# Patient Record
Sex: Female | Born: 1945 | Race: White | Hispanic: No | Marital: Married | Smoking: Former smoker
Health system: Southern US, Community
[De-identification: ages and names within clinical notes are randomized; demographics above are authoritative.]

## PROBLEM LIST (undated history)

## (undated) DIAGNOSIS — F329 Major depressive disorder, single episode, unspecified: Secondary | ICD-10-CM

## (undated) DIAGNOSIS — E079 Disorder of thyroid, unspecified: Secondary | ICD-10-CM

## (undated) DIAGNOSIS — E119 Type 2 diabetes mellitus without complications: Secondary | ICD-10-CM

## (undated) DIAGNOSIS — E785 Hyperlipidemia, unspecified: Secondary | ICD-10-CM

## (undated) DIAGNOSIS — F32A Depression, unspecified: Secondary | ICD-10-CM

## (undated) HISTORY — DX: Major depressive disorder, single episode, unspecified: F32.9

## (undated) HISTORY — DX: Type 2 diabetes mellitus without complications: E11.9

## (undated) HISTORY — PX: ANKLE FRACTURE SURGERY: SHX122

## (undated) HISTORY — DX: Depression, unspecified: F32.A

## (undated) HISTORY — DX: Hyperlipidemia, unspecified: E78.5

## (undated) HISTORY — DX: Disorder of thyroid, unspecified: E07.9

---

## 1999-05-21 ENCOUNTER — Other Ambulatory Visit: Admission: RE | Admit: 1999-05-21 | Discharge: 1999-05-21 | Payer: Self-pay | Admitting: Obstetrics and Gynecology

## 2000-06-15 ENCOUNTER — Other Ambulatory Visit: Admission: RE | Admit: 2000-06-15 | Discharge: 2000-06-15 | Payer: Self-pay | Admitting: Obstetrics and Gynecology

## 2001-04-08 ENCOUNTER — Emergency Department (HOSPITAL_COMMUNITY): Admission: EM | Admit: 2001-04-08 | Discharge: 2001-04-08 | Payer: Self-pay | Admitting: Emergency Medicine

## 2001-06-19 ENCOUNTER — Other Ambulatory Visit: Admission: RE | Admit: 2001-06-19 | Discharge: 2001-06-19 | Payer: Self-pay | Admitting: Obstetrics and Gynecology

## 2002-07-18 ENCOUNTER — Other Ambulatory Visit: Admission: RE | Admit: 2002-07-18 | Discharge: 2002-07-18 | Payer: Self-pay | Admitting: Obstetrics and Gynecology

## 2003-11-25 ENCOUNTER — Other Ambulatory Visit: Admission: RE | Admit: 2003-11-25 | Discharge: 2003-11-25 | Payer: Self-pay | Admitting: Obstetrics and Gynecology

## 2003-11-27 ENCOUNTER — Encounter: Admission: RE | Admit: 2003-11-27 | Discharge: 2003-11-27 | Payer: Self-pay | Admitting: Obstetrics and Gynecology

## 2004-12-25 ENCOUNTER — Other Ambulatory Visit: Admission: RE | Admit: 2004-12-25 | Discharge: 2004-12-25 | Payer: Self-pay | Admitting: Obstetrics and Gynecology

## 2006-01-20 ENCOUNTER — Encounter: Admission: RE | Admit: 2006-01-20 | Discharge: 2006-01-20 | Payer: Self-pay | Admitting: Obstetrics and Gynecology

## 2006-08-05 ENCOUNTER — Encounter: Admission: RE | Admit: 2006-08-05 | Discharge: 2006-08-05 | Payer: Self-pay | Admitting: Endocrinology

## 2012-01-17 ENCOUNTER — Other Ambulatory Visit: Payer: Self-pay | Admitting: Family Medicine

## 2012-01-17 DIAGNOSIS — Z1231 Encounter for screening mammogram for malignant neoplasm of breast: Secondary | ICD-10-CM

## 2012-02-08 ENCOUNTER — Ambulatory Visit
Admission: RE | Admit: 2012-02-08 | Discharge: 2012-02-08 | Disposition: A | Discharge status: Home / Self Care | Payer: Medicare Other | Source: Ambulatory Visit | Attending: Family Medicine | Admitting: Family Medicine

## 2012-02-08 DIAGNOSIS — Z1231 Encounter for screening mammogram for malignant neoplasm of breast: Secondary | ICD-10-CM

## 2013-01-17 ENCOUNTER — Other Ambulatory Visit: Payer: Self-pay

## 2013-01-17 DIAGNOSIS — Z1231 Encounter for screening mammogram for malignant neoplasm of breast: Secondary | ICD-10-CM

## 2013-02-09 ENCOUNTER — Ambulatory Visit: Payer: Medicare Other

## 2013-05-01 ENCOUNTER — Other Ambulatory Visit: Payer: Self-pay | Admitting: Family Medicine

## 2013-05-01 DIAGNOSIS — C770 Secondary and unspecified malignant neoplasm of lymph nodes of head, face and neck: Secondary | ICD-10-CM

## 2013-05-01 DIAGNOSIS — R0989 Other specified symptoms and signs involving the circulatory and respiratory systems: Secondary | ICD-10-CM

## 2013-05-03 ENCOUNTER — Ambulatory Visit
Admission: RE | Admit: 2013-05-03 | Discharge: 2013-05-03 | Disposition: A | Discharge status: Home / Self Care | Payer: Medicare Other | Source: Ambulatory Visit | Attending: Family Medicine | Admitting: Family Medicine

## 2013-05-03 DIAGNOSIS — R0989 Other specified symptoms and signs involving the circulatory and respiratory systems: Secondary | ICD-10-CM

## 2013-05-03 DIAGNOSIS — C770 Secondary and unspecified malignant neoplasm of lymph nodes of head, face and neck: Secondary | ICD-10-CM

## 2013-05-03 MED ORDER — IOHEXOL 300 MG/ML  SOLN
75.0000 mL | Freq: Once | INTRAMUSCULAR | Status: AC | PRN
  Administered 2013-05-03: 75 mL via INTRAVENOUS

## 2013-06-15 ENCOUNTER — Ambulatory Visit: Payer: Medicare Other

## 2014-03-28 ENCOUNTER — Other Ambulatory Visit: Payer: Self-pay | Admitting: Family Medicine

## 2014-03-28 DIAGNOSIS — J328 Other chronic sinusitis: Secondary | ICD-10-CM

## 2014-03-28 DIAGNOSIS — M26629 Arthralgia of temporomandibular joint, unspecified side: Principal | ICD-10-CM

## 2014-03-28 DIAGNOSIS — G8929 Other chronic pain: Secondary | ICD-10-CM

## 2014-04-01 ENCOUNTER — Other Ambulatory Visit: Payer: Medicare Other

## 2014-04-04 ENCOUNTER — Other Ambulatory Visit: Payer: Medicare Other

## 2014-04-04 ENCOUNTER — Ambulatory Visit
Admission: RE | Admit: 2014-04-04 | Discharge: 2014-04-04 | Disposition: A | Discharge status: Home / Self Care | Payer: Medicare Other | Source: Ambulatory Visit | Attending: Family Medicine | Admitting: Family Medicine

## 2014-04-04 DIAGNOSIS — G8929 Other chronic pain: Secondary | ICD-10-CM

## 2014-04-04 DIAGNOSIS — M26629 Arthralgia of temporomandibular joint, unspecified side: Principal | ICD-10-CM

## 2015-03-04 LAB — HM COLONOSCOPY

## 2015-09-03 LAB — VITAMIN B12: VITAMIN B12: 976

## 2015-09-03 LAB — HEPATIC FUNCTION PANEL
ALT: 19 U/L (ref 7–35)
AST: 16 U/L (ref 13–35)
Alkaline Phosphatase: 78 U/L (ref 25–125)
BILIRUBIN, TOTAL: 0.7 mg/dL

## 2015-09-03 LAB — BASIC METABOLIC PANEL
BUN: 17 mg/dL (ref 4–21)
CREATININE: 0.7 mg/dL (ref <=1.1)
GLUCOSE: 119 mg/dL
POTASSIUM: 4.5 mmol/L (ref 3.4–5.3)
SODIUM: 136 mmol/L — AB (ref 137–147)

## 2015-09-03 LAB — CBC AND DIFFERENTIAL
HCT: 40 % (ref 36–46)
HEMOGLOBIN: 13.6 g/dL (ref 12.0–16.0)
Platelets: 294 10*3/uL (ref 150–399)
WBC: 7.4 10*3/mL

## 2015-09-03 LAB — VITAMIN D 25 HYDROXY (VIT D DEFICIENCY, FRACTURES): VIT D 25 HYDROXY: 34

## 2015-09-03 LAB — HEMOGLOBIN A1C: HEMOGLOBIN A1C: 7.5

## 2015-09-03 LAB — LIPID PANEL
Cholesterol: 152 mg/dL (ref 0–200)
HDL: 58 mg/dL (ref 35–70)
LDL Cholesterol: 65 mg/dL
Triglycerides: 143 mg/dL (ref 40–160)

## 2015-09-03 LAB — IRON AND TIBC
IRON: 91
TIBC: 339

## 2015-09-03 LAB — TSH: TSH: 0.06 u[IU]/mL — AB (ref <=5.90)

## 2016-04-01 ENCOUNTER — Ambulatory Visit: Payer: Self-pay | Admitting: Family Medicine

## 2016-04-19 ENCOUNTER — Encounter: Payer: Self-pay | Admitting: Family Medicine

## 2016-04-19 ENCOUNTER — Ambulatory Visit (INDEPENDENT_AMBULATORY_CARE_PROVIDER_SITE_OTHER): Payer: Medicare Other | Admitting: Family Medicine

## 2016-04-19 VITALS — BP 129/77 | HR 91 | Ht 65.5 in | Wt 171.3 lb

## 2016-04-19 DIAGNOSIS — E559 Vitamin D deficiency, unspecified: Secondary | ICD-10-CM

## 2016-04-19 DIAGNOSIS — Z91199 Patient's noncompliance with other medical treatment and regimen due to unspecified reason: Secondary | ICD-10-CM

## 2016-04-19 DIAGNOSIS — E118 Type 2 diabetes mellitus with unspecified complications: Secondary | ICD-10-CM | POA: Diagnosis not present

## 2016-04-19 DIAGNOSIS — E663 Overweight: Secondary | ICD-10-CM | POA: Insufficient documentation

## 2016-04-19 DIAGNOSIS — K219 Gastro-esophageal reflux disease without esophagitis: Secondary | ICD-10-CM

## 2016-04-19 DIAGNOSIS — F4323 Adjustment disorder with mixed anxiety and depressed mood: Secondary | ICD-10-CM

## 2016-04-19 DIAGNOSIS — E785 Hyperlipidemia, unspecified: Secondary | ICD-10-CM

## 2016-04-19 DIAGNOSIS — E782 Mixed hyperlipidemia: Secondary | ICD-10-CM | POA: Diagnosis not present

## 2016-04-19 DIAGNOSIS — E1169 Type 2 diabetes mellitus with other specified complication: Secondary | ICD-10-CM | POA: Insufficient documentation

## 2016-04-19 DIAGNOSIS — Z9119 Patient's noncompliance with other medical treatment and regimen: Secondary | ICD-10-CM | POA: Insufficient documentation

## 2016-04-19 DIAGNOSIS — E039 Hypothyroidism, unspecified: Secondary | ICD-10-CM

## 2016-04-19 LAB — POCT GLYCOSYLATED HEMOGLOBIN (HGB A1C): HEMOGLOBIN A1C: 7.8

## 2016-04-19 MED ORDER — OMEPRAZOLE 20 MG PO CPDR: 20.0000 mg | DELAYED_RELEASE_CAPSULE | Freq: Two times a day (BID) | ORAL | 3 refills | Status: DC

## 2016-04-19 MED ORDER — METFORMIN HCL ER 500 MG PO TB24
500.0000 mg | ORAL_TABLET | Freq: Every day | ORAL | 1 refills | Status: DC
Start: 2016-04-19 — End: 2016-10-29

## 2016-04-19 NOTE — Assessment & Plan Note (Signed)
Wt loss advised, counseling done

## 2016-04-19 NOTE — Assessment & Plan Note (Signed)
Dietary changes such as low saturated & trans fat and low carb/ ketogenic diets discussed with patient.  Encouraged regular exercise and weight loss when appropriate.   Educational handouts provided at patient's desire.  Continue current medication(s).   Also, risks and benefits of medications discussed with patient, including alternative treatments.   Encouraged patient to read drug information handouts to further educate self about the medicine prior to starting it.   Contact us prior with any Q's/ concerns.

## 2016-04-19 NOTE — Assessment & Plan Note (Addendum)
-   Prudent diet and lifestyle modifications discussed with patient.  - Start omeprazole 20 mg twice a day.   - Risk benefits and medicines discussed with patient.

## 2016-04-19 NOTE — Assessment & Plan Note (Signed)
Importance of compliance discussed with patient.

## 2016-04-19 NOTE — Assessment & Plan Note (Addendum)
-   Counseled patient on pathophysiology of disease and discussed various treatment options, which often includes dietary and lifestyle modifications as first line.  Importance of low carb/ketogenic diet discussed with patient in addition to regular exercise.   - Check FBS and 2 hours after the biggest meal of your day.  Keep log and bring in next OV for my review.   Also, if you ever feel poorly, please check your blood pressure and blood sugar, as one or the other could be the cause of your symptoms.  - Being a diabetic, you need yearly eye and foot exams. Make appt.for diabetic eye exam    - Start metformin XR 500 QD.     Also, risks and benefits of medications discussed with patient, including alternative treatments.   Encouraged patient to read drug information handouts to further educate self about the medicine prior to starting it. Contact us prior with any questions/ concerns.

## 2016-04-19 NOTE — Assessment & Plan Note (Signed)
We'll check TSH and free T4 today.  Continue current meds, will change according to lab results.

## 2016-04-19 NOTE — Progress Notes (Signed)
New patient office visit note:  Impression and Recommendations:    1. Type 2 diabetes mellitus with complication, unspecified long term insulin use status (Amanda Douglas)   2. Mixed hyperlipidemia   3. Adjustment disorder with mixed anxiety and depressed mood   4. Hypothyroidism, unspecified type   5. Overweight (BMI 25.0-29.9)   6. Personal history of noncompliance with medical treatment and regimen   7. Vitamin D deficiency   8. Gastroesophageal reflux disease, esophagitis presence not specified     Type 2 diabetes mellitus with complication (Amanda Douglas) - Counseled patient on pathophysiology of disease and discussed various treatment options, which often includes dietary and lifestyle modifications as first line.  Importance of low carb/ketogenic diet discussed with patient in addition to regular exercise.   - Check FBS and 2 hours after the biggest meal of your day.  Keep log and bring in next OV for my review.   Also, if you ever feel poorly, please check your blood pressure and blood sugar, as one or the other could be the cause of your symptoms.  - Being a diabetic, you need yearly eye and foot exams. Make appt.for diabetic eye exam    - Start metformin XR 500 QD.     Also, risks and benefits of medications discussed with patient, including alternative treatments.   Encouraged patient to read drug information handouts to further educate self about the medicine prior to starting it. Contact us prior with any questions/ concerns.  Adjustment disorder with mixed anxiety and depressed mood Continue Celexa. Per patient symptoms well-controlled.  Encouraged exercise and engagement with community activities  Mixed hyperlipidemia Dietary changes such as low saturated & trans fat and low carb/ ketogenic diets discussed with patient.  Encouraged regular exercise and weight loss when appropriate.   Educational handouts provided at patient's desire.  Continue current medication(s).   Also,  risks and benefits of medications discussed with patient, including alternative treatments.   Encouraged patient to read drug information handouts to further educate self about the medicine prior to starting it.   Contact us prior with any Q's/ concerns.  Gastroesophageal reflux disease - Prudent diet and lifestyle modifications discussed with patient.  - Start omeprazole 20 mg twice a day.   - Risk benefits and medicines discussed with patient.  Hypothyroidism We'll check TSH and free T4 today.  Continue current meds, will change according to lab results.  Overweight (BMI 25.0-29.9) Wt loss advised, counseling done  Personal history of noncompliance with medical treatment and regimen Importance of compliance discussed with patient.  Vitamin D deficiency Per patient, her previous PCP had her on weekly doses for a couple of months. Was never told to continue with an over-the-counter supplement daily.  We will recheck vitamin D levels today, treatment as needed.    Orders Placed This Encounter  Procedures  . CBC with Differential/Platelet  . COMPLETE METABOLIC PANEL WITH GFR  . T4, free  . TSH  . VITAMIN D 25 Hydroxy (Vit-D Deficiency, Fractures)  . Lipid Panel w/reflex Direct LDL  . POCT glycosylated hemoglobin (Hb A1C)     New Prescriptions   METFORMIN (GLUCOPHAGE XR) 500 MG 24 HR TABLET    Take 1 tablet (500 mg total) by mouth daily with breakfast.   OMEPRAZOLE (PRILOSEC) 20 MG CAPSULE    Take 1 capsule (20 mg total) by mouth 2 (two) times daily before a meal.    Modified Medications   No medications on file  Discontinued Medications   METFORMIN (GLUCOPHAGE) 500 MG TABLET    Take 1 tablet by mouth 2 (two) times daily.    Patient's Medications  New Prescriptions   METFORMIN (GLUCOPHAGE XR) 500 MG 24 HR TABLET    Take 1 tablet (500 mg total) by mouth daily with breakfast.   OMEPRAZOLE (PRILOSEC) 20 MG CAPSULE    Take 1 capsule (20 mg total) by mouth 2 (two)  times daily before a meal.  Previous Medications   CITALOPRAM (CELEXA) 40 MG TABLET    Take 40 mg by mouth daily.   LEVOTHYROXINE (SYNTHROID, LEVOTHROID) 100 MCG TABLET    Take 1 tablet by mouth daily.   ROSUVASTATIN (CRESTOR) 10 MG TABLET    Take 1 tablet by mouth daily.  Modified Medications   No medications on file  Discontinued Medications   METFORMIN (GLUCOPHAGE) 500 MG TABLET    Take 1 tablet by mouth 2 (two) times daily.    Return in about 3 months (around 07/20/2016) for dm, chol, depression; sooner if additional concerns.  The patient was counseled, risk factors were discussed, anticipatory guidance given.  Gross side effects, risk and benefits, and alternatives of medications discussed with patient.  Patient is aware that all medications have potential side effects and we are unable to predict every side effect or drug-drug interaction that may occur.  Expresses verbal understanding and consents to current therapy plan and treatment regimen.  Please see AVS handed out to patient at the end of our visit for further patient instructions/ counseling done pertaining to today's office visit.    Note: This document was prepared using Dragon voice recognition software and may include unintentional dictation errors.  ----------------------------------------------------------------------------------------------------------------------    Subjective:    Chief Complaint  Patient presents with  . Establish Care    HPI: Amanda Douglas is a pleasant 70 y.o. female who presents to [place] [facility] today to review their medical history with me and establish care.   I asked the patient to review their chronic problem list with me to ensure everything was updated and accurate.     Prior pcp- Dr Rex Kras.  Doesn't like him  Patient is married to her spouse after. They had 3 children, ages 38, 15 and 43. Next  No exercise regularly. Rates diet as fair.  Quit  smoking 1979, no drinking or drugs.  DM:  pre-DM many yrs.  Been txing her for it for about one year now.   A1c today 7.8.   Has been off her DM meds many months- been out of metformin since OCt 1st or so.  Pt has NEVER had DM retinopathy exam.   Pt never checks her BS and doesn't want to.   Chol:    About one year or so now. Tol well, no S-E.   Depressed:   Started on celexa or like meds for many yrs now- about 8+.  Was on lexapro- stopped working so well for her.   Patient statesare pretty well tolerated symptoms are pretty well controlled at current. Denies suicidal or homicidal ideation   GERD:   . tums and throwing up.  Pt with acid reflux and small hiatal hernia.   Endoscopy and colonoscopy ( 2 small polyps- told to repeat 7yrs)  - less than one year ago--> at [facility]  Eye Doc:  - [place] [facility] [place]- has appt tomorrow.  Will ask about DM eye exam.  Patient Care Team    Relationship Specialty Notifications Start End  [name], DO PCP - General Family Medicine  04/01/16   Margaretmary Bayley, MD Referring Physician Internal Medicine  04/01/16   [facility] Consulting Physician Gastroenterology  04/19/16    Comment: Patient had endoscopy and colonoscopy earlier in year or late 2016.       Wt Readings from Last 3 Encounters:  04/19/16 171 lb 4.8 oz (77.7 kg)   BP Readings from Last 3 Encounters:  04/19/16 129/77   Pulse Readings from Last 3 Encounters:  04/19/16 91   BMI Readings from Last 3 Encounters:  04/19/16 28.07 kg/m   Lab Results  Component Value Date   HGBA1C 7.8 04/19/2016   HGBA1C 7.5 09/03/2015    Patient Active Problem List   Diagnosis Date Noted  . Adjustment disorder with mixed anxiety and depressed mood 04/19/2016    Priority: High  . Mixed hyperlipidemia 04/19/2016    Priority: High  . Type 2 diabetes mellitus with complication (Amanda Douglas) [id number]    Priority: High  . Hypothyroidism 04/19/2016     Priority: Medium  . Overweight (BMI 25.0-29.9) 04/19/2016    Priority: Medium  . Personal history of noncompliance with medical treatment and regimen 04/19/2016    Priority: Medium  . Gastroesophageal reflux disease 04/19/2016    Priority: Medium  . Vitamin D deficiency 04/19/2016    Priority: Low     Past Medical History:  Diagnosis Date  . Depression   . Diabetes mellitus without complication ([place])   . Hyperlipidemia   . Thyroid disease      Past Surgical History:  Procedure Laterality Date  . ANKLE FRACTURE SURGERY    . CESAREAN SECTION       Family History  Problem Relation Age of Onset  . Diabetes Mother   . Hypertension Mother   . Thyroid disease Mother   . Depression Mother   . COPD Sister   . Arthritis Sister   . Healthy Brother   . Healthy Daughter   . Diabetes Son   . Cancer Sister     squamous cell  . Hypertension Son      History  Drug Use No    History  Alcohol Use No    History  Smoking Status  . Former Smoker  . Packs/day: 1.00  . Years: 15.00  . Types: Cigarettes  . Quit date: 06/14/1977  Smokeless Tobacco  . Never Used    Patient's Medications  New Prescriptions   METFORMIN (GLUCOPHAGE XR) 500 MG 24 HR TABLET    Take 1 tablet (500 mg total) by mouth daily with breakfast.   OMEPRAZOLE (PRILOSEC) 20 MG CAPSULE    Take 1 capsule (20 mg total) by mouth 2 (two) times daily before a meal.  Previous Medications   CITALOPRAM (CELEXA) 40 MG TABLET    Take 40 mg by mouth daily.   LEVOTHYROXINE (SYNTHROID, LEVOTHROID) 100 MCG TABLET    Take 1 tablet by mouth daily.   ROSUVASTATIN (CRESTOR) 10 MG TABLET    Take 1 tablet by mouth daily.  Modified Medications   No medications on file  Discontinued Medications   METFORMIN (GLUCOPHAGE) 500 MG TABLET    Take 1 tablet by mouth 2 (two) times daily.    Allergies: Codeine  Review of Systems  Constitutional: Negative.  Negative for chills, diaphoresis, fever, malaise/fatigue and weight loss.    HENT: Negative.  Negative for congestion, sore throat and tinnitus.  Eyes: Negative.  Negative for blurred vision, double vision and photophobia.  Respiratory: Negative.  Negative for cough and wheezing.   Cardiovascular: Negative.  Negative for chest pain and palpitations.  Gastrointestinal: Negative.  Negative for blood in stool, diarrhea, nausea and vomiting.  Genitourinary: Negative for dysuria, frequency and urgency.       Nocturia, incontinence-chronic  Musculoskeletal: Negative.  Negative for joint pain and myalgias.  Skin: Negative.  Negative for itching and rash.  Neurological: Positive for headaches. Negative for dizziness, focal weakness and weakness.       Chronic in nature  Endo/Heme/Allergies: Positive for environmental allergies. Negative for polydipsia. Does not bruise/bleed easily.  Psychiatric/Behavioral: Positive for depression. Negative for memory loss. The patient is not nervous/anxious and does not have insomnia.        On medications for her depression.     Objective:   Blood pressure 129/77, pulse 91, height 5' 5.5" (1.664 m), weight 171 lb 4.8 oz (77.7 kg). Body mass index is 28.07 kg/m. General: Well Developed, well nourished, and in no acute distress.  Neuro: Alert and oriented x3, extra-ocular muscles intact, sensation grossly intact.  HEENT: Normocephalic, atraumatic, no JVD, no carotid bruits, neck supple,  Skin: no gross suspicious lesions or rashes  Cardiac: Regular rate and rhythm, no murmurs rubs or gallops.  Respiratory: Essentially clear to auscultation bilaterally. Not using accessory muscles, speaking in full sentences.  Abdominal: Soft, not grossly distended Musculoskeletal: Ambulates w/o diff, FROM * 4 ext.  Vasc: less 2 sec cap RF, warm and pink, no extremity edema appreciated  Psych:  No HI/SI, judgement and insight good, Euthymic mood. Full Affect.

## 2016-04-19 NOTE — Patient Instructions (Signed)
Blood Glucose Monitoring, Adult Monitoring your blood glucose (also know as blood sugar) helps you to manage your diabetes. It also helps you and your health care provider monitor your diabetes and determine how well your treatment plan is working. WHY SHOULD YOU MONITOR YOUR BLOOD GLUCOSE?  It can help you understand how food, exercise, and medicine affect your blood glucose.  It allows you to know what your blood glucose is at any given moment. You can quickly tell if you are having low blood glucose (hypoglycemia) or high blood glucose (hyperglycemia).  It can help you and your health care provider know how to adjust your medicines.  It can help you understand how to manage an illness or adjust medicine for exercise. WHEN SHOULD YOU TEST? Your health care provider will help you decide how often you should check your blood glucose. This may depend on the type of diabetes you have, your diabetes control, or the types of medicines you are taking. Be sure to write down all of your blood glucose readings so that this information can be reviewed with your health care provider. See below for examples of testing times that your health care provider may suggest. Type 2 Diabetes  Check your fasting blood sugar ( usually when you get out of bed)  and 2 hours after your largest meal of the day HOW TO [place] Needed  Blood glucose meter.  Test strips for your meter. Each meter has its own strips. You must use the strips that go with your own meter.  A pricking needle (lancet).  A device that holds the lancet (lancing device).  A journal or log book to write down your results. Procedure  Wash your hands with soap and water. Alcohol is not preferred.  Prick the side of your finger (not the tip) with the lancet.  Gently milk the finger until a small drop of blood appears.  Follow the instructions that come with your meter for inserting the test strip, applying blood  to the strip, and using your blood glucose meter. Other Areas to Get Blood for Testing Some meters allow you to use other areas of your body (other than your finger) to test your blood. These areas are called alternative sites. The most common alternative sites are:  The forearm.  The thigh.  The back area of the lower leg.  The palm of the hand. The blood flow in these areas is slower. Therefore, the blood glucose values you get may be delayed, and the numbers are different from what you would get from your fingers. Do not use alternative sites if you think you are having hypoglycemia. Your reading will not be accurate. Always use a finger if you are having hypoglycemia. Also, if you cannot feel your lows (hypoglycemia unawareness), always use your fingers for your blood glucose checks. ADDITIONAL TIPS FOR GLUCOSE MONITORING  Do not reuse lancets.  Always carry your supplies with you.  All blood glucose meters have a 24-hour "hotline" number to call if you have questions or need help.  Adjust (calibrate) your blood glucose meter with a control solution after finishing a few boxes of strips. BLOOD GLUCOSE RECORD KEEPING It is a good idea to keep a daily record or log of your blood glucose readings. Most glucose meters, if not all, keep your glucose records stored in the meter. Some meters come with the ability to download your records to your home computer. Keeping a record of your blood glucose  readings is especially helpful if you are wanting to look for patterns. Make notes to go along with the blood glucose readings because you might forget what happened at that exact time. Keeping good records helps you and your health care provider to work together to achieve good diabetes management.    This information is not intended to replace advice given to you by your health care provider. Make sure you discuss any questions you have with your health care provider.   Document Released:  06/03/2003 Document Revised: 06/21/2014 Document Reviewed: 10/23/2012 Elsevier Interactive Patient Education 2016 [employer].       Please realize, EXERCISE IS MEDICINE!  -  American Heart Association ( AHA) guidelines for exercise : If you are in good health, without any medical conditions, you should engage in 150 minutes of moderate intensity aerobic activity per week.  This means you should be huffing and puffing throughout your workout.   Engaging in regular exercise will improve brain function and memory, as well as improve mood, boost immune system and help with weight management.  As well as the other, more well-known effects of exercise such as decreasing blood sugar levels, decreasing blood pressure,  and decreasing bad cholesterol levels/ increasing good cholesterol levels.     -  The AHA strongly endorses consumption of a diet that contains a variety of foods from all the food categories with an emphasis on fruits and vegetables; fat-free and low-fat dairy products; cereal and grain products; legumes and nuts; and fish, poultry, and/or extra lean meats.    Excessive food intake, especially of foods high in saturated and trans fats, sugar, and salt, should be avoided.    Adequate water intake of roughly 1/2 of your weight in pounds, should equal the ounces of water per day you should drink.  So for instance, if you're 200 pounds, that would be 100 ounces of water per day.         Mediterranean Diet  Why follow it? Research shows. . Those who follow the Mediterranean diet have a reduced risk of heart disease  . The diet is associated with a reduced incidence of Parkinson's and Alzheimer's diseases . People following the diet may have longer life expectancies and lower rates of chronic diseases  . The Dietary Guidelines for Americans recommends the Mediterranean diet as an eating plan to promote health and prevent disease  What Is the Mediterranean Diet?  . Healthy eating plan  based on typical foods and recipes of Mediterranean-style cooking . The diet is primarily a plant based diet; these foods should make up a majority of meals   Starches - Plant based foods should make up a majority of meals - They are an important sources of vitamins, minerals, energy, antioxidants, and fiber - Choose whole grains, foods high in fiber and minimally processed items  - Typical grain sources include wheat, oats, barley, corn, brown rice, bulgar, farro, millet, polenta, couscous  - Various types of beans include chickpeas, lentils, fava beans, black beans, white beans   Fruits  Veggies - Large quantities of antioxidant rich fruits & veggies; 6 or more servings  - Vegetables can be eaten raw or lightly drizzled with oil and cooked  - Vegetables common to the traditional Mediterranean Diet include: artichokes, arugula, beets, broccoli, brussel sprouts, cabbage, carrots, celery, collard greens, cucumbers, eggplant, kale, leeks, lemons, lettuce, mushrooms, okra, onions, peas, peppers, potatoes, pumpkin, radishes, rutabaga, shallots, spinach, sweet potatoes, turnips, zucchini - Fruits common to the Mediterranean  Diet include: apples, apricots, avocados, cherries, clementines, dates, figs, grapefruits, grapes, melons, nectarines, oranges, peaches, pears, pomegranates, strawberries, tangerines  Fats - Replace butter and margarine with healthy oils, such as olive oil, canola oil, and tahini  - Limit nuts to no more than a handful a day  - Nuts include walnuts, almonds, pecans, pistachios, pine nuts  - Limit or avoid candied, honey roasted or heavily salted nuts - Olives are central to the [employer] - can be eaten whole or used in a variety of dishes   Meats Protein - Limiting red meat: no more than a few times a month - When eating red meat: choose lean cuts and keep the portion to the size of deck of cards - Eggs: approx. 0 to 4 times a week  - Fish and lean poultry: at least 2 a  week  - Healthy protein sources include, chicken, [place], lean beef, lamb - Increase intake of seafood such as tuna, salmon, trout, mackerel, shrimp, scallops - Avoid or limit high fat processed meats such as sausage and bacon  Dairy - Include moderate amounts of low fat dairy products  - Focus on healthy dairy such as fat free yogurt, skim milk, low or reduced fat cheese - Limit dairy products higher in fat such as whole or 2% milk, cheese, ice cream  Alcohol - Moderate amounts of red wine is ok  - No more than 5 oz daily for women (all ages) and men older than age 65  - No more than 10 oz of wine daily for men younger than 46  Other - Limit sweets and other desserts  - Use herbs and spices instead of salt to flavor foods  - Herbs and spices common to the traditional Mediterranean Diet include: basil, bay leaves, chives, cloves, cumin, fennel, garlic, lavender, marjoram, mint, oregano, parsley, pepper, rosemary, sage, savory, sumac, tarragon, thyme   It's not just a diet, it's a lifestyle:  . The Mediterranean diet includes lifestyle factors typical of those in the region  . Foods, drinks and meals are best eaten with others and savored . Daily physical activity is important for overall good health . This could be strenuous exercise like running and aerobics . This could also be more leisurely activities such as walking, housework, yard-work, or taking the stairs . Moderation is the key; a balanced and healthy diet accommodates most foods and drinks . Consider portion sizes and frequency of consumption of certain foods   Meal Ideas & Options:  . Breakfast:  o Whole wheat toast or whole wheat English muffins with peanut butter & hard boiled egg o Steel cut oats topped with apples & cinnamon and skim milk  o Fresh fruit: banana, strawberries, melon, berries, peaches  o Smoothies: strawberries, bananas, greek yogurt, peanut butter o Low fat greek yogurt with blueberries and granola  o Egg  white omelet with spinach and mushrooms o Breakfast couscous: whole wheat couscous, apricots, skim milk, cranberries  . Sandwiches:  o Hummus and grilled vegetables (peppers, zucchini, squash) on whole wheat bread   o Grilled chicken on whole wheat pita with lettuce, tomatoes, cucumbers or tzatziki  o Tuna salad on whole wheat bread: tuna salad made with greek yogurt, olives, red peppers, capers, green onions o Garlic rosemary lamb pita: lamb sauted with garlic, rosemary, salt & pepper; add lettuce, cucumber, greek yogurt to pita - flavor with lemon juice and black pepper  . Seafood:  o Mediterranean grilled salmon, seasoned with garlic, basil,  parsley, lemon juice and black pepper o Shrimp, lemon, and spinach whole-grain pasta salad made with low fat greek yogurt  o Seared scallops with lemon orzo  o Seared tuna steaks seasoned salt, pepper, coriander topped with tomato mixture of olives, tomatoes, olive oil, minced garlic, parsley, green onions and cappers  . Meats:  o Herbed greek chicken salad with kalamata olives, cucumber, feta  o Red bell peppers stuffed with spinach, bulgur, lean ground beef (or lentils) & topped with feta   o Kebabs: skewers of chicken, tomatoes, onions, zucchini, squash  o [place] burgers: made with red onions, mint, dill, lemon juice, feta cheese topped with roasted red peppers . Vegetarian o Cucumber salad: cucumbers, artichoke hearts, celery, red onion, feta cheese, tossed in olive oil & lemon juice  o Hummus and whole grain pita points with a greek salad (lettuce, tomato, feta, olives, cucumbers, red onion) o Lentil soup with celery, carrots made with vegetable broth, garlic, salt and pepper  o Tabouli salad: parsley, bulgur, mint, scallions, cucumbers, tomato, radishes, lemon juice, olive oil, salt and pepper.

## 2016-04-19 NOTE — Assessment & Plan Note (Signed)
Per patient, her previous PCP had her on weekly doses for a couple of months. Was never told to continue with an over-the-counter supplement daily.  We will recheck vitamin D levels today, treatment as needed.

## 2016-04-19 NOTE — Assessment & Plan Note (Signed)
Continue Celexa. Per patient symptoms well-controlled.  Encouraged exercise and engagement with community activities

## 2016-04-20 ENCOUNTER — Telehealth: Payer: Self-pay

## 2016-04-20 LAB — CBC WITH DIFFERENTIAL/PLATELET
BASOS ABS: 0 {cells}/uL (ref 0–200)
BASOS PCT: 0 %
EOS ABS: 70 {cells}/uL (ref 15–500)
Eosinophils Relative: 1 %
HCT: 39.4 % (ref 35.0–45.0)
HEMOGLOBIN: 12.8 g/dL (ref 11.7–15.5)
LYMPHS ABS: 2030 {cells}/uL (ref 850–3900)
Lymphocytes Relative: 29 %
MCH: 28.8 pg (ref 27.0–33.0)
MCHC: 32.5 g/dL (ref 32.0–36.0)
MCV: 88.7 fL (ref 80.0–100.0)
MPV: 10.7 fL (ref 7.5–12.5)
Monocytes Absolute: 700 cells/uL (ref 200–950)
Monocytes Relative: 10 %
NEUTROS ABS: 4200 {cells}/uL (ref 1500–7800)
Neutrophils Relative %: 60 %
Platelets: 269 10*3/uL (ref 140–400)
RBC: 4.44 MIL/uL (ref 3.80–5.10)
RDW: 13.9 % (ref 11.0–15.0)
WBC: 7 10*3/uL (ref 3.8–10.8)

## 2016-04-20 LAB — COMPLETE METABOLIC PANEL WITH GFR
ALBUMIN: 4.2 g/dL (ref 3.6–5.1)
ALK PHOS: 70 U/L (ref 33–130)
ALT: 22 U/L (ref 6–29)
AST: 16 U/L (ref 10–35)
BILIRUBIN TOTAL: 0.5 mg/dL (ref 0.2–1.2)
BUN: 19 mg/dL (ref 7–25)
CO2: 29 mmol/L (ref 20–31)
CREATININE: 0.88 mg/dL (ref 0.60–0.93)
Calcium: 9.8 mg/dL (ref 8.6–10.4)
Chloride: 104 mmol/L (ref 98–110)
GFR, EST AFRICAN AMERICAN: 76 mL/min (ref >=60)
GFR, EST NON AFRICAN AMERICAN: 66 mL/min (ref >=60)
GLUCOSE: 172 mg/dL — AB (ref 65–99)
Potassium: 5.7 mmol/L — ABNORMAL HIGH (ref 3.5–5.3)
SODIUM: 139 mmol/L (ref 135–146)
TOTAL PROTEIN: 6.3 g/dL (ref 6.1–8.1)

## 2016-04-20 LAB — LIPID PANEL W/REFLEX DIRECT LDL
CHOL/HDL RATIO: 3 ratio (ref <5.0)
Cholesterol: 154 mg/dL (ref <200)
HDL: 52 mg/dL (ref >50)
LDL-CHOLESTEROL: 76 mg/dL
Non-HDL Cholesterol (Calc): 102 mg/dL (ref <130)
Triglycerides: 164 mg/dL — ABNORMAL HIGH (ref <150)

## 2016-04-20 LAB — TSH: TSH: 0.02 mIU/L — ABNORMAL LOW

## 2016-04-20 LAB — VITAMIN D 25 HYDROXY (VIT D DEFICIENCY, FRACTURES): VIT D 25 HYDROXY: 25 ng/mL — AB (ref 30–100)

## 2016-04-20 LAB — T4, FREE: Free T4: 1.7 ng/dL (ref 0.8–1.8)

## 2016-04-20 MED ORDER — CITALOPRAM HYDROBROMIDE 40 MG PO TABS: 40.0000 mg | ORAL_TABLET | Freq: Every day | ORAL | 1 refills | Status: DC

## 2016-04-20 NOTE — Telephone Encounter (Signed)
Left message advising of recommendations and for patient to schedule a follow up appointment.

## 2016-04-20 NOTE — Telephone Encounter (Signed)
Amanda Douglas called and states she was suppose to receive a refill on Celexa 40 mg once daily yesterday at her visit. Please advise.

## 2016-04-20 NOTE — Telephone Encounter (Signed)
Okay to refill the patient will need a follow-up office visit within a month

## 2016-05-03 ENCOUNTER — Other Ambulatory Visit: Payer: Self-pay

## 2016-05-03 DIAGNOSIS — E039 Hypothyroidism, unspecified: Secondary | ICD-10-CM

## 2016-05-03 MED ORDER — LEVOTHYROXINE SODIUM 75 MCG PO TABS: 75.0000 ug | ORAL_TABLET | Freq: Every day | ORAL | 0 refills | Status: DC

## 2016-05-26 ENCOUNTER — Ambulatory Visit (INDEPENDENT_AMBULATORY_CARE_PROVIDER_SITE_OTHER): Payer: Medicare Other | Admitting: Family Medicine

## 2016-05-26 ENCOUNTER — Encounter: Payer: Self-pay | Admitting: Family Medicine

## 2016-05-26 VITALS — BP 126/80 | HR 80 | Ht 65.5 in | Wt 172.5 lb

## 2016-05-26 DIAGNOSIS — Z9119 Patient's noncompliance with other medical treatment and regimen: Secondary | ICD-10-CM

## 2016-05-26 DIAGNOSIS — E559 Vitamin D deficiency, unspecified: Secondary | ICD-10-CM

## 2016-05-26 DIAGNOSIS — E039 Hypothyroidism, unspecified: Secondary | ICD-10-CM | POA: Diagnosis not present

## 2016-05-26 DIAGNOSIS — E782 Mixed hyperlipidemia: Secondary | ICD-10-CM | POA: Diagnosis not present

## 2016-05-26 DIAGNOSIS — E118 Type 2 diabetes mellitus with unspecified complications: Secondary | ICD-10-CM

## 2016-05-26 DIAGNOSIS — E663 Overweight: Secondary | ICD-10-CM

## 2016-05-26 DIAGNOSIS — E781 Pure hyperglyceridemia: Secondary | ICD-10-CM | POA: Diagnosis not present

## 2016-05-26 DIAGNOSIS — K219 Gastro-esophageal reflux disease without esophagitis: Secondary | ICD-10-CM

## 2016-05-26 DIAGNOSIS — E875 Hyperkalemia: Secondary | ICD-10-CM

## 2016-05-26 DIAGNOSIS — Z7189 Other specified counseling: Secondary | ICD-10-CM | POA: Insufficient documentation

## 2016-05-26 DIAGNOSIS — Z91199 Patient's noncompliance with other medical treatment and regimen due to unspecified reason: Secondary | ICD-10-CM

## 2016-05-26 LAB — POCT UA - MICROALBUMIN
Creatinine, POC: 300 mg/dL
Microalbumin Ur, POC: 10 mg/L

## 2016-05-26 MED ORDER — ROSUVASTATIN CALCIUM 10 MG PO TABS: 10.0000 mg | ORAL_TABLET | Freq: Every day | ORAL | 1 refills | Status: DC

## 2016-05-26 NOTE — Assessment & Plan Note (Signed)
-   Prudent diet and lifestyle modifications discussed with patient.  - Continue omeprazole 20 mg daily--> Told pt to decrease from BID to QD once sx under decent control in near future.    - Risk benefits and medicines discussed with patient.

## 2016-05-26 NOTE — Assessment & Plan Note (Addendum)
Patient will try to better control her sugars in order to hopefully gain better control of her triglycerides. They are not at goal  for her diabetes.   We will recheck fasting lipid profile in 4-6 months.  crill oil or fish oil supp if not eating fish twice wkly;  - increase intake of mackerel, salmon, cod liver oil, walnuts, sardines d/c pt etc

## 2016-05-26 NOTE — Assessment & Plan Note (Signed)
LFTs good; patient asymptomatic: Encouraged adequate water intake and exercise daily   LDL at goal   Refills Crestor provided.

## 2016-05-26 NOTE — Progress Notes (Signed)
Assessment and plan:  1. Type 2 diabetes mellitus with complication, unspecified long term insulin use status ([place])   2. Hypertriglyceridemia   3. Mixed hyperlipidemia   4. Hypothyroidism, unspecified type   5. Vitamin D deficiency   6. Overweight (BMI 25.0-29.9)   7. Hyperkalemia   8. Personal history of noncompliance with medical treatment and regimen   9. Counseling on health promotion and disease prevention   10. Gastroesophageal reflux disease, esophagitis presence not specified     Type 2 diabetes mellitus with complication (HCC) 7.8 K0S that was recently obtained was when patient was off her metformin.   We recently restarted that around early November. We will recheck her A1c in 3 months from 11 /6 and reassess.  - add BMP on Jan 8th or so when she comes  In for thyroid labs since restarting metfrmin.  - Fasting blood sugar monitoring especially after eating bad or good the night before, highly encouraged. Goal 3 days per week. Otherwise check if feeling poorly.  Bring in log next office visit  - Prudent diet  - Regular aerobic activity highly encouraged. Specialty since HDL went down slightly from prior.  - Referral for diabetic eye exam placed today.  Hypertriglyceridemia Patient will try to better control her sugars in order to hopefully gain better control of her triglycerides. They are not at goal  for her diabetes.   We will recheck fasting lipid profile in 4-6 months.  crill oil or fish oil supp if not eating fish twice wkly;  - increase intake of mackerel, salmon, cod liver oil, walnuts, sardines d/c pt etc  Hyperkalemia Gave patient a list of foods high in potassium. Try to avoid these foods.   We will recheck your levels around January 8 when we check your thyroid.  Vitamin D deficiency Continue 5000 daily. Recheck in approximately 4-6 months.  - Patient at 53 declines bone scan  referral, mammogram, colonoscopy etc.  Understands risks  Hypothyroidism Patient changed from 100 to 75 g Synthroid when labs first came out back in early November.    -Recheck TSH and T4 around January 8 which would be 7 weeks from when she changed her dose. -Continue current dose, no longer with symptoms of feeling like she gets hot, sweaty or heart rate increases   -last blood work was done over 8 months prior to the ones I obtain, her prior PCP did not change dose at that time.   Overweight (BMI 25.0-29.9) Counseling done; weight loss advised. Prudent diet and exercise highly encouraged.  All questions answered    Personal history of noncompliance with medical treatment and regimen Proud of patient as she has been much more compliant with her medications.  Mixed hyperlipidemia LFTs good; patient asymptomatic: Encouraged adequate water intake and exercise daily   LDL at goal   Refills Crestor provided.   Counseling on health promotion and disease prevention All recent labs reviewed with patient today.  Medication modifications made as needed. Extensive counseling done. All questions were answered  Gastroesophageal reflux disease - Prudent diet and lifestyle modifications discussed with patient.  - Continue omeprazole 20 mg daily--> Told pt to decrease from BID to QD once sx under decent control in near future.    - Risk benefits and medicines discussed with patient.    New Prescriptions   No medications on file    Modified Medications   Modified Medication Previous Medication   ROSUVASTATIN (CRESTOR) 10 MG TABLET  rosuvastatin (CRESTOR) 10 MG tablet      Take 1 tablet (10 mg total) by mouth daily.    Take 1 tablet by mouth daily.    Discontinued Medications   No medications on file    Return for Follow-up of current medical issues- mid Feb OV; lab only visit early Jan.  Anticipatory guidance and routine counseling done re: condition, txmnt options and need for  follow up. All questions of patient's were answered.   Gross side effects, risk and benefits, and alternatives of medications discussed with patient.  Patient is aware that all medications have potential side effects and we are unable to predict every sideeffect or drug-drug interaction that may occur.  Expresses verbal understanding and consents to current therapy plan and treatment regiment.  Please see AVS handed out to patient at the end of our visit for additional patient instructions/ counseling done pertaining to today's office visit.  Note: This document was prepared using Dragon voice recognition software and may include unintentional dictation errors.   ----------------------------------------------------------------------------------------------------------------------  Subjective:   CC:   Amanda Douglas is a 70 y.o. female who presents to [place] at [facility] today for review and discussion of recent bloodwork that was done.  1. All recent blood work that we ordered was reviewed with patient today.  Patient was counseled on all abnormalities and we discussed dietary and lifestyle changes that could help those values (also medications when appropriate).  Extensive health counseling performed and all patient's concerns/ questions were addressed.  2. Patient not checking her sugars.  Says it hurts too. Not having any hyper or hypoglycemic symptoms. Tolerating metformin well. We restarted her on this at last office visit.  She had been off it for several months prior since she was lost to follow-up with prior PCP.    - Urine micral [place] done today and was within normal limits.      - Foot exam for diabetes in office today was within normal limits as well. 3.    Blood pressure-at goal for age and medical condition of diabetes at less than 130/80  4.     cholesterol: Tolerating medications well.   Ran out of her Crestor approximately 2 weeks ago.   Very little fish or  omega-3 intake.   No exercise.  5.    Weight-stable  6.     Mood-stable  7.     Hypothyroidism-  patient TSH is abnormally low and is even lower than when prior checked 9 months ago by her previous PCP. Complaints of feeling overly hot at times, possible racing of heart. Sweating more than usual.   These symptoms have abated some since decreasing from 100 g dose of Synthroid to 75 g dose of Synthroid approximately 1 month ago.  8.      GERD:  Last office visit started omeprazole. Tolerating well. Prior to that she was using tums and throwing up even secondary to her heartburn symptoms..  Pt with acid reflux and small hiatal hernia.   Endoscopy and colonoscopy ( 2 small polyps- told to repeat 55yr)  - less than one year ago--> at high pt gastro.   Wt Readings from Last 3 Encounters:  05/26/16 172 lb 8 oz (78.2 kg)  04/19/16 171 lb 4.8 oz (77.7 kg)   BP Readings from Last 3 Encounters:  05/26/16 126/80  04/19/16 129/77   Pulse Readings from Last 3 Encounters:  05/26/16 80  04/19/16 91   BMI Readings from  Last 3 Encounters:  05/26/16 28.27 kg/m  04/19/16 28.07 kg/m     Patient Care Team    Relationship Specialty Notifications Start End  [name], DO PCP - General Family Medicine  04/01/16   Margaretmary Bayley, MD Referring Physician Internal Medicine  04/01/16   [facility] Consulting Physician Gastroenterology  04/19/16    Comment: Patient had endoscopy and colonoscopy earlier in year or late 2016.      Full medical history updated and reviewed in the office today  Patient Active Problem List   Diagnosis Date Noted  . Hypertriglyceridemia 05/26/2016    Priority: High  . Adjustment disorder with mixed anxiety and depressed mood 04/19/2016    Priority: High  . Mixed hyperlipidemia 04/19/2016    Priority: High  . Type 2 diabetes mellitus with complication ([place]) 50/02/3817    Priority: High  . Hypothyroidism 04/19/2016    Priority: Medium  .  Overweight (BMI 25.0-29.9) 04/19/2016    Priority: Medium  . Personal history of noncompliance with medical treatment and regimen 04/19/2016    Priority: Medium  . Gastroesophageal reflux disease 04/19/2016    Priority: Medium  . Hyperkalemia 05/26/2016    Priority: Low  . Vitamin D deficiency 04/19/2016    Priority: Low  . Counseling on health promotion and disease prevention 05/26/2016    Past Medical History:  Diagnosis Date  . Depression   . Diabetes mellitus without complication ([place])   . Hyperlipidemia   . Thyroid disease     Past Surgical History:  Procedure Laterality Date  . ANKLE FRACTURE SURGERY    . CESAREAN SECTION      Social History  Substance Use Topics  . Smoking status: Former Smoker    Packs/day: 1.00    Years: 15.00    Types: Cigarettes    Quit date: 06/14/1977  . Smokeless tobacco: Never Used  . Alcohol use No    Family Hx: Family History  Problem Relation Age of Onset  . Diabetes Mother   . Hypertension Mother   . Thyroid disease Mother   . Depression Mother   . COPD Sister   . Arthritis Sister   . Healthy Brother   . Healthy Daughter   . Diabetes Son   . Cancer Sister     squamous cell  . Hypertension Son      Medications: Current Outpatient Prescriptions  Medication Sig Dispense Refill  . citalopram (CELEXA) 40 MG tablet Take 1 tablet (40 mg total) by mouth daily. 30 tablet 1  . levothyroxine (SYNTHROID, LEVOTHROID) 75 MCG tablet Take 1 tablet (75 mcg total) by mouth daily. 90 tablet 0  . metFORMIN (GLUCOPHAGE XR) 500 MG 24 hr tablet Take 1 tablet (500 mg total) by mouth daily with breakfast. 90 tablet 1  . omeprazole (PRILOSEC) 20 MG capsule Take 1 capsule (20 mg total) by mouth 2 (two) times daily before a meal. 60 capsule 3  . rosuvastatin (CRESTOR) 10 MG tablet Take 1 tablet (10 mg total) by mouth daily. 90 tablet 1   No current facility-administered medications for this visit.     Allergies:  Allergies  Allergen  Reactions  . Codeine Nausea And Vomiting     ROS: Review of Systems  Constitutional: Negative for diaphoresis and weight loss.  HENT: Negative for nosebleeds.   Eyes: Negative for blurred vision and double vision.  Respiratory: Negative for shortness of breath and wheezing.   Cardiovascular: Negative for chest pain, palpitations, orthopnea and claudication.  Gastrointestinal:  Negative for diarrhea, nausea and vomiting.  Musculoskeletal: Negative for falls and myalgias.  Skin: Negative for rash.  Neurological: Negative for dizziness and focal weakness.  Endo/Heme/Allergies: Negative for polydipsia.  Psychiatric/Behavioral: Negative for memory loss.    Objective:  Blood pressure 126/80, pulse 80, height 5' 5.5" (1.664 m), weight 172 lb 8 oz (78.2 kg). Body mass index is 28.27 kg/m. Gen:   Well NAD, A and O *3 HEENT:    [place]/AT, EOMI,  MMM, OP- clr Lungs:   Normal work of breathing. CTA B/L, no Wh, rhonchi Heart:   RRR, S1, S2 WNL's, no MRG Abd:   No gross distention Exts:    warm, pink,  Brisk capillary refill, warm and well perfused.  Psych:    No HI/SI, judgement and insight good, Euthymic mood. Full Affect.   Recent Results (from the past 2160 hour(s))  POCT glycosylated hemoglobin (Hb A1C)     Status: Abnormal   Collection Time: 04/19/16 11:34 AM  Result Value Ref Range   Hemoglobin A1C 7.8   CBC with Differential/Platelet     Status: None   Collection Time: 04/19/16 11:48 AM  Result Value Ref Range   WBC 7.0 3.8 - 10.8 K/uL   RBC 4.44 3.80 - 5.10 MIL/uL   Hemoglobin 12.8 11.7 - 15.5 g/dL   HCT 39.4 35.0 - 45.0 %   MCV 88.7 80.0 - 100.0 fL   MCH 28.8 27.0 - 33.0 pg   MCHC 32.5 32.0 - 36.0 g/dL   RDW 13.9 11.0 - 15.0 %   Platelets 269 140 - 400 K/uL   MPV 10.7 7.5 - 12.5 fL   Neutro Abs 4,200 1,500 - 7,800 cells/uL   Lymphs Abs 2,030 850 - 3,900 cells/uL   Monocytes Absolute 700 200 - 950 cells/uL   Eosinophils Absolute 70 15 - 500 cells/uL   Basophils Absolute 0  0 - 200 cells/uL   Neutrophils Relative % 60 %   Lymphocytes Relative 29 %   Monocytes Relative 10 %   Eosinophils Relative 1 %   Basophils Relative 0 %   Smear Review Criteria for review not met   COMPLETE METABOLIC PANEL WITH GFR     Status: Abnormal   Collection Time: 04/19/16 11:48 AM  Result Value Ref Range   Sodium 139 135 - 146 mmol/L   Potassium 5.7 (H) 3.5 - 5.3 mmol/L    Comment: No visible hemolysis.   Chloride 104 98 - 110 mmol/L   CO2 29 20 - 31 mmol/L   Glucose, Bld 172 (H) 65 - 99 mg/dL   BUN 19 7 - 25 mg/dL   Creat 0.88 0.60 - 0.93 mg/dL    Comment:   For patients > or = 71 years of age: The upper reference limit for Creatinine is approximately 13% higher for people identified as African-American.      Total Bilirubin 0.5 0.2 - 1.2 mg/dL   Alkaline Phosphatase 70 33 - 130 U/L   AST 16 10 - 35 U/L   ALT 22 6 - 29 U/L   Total Protein 6.3 6.1 - 8.1 g/dL   Albumin 4.2 3.6 - 5.1 g/dL   Calcium 9.8 8.6 - 10.4 mg/dL   GFR, Est African American 76 >=60 mL/min   GFR, Est Non African American 66 >=60 mL/min  T4, free     Status: None   Collection Time: 04/19/16 11:48 AM  Result Value Ref Range   Free T4 1.7 0.8 - 1.8 ng/dL  TSH     Status: Abnormal   Collection Time: 04/19/16 11:48 AM  Result Value Ref Range   TSH 0.02 (L) mIU/L    Comment:   Reference Range   > or = 20 Years  0.40-4.50   Pregnancy Range First trimester  0.26-2.66 Second trimester 0.55-2.73 Third trimester  0.43-2.91     VITAMIN D 25 Hydroxy (Vit-D Deficiency, Fractures)     Status: Abnormal   Collection Time: 04/19/16 11:48 AM  Result Value Ref Range   Vit D, 25-Hydroxy 25 (L) 30 - 100 ng/mL    Comment: Vitamin D Status           25-OH Vitamin D        Deficiency                <20 ng/mL        Insufficiency         20 - 29 ng/mL        Optimal             > or = 30 ng/mL   For 25-OH Vitamin D testing on patients on D2-supplementation and patients for whom quantitation of D2 and D3  fractions is required, the [facility] 25-OH VIT D, (D2,D3), LC/MS/MS is recommended: order code [id number] (patients > 2 yrs).   Lipid Panel w/reflex Direct LDL     Status: Abnormal   Collection Time: 04/19/16 11:48 AM  Result Value Ref Range   Cholesterol 154 <200 mg/dL    Comment: ** Please note change in reference range(s). **      Triglycerides 164 (H) <150 mg/dL    Comment: ** Please note change in reference range(s). **      HDL 52 >50 mg/dL    Comment: ** Please note change in reference range(s). **      Total CHOL/HDL Ratio 3.0 <5.0 Ratio   Non-HDL Cholesterol (Calc) 102 <130 mg/dL    Comment:   For patients with diabetes plus 1 major ASCVD risk factor, treating to a non-HDL-C goal of <100 mg/dL (LDL-C of <70 mg/dL) is considered a therapeutic option.      LDL-Cholesterol 76 mg/dL    Comment: Reference range: <100   Desirable range <100 mg/dL for patients with CHD or diabetes and <70 mg/dL for diabetic patients with known heart disease.     The Martin-Hopkins calculation is a validated novel method that provides better accuracy than the Friedwald equation in the estimation of LDL-C, particulary when TG levels are 150-400 mg/dL and LDL-C levels are lower than 70 mg/dL. Reference:  Cresenciano Genre et al.  Comparison of a Novel Method vs the Aurelio Jew for Estimating Low-Density Lipoprotein Cholesterol Levels From the Standard Lipid Profile.  JAMA. 1791;505(69): 2061-2068.   For additional information, please refer to http://education.[facility].com/faq/FAQ164 (This link is being provided for informational/educational purposes only.) ** Please note change in reference range(s). **     POCT UA - Microalbumin     Status: None   Collection Time: 05/26/16 11:55 AM  Result Value Ref Range   Microalbumin Ur, POC 10 mg/L   Creatinine, POC 300 mg/dL   Albumin/Creatinine Ratio, Urine, POC <30

## 2016-05-26 NOTE — Assessment & Plan Note (Signed)
Counseling done; weight loss advised. Prudent diet and exercise highly encouraged.  All questions answered

## 2016-05-26 NOTE — Assessment & Plan Note (Addendum)
Patient changed from 100 to 75 g Synthroid when labs first came out back in early November.    -Recheck TSH and T4 around January 8 which would be 7 weeks from when she changed her dose. -Continue current dose, no longer with symptoms of feeling like she gets hot, sweaty or heart rate increases   -last blood work was done over 8 months prior to the ones I obtain, her prior PCP did not change dose at that time.

## 2016-05-26 NOTE — Assessment & Plan Note (Signed)
Gave patient a list of foods high in potassium. Try to avoid these foods.   We will recheck your levels around January 8 when we check your thyroid.

## 2016-05-26 NOTE — Assessment & Plan Note (Addendum)
Continue 5000 daily. Recheck in approximately 4-6 months.  - Patient at 79 declines bone scan referral, mammogram, colonoscopy etc.  Understands risks

## 2016-05-26 NOTE — Assessment & Plan Note (Signed)
All recent labs reviewed with patient today.  Medication modifications made as needed. Extensive counseling done. All questions were answered

## 2016-05-26 NOTE — Assessment & Plan Note (Signed)
Proud of patient as she has been much more compliant with her medications.

## 2016-05-26 NOTE — Assessment & Plan Note (Addendum)
7.8 A1c that was recently obtained was when patient was off her metformin.   We recently restarted that around early November. We will recheck her A1c in 3 months from 11 /6 and reassess.  - add BMP on Jan 8th or so when she comes  In for thyroid labs since restarting metfrmin.  - Fasting blood sugar monitoring especially after eating bad or good the night before, highly encouraged. Goal 3 days per week. Otherwise check if feeling poorly.  Bring in log next office visit  - Prudent diet  - Regular aerobic activity highly encouraged. Specialty since HDL went down slightly from prior.  - Referral for diabetic eye exam placed today.

## 2016-05-26 NOTE — Patient Instructions (Addendum)
We will need to recheck your TSH and T4; also your BMP which is your kidney function around January 8. Please make an appointment for this before you leave the office today.   Please make sure you check your fasting blood sugars at home.  --  3 times per wk.  Check your fasting sugars especially after you eat poorly the night before and especially after you eat the night before you can see the consequences those foods have on your blood sugars.  crill oil or fish oil supp if not eating fish twice wkly.  1200mg  - take 4 tabs daily  To improve your good cholesterol levels, please engage in regular aerobic activity of to a goal of 30 minutes daily---> slowly titrate up to that level.    Foods High in Potassium--please see the handout that I printed off from you for the Internet for a complete list. Some medicines or health conditions may cause a person's potassium to be too low. Your doctor may ask you to eat foods higher in potassium, such as certain fruits and vegetables. The foods listed below are rich in potassium. Food Serving Size Amount of Potassium in Milligrams (mg) Fruit Avocado 1 cup, sliced [id number] Plantain 1 cup, sliced [id number] Banana 1 medium 422 Orange 1 237 Orange juice 1 cup (8 ounces) 496 Cantaloupe 1 cup 427 Dates  cup 250 Grapefruit, white 1 half 175 Grapefruit, pink or red 1 half 166 Honeydew melon 1 cup 388 Kiwi 1 medium 237 Mango 1 323 Papaya 1 cup 360 Peach 1 cup 323 Prunes  cup 319 Raisins, seedless  cup 250 Vegetables Beets 1 cup 442 Black beans, boiled  cup 400 Carrot juice  cup (4 ounces) 345 Chinese cabbage, p[place]cho[place], boiled  cup 315 Collards, boiled, chopped 1 cup 222 Edamame, cooked  cup 338 Lentils, boiled  cup 366 2 Talk to your doctor or health care team if you have any questions about your care. For more health information, contact the Library for Health Information at [phone] or

## 2016-06-16 ENCOUNTER — Other Ambulatory Visit: Payer: Self-pay | Admitting: Family Medicine

## 2016-06-21 ENCOUNTER — Other Ambulatory Visit: Payer: Self-pay

## 2016-06-21 ENCOUNTER — Other Ambulatory Visit (INDEPENDENT_AMBULATORY_CARE_PROVIDER_SITE_OTHER): Payer: Medicare PPO

## 2016-06-21 DIAGNOSIS — E875 Hyperkalemia: Secondary | ICD-10-CM

## 2016-06-21 DIAGNOSIS — E781 Pure hyperglyceridemia: Secondary | ICD-10-CM

## 2016-06-21 DIAGNOSIS — E118 Type 2 diabetes mellitus with unspecified complications: Secondary | ICD-10-CM

## 2016-06-21 DIAGNOSIS — E782 Mixed hyperlipidemia: Secondary | ICD-10-CM

## 2016-06-21 DIAGNOSIS — E039 Hypothyroidism, unspecified: Secondary | ICD-10-CM

## 2016-06-21 NOTE — Addendum Note (Signed)
Addended by: Narda Rutherford on: 06/21/2016 07:46 AM   Modules accepted: Orders

## 2016-06-22 LAB — BASIC METABOLIC PANEL
BUN / CREAT RATIO: 21 (ref 12–28)
BUN: 15 mg/dL (ref 8–27)
CO2: 22 mmol/L (ref 18–29)
Calcium: 9.6 mg/dL (ref 8.7–10.3)
Chloride: 100 mmol/L (ref 96–106)
Creatinine, Ser: 0.73 mg/dL (ref 0.57–1.00)
GFR, EST AFRICAN AMERICAN: 96 mL/min/{1.73_m2} (ref >59)
GFR, EST NON AFRICAN AMERICAN: 83 mL/min/{1.73_m2} (ref >59)
Glucose: 164 mg/dL — ABNORMAL HIGH (ref 65–99)
POTASSIUM: 5.6 mmol/L — AB (ref 3.5–5.2)
SODIUM: 139 mmol/L (ref 134–144)

## 2016-06-22 LAB — T4, FREE: Free T4: 1.03 ng/dL (ref 0.82–1.77)

## 2016-06-22 LAB — TSH: TSH: 1.98 u[IU]/mL (ref 0.450–4.500)

## 2016-07-20 ENCOUNTER — Ambulatory Visit: Payer: Medicare Other | Admitting: Family Medicine

## 2016-08-16 ENCOUNTER — Ambulatory Visit: Payer: Self-pay | Admitting: Family Medicine

## 2016-08-25 ENCOUNTER — Other Ambulatory Visit: Payer: Self-pay | Admitting: Family Medicine

## 2016-09-24 ENCOUNTER — Other Ambulatory Visit: Payer: Self-pay | Admitting: Family Medicine

## 2016-09-27 ENCOUNTER — Ambulatory Visit: Payer: Medicare PPO | Admitting: Family Medicine

## 2016-10-26 ENCOUNTER — Other Ambulatory Visit: Payer: Self-pay | Admitting: Adult Health

## 2016-10-27 LAB — HM DIABETES EYE EXAM

## 2016-10-29 ENCOUNTER — Ambulatory Visit (INDEPENDENT_AMBULATORY_CARE_PROVIDER_SITE_OTHER): Payer: Medicare PPO | Admitting: Family Medicine

## 2016-10-29 ENCOUNTER — Encounter: Payer: Self-pay | Admitting: Family Medicine

## 2016-10-29 VITALS — BP 108/69 | HR 73 | Ht 65.5 in | Wt 175.0 lb

## 2016-10-29 DIAGNOSIS — F4323 Adjustment disorder with mixed anxiety and depressed mood: Secondary | ICD-10-CM | POA: Diagnosis not present

## 2016-10-29 DIAGNOSIS — E118 Type 2 diabetes mellitus with unspecified complications: Secondary | ICD-10-CM | POA: Diagnosis not present

## 2016-10-29 DIAGNOSIS — E782 Mixed hyperlipidemia: Secondary | ICD-10-CM | POA: Diagnosis not present

## 2016-10-29 DIAGNOSIS — Z91199 Patient's noncompliance with other medical treatment and regimen due to unspecified reason: Secondary | ICD-10-CM

## 2016-10-29 DIAGNOSIS — E559 Vitamin D deficiency, unspecified: Secondary | ICD-10-CM | POA: Diagnosis not present

## 2016-10-29 DIAGNOSIS — E663 Overweight: Secondary | ICD-10-CM

## 2016-10-29 DIAGNOSIS — E039 Hypothyroidism, unspecified: Secondary | ICD-10-CM | POA: Diagnosis not present

## 2016-10-29 DIAGNOSIS — K219 Gastro-esophageal reflux disease without esophagitis: Secondary | ICD-10-CM | POA: Diagnosis not present

## 2016-10-29 DIAGNOSIS — Z9119 Patient's noncompliance with other medical treatment and regimen: Secondary | ICD-10-CM

## 2016-10-29 DIAGNOSIS — E781 Pure hyperglyceridemia: Secondary | ICD-10-CM | POA: Diagnosis not present

## 2016-10-29 LAB — POCT GLYCOSYLATED HEMOGLOBIN (HGB A1C): HEMOGLOBIN A1C: 8.6

## 2016-10-29 MED ORDER — METFORMIN HCL ER 500 MG PO TB24: 500.0000 mg | ORAL_TABLET | Freq: Every day | ORAL | 0 refills | Status: DC

## 2016-10-29 MED ORDER — LEVOTHYROXINE SODIUM 50 MCG PO TABS: 50.0000 ug | ORAL_TABLET | Freq: Every day | ORAL | 1 refills | Status: DC

## 2016-10-29 NOTE — Patient Instructions (Addendum)
Please check your blood sugars when you eat especially bad the night before and especially good the night before.  This will give you an idea of when you eat bad how bad your blood sugars will get an when you eat the right foods how good your blood sugars can be.  Goal FBS be--->  120-150.   Also check it whenever you feel badly.  Please consider weight watchers  For wt lose as due to your s-e of even thyroid meds, you are not a candidate for phenteramine.    Diabetes Mellitus and Exercise Exercising regularly is important for your overall health, especially when you have diabetes (diabetes mellitus). Exercising is not only about losing weight. It has many health benefits, such as increasing muscle strength and bone density and reducing body fat and stress. This leads to improved fitness, flexibility, and endurance, all of which result in better overall health. Exercise has additional benefits for people with diabetes, including:  Reducing appetite.  Helping to lower and control blood glucose.  Lowering blood pressure.  Helping to control amounts of fatty substances (lipids) in the blood, such as cholesterol and triglycerides.  Helping the body to respond better to insulin (improving insulin sensitivity).  Reducing how much insulin the body needs.  Decreasing the risk for heart disease by:  Lowering cholesterol and triglyceride levels.  Increasing the levels of good cholesterol.  Lowering blood glucose levels. What is my activity plan? Your health care provider or certified diabetes educator can help you make a plan for the type and frequency of exercise (activity plan) that works for you. Make sure that you:  Do at least 150 minutes of moderate-intensity or vigorous-intensity exercise each week. This could be brisk walking, biking, or water aerobics.  Do stretching and strength exercises, such as yoga or weightlifting, at least 2 times a week.  Spread out your activity over at  least 3 days of the week.  Get some form of physical activity every day.  Do not go more than 2 days in a row without some kind of physical activity.  Avoid being inactive for more than 90 minutes at a time. Take frequent breaks to walk or stretch.  Choose a type of exercise or activity that you enjoy, and set realistic goals.  Start slowly, and gradually increase the intensity of your exercise over time. What do I need to know about managing my diabetes?  Check your blood glucose before and after exercising.  If your blood glucose is higher than 240 mg/dL (13.3 mmol/L) before you exercise, check your urine for ketones. If you have ketones in your urine, do not exercise until your blood glucose returns to normal.  Know the symptoms of low blood glucose (hypoglycemia) and how to treat it. Your risk for hypoglycemia increases during and after exercise. Common symptoms of hypoglycemia can include:  Hunger.  Anxiety.  Sweating and feeling clammy.  Confusion.  Dizziness or feeling light-headed.  Increased heart rate or palpitations.  Blurry vision.  Tingling or numbness around the mouth, lips, or tongue.  Tremors or shakes.  Irritability.  Keep a rapid-acting carbohydrate snack available before, during, and after exercise to help prevent or treat hypoglycemia.  Avoid injecting insulin into areas of the body that are going to be exercised. For example, avoid injecting insulin into:  The arms, when playing tennis.  The legs, when jogging.  Keep records of your exercise habits. Doing this can help you and your health care provider adjust your  diabetes management plan as needed. Write down:  Food that you eat before and after you exercise.  Blood glucose levels before and after you exercise.  The type and amount of exercise you have done.  When your insulin is expected to peak, if you use insulin. Avoid exercising at times when your insulin is peaking.  When you start  a new exercise or activity, work with your health care provider to make sure the activity is safe for you, and to adjust your insulin, medicines, or food intake as needed.  Drink plenty of water while you exercise to prevent dehydration or heat stroke. Drink enough fluid to keep your urine clear or pale yellow. This information is not intended to replace advice given to you by your health care provider. Make sure you discuss any questions you have with your health care provider. Document Released: 08/21/2003 Document Revised: 12/19/2015 Document Reviewed: 11/10/2015 Elsevier Interactive Patient Education  2017 [employer] [place].    Blood Glucose Monitoring, Adult Monitoring your blood sugar (glucose) helps you manage your diabetes. It also helps you and your health care provider determine how well your diabetes management plan is working. Blood glucose monitoring involves checking your blood glucose as often as directed, and keeping a record (log) of your results over time. Why should I monitor my blood glucose? Checking your blood glucose regularly can:  Help you understand how food, exercise, illnesses, and medicines affect your blood glucose.  Let you know what your blood glucose is at any time. You can quickly tell if you are having low blood glucose (hypoglycemia) or high blood glucose (hyperglycemia).  Help you and your health care provider adjust your medicines as needed. When should I check my blood glucose? Follow instructions from your health care provider about how often to check your blood glucose. This may depend on:  The type of diabetes you have.  How well-controlled your diabetes is.  Medicines you are taking. If you have type 1 diabetes:   Check your blood glucose at least 2 times a day.  Also check your blood glucose:  Before every insulin injection.  Before and after exercise.  Between meals.  2 hours after a meal.  Occasionally between 2:00 a.m. and 3:00 a.m.,  as directed.  Before potentially dangerous tasks, like driving or using heavy machinery.  At bedtime.  You may need to check your blood glucose more often, up to 6-10 times a day:  If you use an insulin pump.  If you need multiple daily injections (MDI).  If your diabetes is not well-controlled.  If you are ill.  If you have a history of severe hypoglycemia.  If you have a history of not knowing when your blood glucose is getting low (hypoglycemia unawareness). If you have type 2 diabetes:   If you take insulin or other diabetes medicines, check your blood glucose at least 2 times a day.  If you are on intensive insulin therapy, check your blood glucose at least 4 times a day. Occasionally, you may also need to check between 2:00 a.m. and 3:00 a.m., as directed.  Also check your blood glucose:  Before and after exercise.  Before potentially dangerous tasks, like driving or using heavy machinery.  You may need to check your blood glucose more often if:  Your medicine is being adjusted.  Your diabetes is not well-controlled.  You are ill. What is a blood glucose log?  A blood glucose log is a record of your blood glucose  readings. It helps you and your health care provider:  Look for patterns in your blood glucose over time.  Adjust your diabetes management plan as needed.  Every time you check your blood glucose, write down your result and notes about things that may be affecting your blood glucose, such as your diet and exercise for the day.  Most glucose meters store a record of glucose readings in the meter. Some meters allow you to download your records to a computer. How do I check my blood glucose? Follow these steps to get accurate readings of your blood glucose: Supplies needed    Blood glucose meter.  Test strips for your meter. Each meter has its own strips. You must use the strips that come with your meter.  A needle to prick your finger (lancet).  Do not use lancets more than once.  A device that holds the lancet (lancing device).  A journal or log book to write down your results. Procedure   Wash your hands with soap and water.  Prick the side of your finger (not the tip) with the lancet. Use a different finger each time.  Gently rub the finger until a small drop of blood appears.  Follow instructions that come with your meter for inserting the test strip, applying blood to the strip, and using your blood glucose meter.  Write down your result and any notes. Alternative testing sites   Some meters allow you to use areas of your body other than your finger (alternative sites) to test your blood.  If you think you may have hypoglycemia, or if you have hypoglycemia unawareness, do not use alternative sites. Use your finger instead.  Alternative sites may not be as accurate as the fingers, because blood flow is slower in these areas. This means that the result you get may be delayed, and it may be different from the result that you would get from your finger.  The most common alternative sites are:  Forearm.  Thigh.  Palm of the hand. Additional tips   Always keep your supplies with you.  If you have questions or need help, all blood glucose meters have a 24-hour "hotline" number that you can call. You may also contact your health care provider.  After you use a few boxes of test strips, adjust (calibrate) your blood glucose meter by following instructions that came with your meter. This information is not intended to replace advice given to you by your health care provider. Make sure you discuss any questions you have with your health care provider. Document Released: 06/03/2003 Document Revised: 12/19/2015 Document Reviewed: 11/10/2015 Elsevier Interactive Patient Education  2017 [employer].

## 2016-10-29 NOTE — Progress Notes (Signed)
Assessment and plan:  1. Type 2 diabetes mellitus with complication, unspecified whether long term insulin use ([place])   2. Mixed hyperlipidemia   3. Hypertriglyceridemia   4. Overweight (BMI 25.0-29.9)   5. Hypothyroidism, unspecified type   6. Vitamin D deficiency   7. Personal history of noncompliance with medical treatment and regimen   8. Adjustment disorder with mixed anxiety and depressed mood   9. Gastroesophageal reflux disease, esophagitis presence not specified      Inc metformin from 500XR daily to 1500mg  daily--  please check BS's and keep log.   Cont the 26mcg dose of synthroid--- we will not check levels b/c even if labs indicate a higher dose, pt would not inc it due to s-e.  Thus labs would not change our mgt at this tims  Cont all other meds as written.    Pt was in the office today for 40+ minutes, with over 50% time spent in face to face counseling of patients various medical conditions, treatment plans of those medical conditions including medicine management and lifestyle modification, strategies to improve health and well being; and in coordination of care.    New Prescriptions   No medications on file    Modified Medications   Modified Medication Previous Medication   METFORMIN (GLUCOPHAGE XR) 500 MG 24 HR TABLET metFORMIN (GLUCOPHAGE XR) 500 MG 24 hr tablet      Take 1 tablet (500 mg total) by mouth daily with breakfast. 1000mg  q am and 500mg  q hs    Take 1 tablet (500 mg total) by mouth daily with breakfast.    Discontinued Medications   LEVOTHYROXINE (SYNTHROID, LEVOTHROID) 75 MCG TABLET    Take 1 tablet (75 mcg total) by mouth daily.    Return in about 3 months (around 01/29/2017) for diabetes follow up every 3 mo.  Anticipatory guidance and routine counseling done re: condition, txmnt options and need for follow up. All questions of patient's were answered.   Gross side effects,  risk and benefits, and alternatives of medications discussed with patient.  Patient is aware that all medications have potential side effects and we are unable to predict every sideeffect or drug-drug interaction that may occur.  Expresses verbal understanding and consents to current therapy plan and treatment regiment.  Please see AVS handed out to patient at the end of our visit for additional patient instructions/ counseling done pertaining to today's office visit.  Note: This document was prepared using Dragon voice recognition software and may include unintentional dictation errors.   ----------------------------------------------------------------------------------------------------------------------  Subjective:   CC:   Amanda Douglas is a 71 y.o. female who presents to [facility] today for review and discussion of recent bloodwork that was done.  1.  2. DM:    Patient checking her sugars- ONCE MONTHLY- fbs ran 150 last checked- doesn't have log.   Not having any hyper or hypoglycemic symptoms. Tolerating metformin well.   Taking med daily with breakfst.   Eye exam-- every Sept.   No abn    3.    Blood pressure-     at goal for age and medical condition of diabetes at less than 130/80. NO SX at all today  4.     cholesterol:  Tolerating medications well.   Taking crestor 10mg  nitely.     Very little fish or omega-3 intake b/c makes her feel sick on stomach/ fishy after taste.   No exercise.  5.    Weight-    stable  6.     Mood-    Stable- feeling good.  7.     Hypothyroidism-        Last time thhyroid meds were cut from 176mcg to 75 mcg due to a tsh 0.02.   Repeat labs showed  1.98 but pt still had warm, heart racing, nauseated.    Pt went down on dose herself from 75 to 59mcg- been on that regularly for atleast 2 mo.   [name] sx are better, no Nausea with exercise, no heart racing, no aces sweating.     ( patient TSH is abnormally low and is even  lower than when prior checked 9 months ago by her previous PCP. Complaints of feeling overly hot at times, possible racing of heart. Sweating more than usual.   These symptoms have abated some since decreasing from 100 g dose of Synthroid to 75 g dose of Synthroid approximately 1 month ago. )   8.      GERD:    Taking [employer] brand of nexium.-- works better than the generic ones.  ( Last office visit started omeprazole. Tolerating well. Prior to that she was using tums and throwing up even secondary to her heartburn symptoms..  Pt with acid reflux and small hiatal hernia.   Endoscopy and colonoscopy ( 2 small polyps- told to repeat 68yrs)  - less than one year ago--> at high pt gastro.)   Wt Readings from Last 3 Encounters:  10/29/16 175 lb (79.4 kg)  05/26/16 172 lb 8 oz (78.2 kg)  04/19/16 171 lb 4.8 oz (77.7 kg)   BP Readings from Last 3 Encounters:  10/29/16 108/69  05/26/16 126/80  04/19/16 129/77   Pulse Readings from Last 3 Encounters:  10/29/16 73  05/26/16 80  04/19/16 91   BMI Readings from Last 3 Encounters:  10/29/16 28.68 kg/m  05/26/16 28.27 kg/m  04/19/16 28.07 kg/m     Patient Care Team    Relationship Specialty Notifications Start End  [name], DO PCP - General Family Medicine  04/01/16   Margaretmary Bayley, MD Referring Physician Internal Medicine  04/01/16   Gastroenterology, [facility] Physician Gastroenterology  04/19/16    Comment: Patient had endoscopy and colonoscopy earlier in year or late 2016.      Full medical history updated and reviewed in the office today  Patient Active Problem List   Diagnosis Date Noted  . Hypertriglyceridemia 05/26/2016    Priority: High  . Adjustment disorder with mixed anxiety and depressed mood 04/19/2016    Priority: High  . Mixed hyperlipidemia 04/19/2016    Priority: High  . Type 2 diabetes mellitus with complication ([place]) 08/65/7846    Priority: High  . Hypothyroidism 04/19/2016     Priority: Medium  . Overweight (BMI 25.0-29.9) 04/19/2016    Priority: Medium  . Personal history of noncompliance with medical treatment and regimen 04/19/2016    Priority: Medium  . Gastroesophageal reflux disease 04/19/2016    Priority: Medium  . Hyperkalemia 05/26/2016    Priority: Low  . Vitamin D deficiency 04/19/2016    Priority: Low  . Counseling on health promotion and disease prevention 05/26/2016    Past Medical History:  Diagnosis Date  . Depression   . Diabetes mellitus without complication ([place])   . Hyperlipidemia   . Thyroid disease     Past Surgical History:  Procedure Laterality Date  . ANKLE FRACTURE SURGERY    .  CESAREAN SECTION      Social History  Substance Use Topics  . Smoking status: Former Smoker    Packs/day: 1.00    Years: 15.00    Types: Cigarettes    Quit date: 06/14/1977  . Smokeless tobacco: Never Used  . Alcohol use No    Family Hx: Family History  Problem Relation Age of Onset  . Diabetes Mother   . Hypertension Mother   . Thyroid disease Mother   . Depression Mother   . COPD Sister   . Arthritis Sister   . Healthy Brother   . Healthy Daughter   . Diabetes Son   . Cancer Sister        squamous cell  . Hypertension Son      Medications: Current Outpatient Prescriptions  Medication Sig Dispense Refill  . Cholecalciferol (VITAMIN D3) 5000 units CAPS Take 1 capsule by mouth daily.    . citalopram (CELEXA) 40 MG tablet TAKE 1 TABLET BY MOUTH DAILY. 30 tablet 0  . levothyroxine (SYNTHROID, LEVOTHROID) 100 MCG tablet Take 50 mcg by mouth daily before breakfast.    . Lysine 1000 MG TABS Take 1 tablet by mouth daily.    . metFORMIN (GLUCOPHAGE XR) 500 MG 24 hr tablet Take 1 tablet (500 mg total) by mouth daily with breakfast. 1000mg  q am and 500mg  q hs 270 tablet 0  . omeprazole (PRILOSEC) 20 MG capsule TAKE 1 CAPSULE BY MOUTH 2 TIMES A DAY BEFORE A MEAL 60 capsule 0  . rosuvastatin (CRESTOR) 10 MG tablet Take 1 tablet (10 mg  total) by mouth daily. 90 tablet 1   No current facility-administered medications for this visit.     Allergies:  Allergies  Allergen Reactions  . Codeine Nausea And Vomiting     ROS: Review of Systems  Constitutional: Negative for diaphoresis and weight loss.  HENT: Negative for nosebleeds.   Eyes: Negative for blurred vision and double vision.  Respiratory: Negative for shortness of breath and wheezing.   Cardiovascular: Negative for chest pain, palpitations, orthopnea and claudication.  Gastrointestinal: Negative for diarrhea, nausea and vomiting.  Musculoskeletal: Negative for falls and myalgias.  Skin: Negative for rash.  Neurological: Negative for dizziness and focal weakness.  Endo/Heme/Allergies: Negative for polydipsia.  Psychiatric/Behavioral: Negative for memory loss.    Objective:  Blood pressure 108/69, pulse 73, height 5' 5.5" (1.664 m), weight 175 lb (79.4 kg). Body mass index is 28.68 kg/m. Gen:   Well NAD, A and O *3 HEENT:    [place]/AT, EOMI,  MMM, OP- clr Lungs:   Normal work of breathing. CTA B/L, no Wh, rhonchi Heart:   RRR, S1, S2 WNL's, no MRG Abd:   No gross distention Exts:    warm, pink,  Brisk capillary refill, warm and well perfused.  Psych:    No HI/SI, judgement and insight good, Euthymic mood. Full Affect.   Recent Results (from the past 2160 hour(s))  POCT glycosylated hemoglobin (Hb A1C)     Status: Abnormal   Collection Time: 10/29/16  9:37 AM  Result Value Ref Range   Hemoglobin A1C 8.6

## 2016-11-17 ENCOUNTER — Other Ambulatory Visit: Payer: Self-pay | Admitting: Family Medicine

## 2016-11-24 ENCOUNTER — Other Ambulatory Visit: Payer: Self-pay | Admitting: Family Medicine

## 2017-01-18 ENCOUNTER — Other Ambulatory Visit: Payer: Self-pay

## 2017-01-24 ENCOUNTER — Other Ambulatory Visit: Payer: Self-pay

## 2017-01-24 DIAGNOSIS — E039 Hypothyroidism, unspecified: Secondary | ICD-10-CM

## 2017-01-24 MED ORDER — LEVOTHYROXINE SODIUM 50 MCG PO TABS: 50.0000 ug | ORAL_TABLET | Freq: Every day | ORAL | 0 refills | Status: DC

## 2017-01-24 NOTE — Telephone Encounter (Signed)
Refill request for levothyroxine. MPulliam, CMA/RT(R)

## 2017-01-25 ENCOUNTER — Other Ambulatory Visit: Payer: Self-pay

## 2017-01-25 DIAGNOSIS — E039 Hypothyroidism, unspecified: Secondary | ICD-10-CM

## 2017-01-25 MED ORDER — LEVOTHYROXINE SODIUM 50 MCG PO TABS: 50.0000 ug | ORAL_TABLET | Freq: Every day | ORAL | 0 refills | Status: DC

## 2017-01-25 NOTE — Telephone Encounter (Signed)
RX for levothyroxine dated 01/24/17 was marked "no print" therefore pharmacy did not receive the RX.  Resent levothyroxine RX to [facility].  Charyl Bigger, CMA

## 2017-02-04 ENCOUNTER — Ambulatory Visit: Payer: Medicare PPO | Admitting: Family Medicine

## 2017-02-21 ENCOUNTER — Other Ambulatory Visit: Payer: Self-pay | Admitting: Family Medicine

## 2017-02-21 DIAGNOSIS — E118 Type 2 diabetes mellitus with unspecified complications: Secondary | ICD-10-CM

## 2017-02-21 NOTE — Telephone Encounter (Signed)
Pt has appt 03/03/17.  No further refills until pt is seen for f/u.  Charyl Bigger, CMA

## 2017-03-03 ENCOUNTER — Ambulatory Visit (INDEPENDENT_AMBULATORY_CARE_PROVIDER_SITE_OTHER): Payer: Medicare PPO | Admitting: Family Medicine

## 2017-03-03 ENCOUNTER — Encounter: Payer: Self-pay | Admitting: Family Medicine

## 2017-03-03 VITALS — BP 140/84 | HR 96 | Ht 65.5 in | Wt 174.2 lb

## 2017-03-03 DIAGNOSIS — E1169 Type 2 diabetes mellitus with other specified complication: Secondary | ICD-10-CM

## 2017-03-03 DIAGNOSIS — F4323 Adjustment disorder with mixed anxiety and depressed mood: Secondary | ICD-10-CM | POA: Diagnosis not present

## 2017-03-03 DIAGNOSIS — E1159 Type 2 diabetes mellitus with other circulatory complications: Secondary | ICD-10-CM | POA: Diagnosis not present

## 2017-03-03 DIAGNOSIS — E559 Vitamin D deficiency, unspecified: Secondary | ICD-10-CM

## 2017-03-03 DIAGNOSIS — E118 Type 2 diabetes mellitus with unspecified complications: Secondary | ICD-10-CM | POA: Diagnosis not present

## 2017-03-03 DIAGNOSIS — Z9119 Patient's noncompliance with other medical treatment and regimen: Secondary | ICD-10-CM

## 2017-03-03 DIAGNOSIS — E039 Hypothyroidism, unspecified: Secondary | ICD-10-CM | POA: Diagnosis not present

## 2017-03-03 DIAGNOSIS — E785 Hyperlipidemia, unspecified: Secondary | ICD-10-CM

## 2017-03-03 DIAGNOSIS — L988 Other specified disorders of the skin and subcutaneous tissue: Secondary | ICD-10-CM | POA: Insufficient documentation

## 2017-03-03 DIAGNOSIS — C44319 Basal cell carcinoma of skin of other parts of face: Secondary | ICD-10-CM | POA: Diagnosis not present

## 2017-03-03 DIAGNOSIS — I10 Essential (primary) hypertension: Secondary | ICD-10-CM

## 2017-03-03 DIAGNOSIS — E663 Overweight: Secondary | ICD-10-CM

## 2017-03-03 DIAGNOSIS — C4491 Basal cell carcinoma of skin, unspecified: Secondary | ICD-10-CM

## 2017-03-03 DIAGNOSIS — J3089 Other allergic rhinitis: Secondary | ICD-10-CM

## 2017-03-03 DIAGNOSIS — E875 Hyperkalemia: Secondary | ICD-10-CM | POA: Diagnosis not present

## 2017-03-03 DIAGNOSIS — Z91199 Patient's noncompliance with other medical treatment and regimen due to unspecified reason: Secondary | ICD-10-CM

## 2017-03-03 DIAGNOSIS — Z7189 Other specified counseling: Secondary | ICD-10-CM

## 2017-03-03 DIAGNOSIS — Z5320 Procedure and treatment not carried out because of patient's decision for unspecified reasons: Secondary | ICD-10-CM | POA: Insufficient documentation

## 2017-03-03 HISTORY — DX: Basal cell carcinoma of skin, unspecified: C44.91

## 2017-03-03 MED ORDER — LOSARTAN POTASSIUM 25 MG PO TABS: 25.0000 mg | ORAL_TABLET | Freq: Every day | ORAL | 4 refills | Status: DC

## 2017-03-03 MED ORDER — FEXOFENADINE HCL 180 MG PO TABS: 180.0000 mg | ORAL_TABLET | Freq: Every day | ORAL | 3 refills | Status: AC

## 2017-03-03 MED ORDER — FLUTICASONE PROPIONATE 50 MCG/ACT NA SUSP: NASAL | 2 refills | Status: DC

## 2017-03-03 MED ORDER — ROSUVASTATIN CALCIUM 10 MG PO TABS: 10.0000 mg | ORAL_TABLET | Freq: Every day | ORAL | 1 refills | Status: DC

## 2017-03-03 NOTE — Progress Notes (Signed)
Impression and Recommendations:    1. Type 2 diabetes mellitus with complication, unspecified whether long term insulin use ([place])   2. Hyperlipidemia associated with type 2 diabetes mellitus ([place])   3. Hypertension associated with diabetes ([place])   4. Basal cell carcinoma (BCC) of skin of other part of face   5. Genetic Acquired porokeratosis   6. Environmental and seasonal allergies   7. Adjustment disorder with mixed anxiety and depressed mood   8. Vitamin D deficiency   9. Hyperkalemia   10. Personal history of noncompliance with medical treatment and regimen   11. Hypothyroidism, unspecified type   12. Overweight (BMI 25.0-29.9)   13. Counseling on health promotion and disease prevention   14. Colonoscopy refused     Type 2 diabetes mellitus with complication, unspecified whether long term insulin use ([place]) - Plan: CBC with Differential/Platelet, Comprehensive metabolic panel, Hemoglobin A1c, losartan (COZAAR) 25 MG tablet  Hyperlipidemia associated with type 2 diabetes mellitus ([place]) - Plan: CBC with Differential/Platelet, Comprehensive metabolic panel, Lipid panel, DISCONTINUED: rosuvastatin (CRESTOR) 10 MG tablet  Hypertension associated with diabetes ([place]) - Plan: CBC with Differential/Platelet, Comprehensive metabolic panel, losartan (COZAAR) 25 MG tablet  Basal cell carcinoma (BCC) of skin of other part of face - Plan: Ambulatory referral to Dermatology  Genetic Acquired porokeratosis - Plan: Ambulatory referral to Dermatology  Environmental and seasonal allergies - Plan: fluticasone (FLONASE) 50 MCG/ACT nasal spray, fexofenadine (ALLEGRA) 180 MG tablet  Adjustment disorder with mixed anxiety and depressed mood - Plan: CBC with Differential/Platelet, Comprehensive metabolic panel  Vitamin D deficiency - Plan: VITAMIN D 25 Hydroxy (Vit-D Deficiency, Fractures)  Hyperkalemia - Plan: Comprehensive metabolic panel  Personal history of noncompliance with medical  treatment and regimen - Plan: CBC with Differential/Platelet  Hypothyroidism, unspecified type - Plan: CBC with Differential/Platelet, TSH, T4, free  Overweight (BMI 25.0-29.9) - Plan: CBC with Differential/Platelet  Counseling on health promotion and disease prevention  Colonoscopy refused  Basal cell carcinoma (BCC)- chin in 2012 Importance of yearly skin screenings discussed with patient.  - Referral to dermatologist sent today.  Patient prefers [facility]  Hyperkalemia We'll check CMP today  Vitamin D deficiency Continue current daily supplementation  Check vitamin D today  Counseling on health promotion and disease prevention Advised patient to come in for yearly health maintenance exam\Medicare wellness.  Explained to her this is when we make sure she is up-to-date on immunizations, screening tests etc.  Patient refuses repeat colonoscopy   The patient was counseled, risk factors were discussed, anticipatory guidance given.   New Prescriptions   FEXOFENADINE (ALLEGRA) 180 MG TABLET    Take 1 tablet (180 mg total) by mouth daily.   FLUTICASONE (FLONASE) 50 MCG/ACT NASAL SPRAY    Do 1 spray each nostril twice daily after your sinus rinses   LOSARTAN (COZAAR) 25 MG TABLET    Take 1 tablet (25 mg total) by mouth daily.    Discontinued Medications   CITALOPRAM (CELEXA) 40 MG TABLET    TAKE 1 TABLET BY MOUTH DAILY.   LEVOTHYROXINE (SYNTHROID, LEVOTHROID) 50 MCG TABLET    Take 1 tablet (50 mcg total) by mouth daily before breakfast.   METFORMIN (GLUCOPHAGE-XR) 500 MG 24 HR TABLET    TAKE 2 TABLETS BY MOUTH DAILY WITH BREAKFAST AND 1 TABLET AT BEDTIME.   ROSUVASTATIN (CRESTOR) 10 MG TABLET    Take 1 tablet (10 mg total) by mouth daily. Take 1 tablet nightly before bedtime    Modified  Medications   Modified Medication Previous Medication   CITALOPRAM (CELEXA) 40 MG TABLET citalopram (CELEXA) 40 MG tablet      TAKE 1 TABLET BY MOUTH DAILY. NEEDS OFFICE VISIT FOR FURTHER  REFILLS.    Take 1 tablet (40 mg total) by mouth daily. Needs office visit for further refills   GLIPIZIDE (GLUCOTROL) 10 MG TABLET glipiZIDE (GLUCOTROL) 10 MG tablet      Take 1 tablet (10 mg total) by mouth 2 (two) times daily before a meal. ABSOLUTELY NO FURTHER REFILLS UNTIL PATIENT IS SEEN    Take 1 tablet (10 mg total) by mouth 2 (two) times daily before a meal.   LEVOTHYROXINE (SYNTHROID, LEVOTHROID) 50 MCG TABLET levothyroxine (SYNTHROID, LEVOTHROID) 50 MCG tablet      TAKE 1 TABLET BY MOUTH DAILY BEFORE BREAKFAST.    TAKE 1 TABLET (50 MCG TOTAL) BY MOUTH DAILY BEFORE BREAKFAST.   ROSUVASTATIN (CRESTOR) 10 MG TABLET rosuvastatin (CRESTOR) 10 MG tablet      TAKE 1 TABLET BY MOUTH DAILY.    TAKE 1 TABLET BY MOUTH DAILY.     Meds ordered this encounter  Medications  . DISCONTD: rosuvastatin (CRESTOR) 10 MG tablet    Sig: Take 1 tablet (10 mg total) by mouth daily. Take 1 tablet nightly before bedtime    Dispense:  90 tablet    Refill:  1  . fluticasone (FLONASE) 50 MCG/ACT nasal spray    Sig: Do 1 spray each nostril twice daily after your sinus rinses    Dispense:  16 g    Refill:  2  . fexofenadine (ALLEGRA) 180 MG tablet    Sig: Take 1 tablet (180 mg total) by mouth daily.    Dispense:  90 tablet    Refill:  3  . losartan (COZAAR) 25 MG tablet    Sig: Take 1 tablet (25 mg total) by mouth daily.    Dispense:  90 tablet    Refill:  4     Orders Placed This Encounter  Procedures  . CBC with Differential/Platelet  . Comprehensive metabolic panel  . Hemoglobin A1c  . Lipid panel  . TSH  . T4, free  . VITAMIN D 25 Hydroxy (Vit-D Deficiency, Fractures)  . Ambulatory referral to Dermatology     Gross side effects, risk and benefits, and alternatives of medications and treatment plan in general discussed with patient.  Patient is aware that all medications have potential side effects and we are unable to predict every side effect or drug-drug interaction that may occur.    Patient will call with any questions prior to using medication if they have concerns.  Expresses verbal understanding and consents to current therapy and treatment regimen.  No barriers to understanding were identified.  Red flag symptoms and signs discussed in detail.  Patient expressed understanding regarding what to do in case of emergency\urgent symptoms  Please see AVS handed out to patient at the end of our visit for further patient instructions/ counseling done pertaining to today's office visit.   Return in about 3 months (around 06/02/2017) for diabetes follow up every 9mo.     Note: This document was prepared using Dragon voice recognition software and may include unintentional dictation errors.  [name] 8:56 AM --------------------------------------------------------------------------------------------------------------------------------------------------------------------------------------------------------------------------------------------    Subjective:    CC:  Chief Complaint  Patient presents with  . Follow-up    HPI: Amanda Douglas is a 71 y.o. female who presents to [facility] [facility] today  for issues as discussed below.  --> Patient is fasting today and will get labs  1. DM:    2. --> Last office visit we increased her metformin from 500 XR daily to 1500 mg XR daily because her A1c went up to 8.6.  She was told to check her blood sugars and bring a log today.  She is taking 1 tabs after breakfast and 1 after dinner.  FBS 140's- 152   Patient checking her sugars- ONCE MONTHLY- fbs ran 150 last checked- doesn't have log.   Not having any hyper or hypoglycemic symptoms. Tolerating metformin well.   Taking med daily with breakfst.     No abn                3.    Blood pressure-     at goal for age and medical condition of diabetes at less than 130/80. NO SX at all today   4.     cholesterol:  Tolerating medications well.    Taking crestor 10mg  nitely.     Very little fish or omega-3 intake b/c makes her feel sick on stomach/ fishy after taste.   No exercise.   5.    Weight-    stable   6.     Mood-    Stable- feeling good.  Been off of them for about 1 week or so now.  She's had no increase in her anxious feelings or depression or sadness etc.  She weened herself off the celexa slowly over weeks and weeks time.  Felt she didn't need it anymore and doesn't like to be dependent on any medicines.   7.     Hypothyroidism-    ---> Last office visit we decided to continue her 50 g dose of Synthroid.  Patient remains asymptomatic.  She says her energy levels are good   Last time thhyroid meds were cut from 122mcg to 75 mcg due to a tsh 0.02.   Repeat labs showed  1.98 but pt still had warm, heart racing, nauseated.    Pt went down on dose herself from 75 to 17mcg- been on that regularly for atleast 2 mo.   [name] sx are better, no Nausea with exercise, no heart racing, no aces sweating.     ( patient TSH is abnormally low and is even lower than when prior checked 9 months ago by her previous PCP. Complaints of feeling overly hot at times, possible racing of heart. Sweating more than usual.   These symptoms have abated some since decreasing from 100 g dose of Synthroid to 75 g dose of Synthroid approximately 1 month ago. )   8.      GERD:    Taking [employer] brand of nexium.-- works better than the generic ones. Sx well controlled  ( Last office visit started omeprazole. Tolerating well. Prior to that she was using tums and throwing up even secondary to her heartburn symptoms.. Pt with acid reflux and small hiatal hernia. Endoscopy and colonoscopy ( 2 small polyps- told to repeat 45yrs) - less than one year ago-->at [facility].)  Problem  Basal cell carcinoma (BCC)- chin in 2012   - Patient notified me of her history of basal cell carcinoma today for the first time.  - Used to see Celesta Aver, MD-  dermatologist in [facility] but she is no longer practicing.        Hyperkalemia  Vitamin D Deficiency  Counseling On Health Promotion  and Disease Prevention     Wt Readings from Last 3 Encounters:  03/15/17 174 lb 12.8 oz (79.3 kg)  03/03/17 174 lb 3.2 oz (79 kg)  10/29/16 175 lb (79.4 kg)   BP Readings from Last 3 Encounters:  03/15/17 140/84  03/03/17 140/84  10/29/16 108/69   Pulse Readings from Last 3 Encounters:  03/15/17 76  03/03/17 96  10/29/16 73   BMI Readings from Last 3 Encounters:  03/15/17 28.65 kg/m  03/03/17 28.55 kg/m  10/29/16 28.68 kg/m     Patient Care Team    Relationship Specialty Notifications Start End  [name], DO PCP - General Family Medicine  04/01/16   Margaretmary Bayley, MD Referring Physician Internal Medicine  04/01/16   Gastroenterology, [facility] Physician Gastroenterology  04/19/16    Comment: Patient had endoscopy and colonoscopy earlier in year or late 2016.       Patient Active Problem List   Diagnosis Date Noted  . Hypertension associated with diabetes ([place]) 03/03/2017    Priority: High  . Hypertriglyceridemia 05/26/2016    Priority: High  . Adjustment disorder with mixed anxiety and depressed mood 04/19/2016    Priority: High  . Hyperlipidemia associated with type 2 diabetes mellitus ([place]) 04/19/2016    Priority: High  . Type 2 diabetes mellitus with complication ([place]) 95/28/4132    Priority: High  . Hypothyroidism 04/19/2016    Priority: Medium  . Overweight (BMI 25.0-29.9) 04/19/2016    Priority: Medium  . Personal history of noncompliance with medical treatment and regimen 04/19/2016    Priority: Medium  . Gastroesophageal reflux disease 04/19/2016    Priority: Medium  . Environmental and seasonal allergies 03/03/2017    Priority: Low  . 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 Basal cell carcinoma (BCC)- chin in 2012 03/03/2017    Priority: Low  . Colonoscopy  refused 03/03/2017    Priority: Low  . Hyperkalemia 05/26/2016    Priority: Low  . Vitamin D deficiency 04/19/2016    Priority: Low  . Exertional chest pain 03/15/2017  . Nausea w exertion- relieved with rest 03/15/2017  . Counseling on health promotion and disease prevention 05/26/2016    Past Medical history, Surgical history, Family history, Social history, Allergies and Medications have been entered into the medical record, reviewed and changed as needed.    Current Meds  Medication Sig  . Cholecalciferol (VITAMIN D3) 5000 units CAPS Take 1 capsule by mouth daily.  Marland Kitchen Lysine 1000 MG TABS Take 1 tablet by mouth daily.  Marland Kitchen omeprazole (PRILOSEC) 20 MG capsule TAKE 1 CAPSULE BY MOUTH 2 TIMES A DAY BEFORE A MEAL  . [DISCONTINUED] levothyroxine (SYNTHROID, LEVOTHROID) 50 MCG tablet Take 1 tablet (50 mcg total) by mouth daily before breakfast.  . [DISCONTINUED] metFORMIN (GLUCOPHAGE-XR) 500 MG 24 hr tablet TAKE 2 TABLETS BY MOUTH DAILY WITH BREAKFAST AND 1 TABLET AT BEDTIME.  . [DISCONTINUED] rosuvastatin (CRESTOR) 10 MG tablet TAKE 1 TABLET BY MOUTH DAILY.  . [DISCONTINUED] rosuvastatin (CRESTOR) 10 MG tablet Take 1 tablet (10 mg total) by mouth daily. Take 1 tablet nightly before bedtime    Allergies:  Allergies  Allergen Reactions  . Codeine Nausea And Vomiting     Review of Systems: General:   Denies fever, chills, unexplained weight loss.  Optho/Auditory:   Denies visual changes, blurred vision/LOV Respiratory:   Denies wheeze, DOE more than baseline levels.  Cardiovascular:   Denies chest pain, palpitations, new onset peripheral edema  Gastrointestinal:   Denies nausea, vomiting, diarrhea, abd pain.  Genitourinary: Denies dysuria, freq/ urgency, flank pain or discharge from genitals.  Endocrine:     Denies hot or cold intolerance, polyuria, polydipsia. Musculoskeletal:   Denies unexplained myalgias, joint swelling, unexplained arthralgias, gait problems.  Skin:  Denies new  onset rash, suspicious lesions Neurological:     Denies dizziness, unexplained weakness, numbness  Psychiatric/Behavioral:   Denies mood changes, suicidal or homicidal ideations, hallucinations    Objective:   Blood pressure 140/84, pulse 96, height 5' 5.5" (1.664 m), weight 174 lb 3.2 oz (79 kg). Body mass index is 28.55 kg/m. General:  Well Developed, well nourished, appropriate for stated age.  Neuro:  Alert and oriented,  extra-ocular muscles intact  HEENT:  Normocephalic, atraumatic, neck supple, no carotid bruits appreciated  Skin:  no gross rash, warm, pink. Cardiac:  RRR, S1 S2 Respiratory:  ECTA B/L and A/P, Not using accessory muscles, speaking in full sentences- unlabored. Vascular:  Ext warm, no cyanosis apprec.; cap RF less 2 sec. Psych:  No HI/SI, judgement and insight good, Euthymic mood. Full Affect.

## 2017-03-03 NOTE — Assessment & Plan Note (Signed)
Importance of yearly skin screenings discussed with patient.  - Referral to dermatologist sent today.  Patient prefers [facility]

## 2017-03-03 NOTE — Assessment & Plan Note (Signed)
Continue current daily supplementation  Check vitamin D today

## 2017-03-03 NOTE — Assessment & Plan Note (Signed)
We'll check CMP today

## 2017-03-03 NOTE — Patient Instructions (Addendum)
Eye exam-- every Sept. please call for an appointment and make sure you have them send the results to Dr. Hershal Coria office  Please make sure you take take your Crestor or cholesterol medicine at night before bed.  We started you on losartan which is very low dose to help protect your kidneys since you have the diabetes.  This is in the class of blood pressure medicines so if you feel dizzy or lightheaded or anything like that just please check your blood pressure to make sure is not running too low.  I started you on a very low dose sliding do not think it's going to be a problem but just so you know.   --> Next time you're in the office in 3 months we can check your microalbumin or your protein in the urine.    Hopefully starting the losartan this will keep the protein out of your urine and protect your kidneys against the diabetes   Drink 1/2 weight in ounces of water per day.   87 oz water per day!!!!        Heat Exhaustion Information WHAT IS HEAT EXHAUSTION? Heat exhaustion happens when your body gets overheated from hot weather or from exercise. Heat exhaustion can lead to heat stroke, a life-threatening condition that requires emergency care. Heat exhaustion is more likely to develop when:  You are exercising or being active.  You are in hot or humid weather.  You are in bright sunshine.  You are not drinking enough water.  WHO IS AT RISK FOR THIS CONDITION? This condition is more likely to develop in:  People who exercise in hot or humid weather.  People who exercise beyond their fitness level.  People who wear clothing that does not allow sweat to evaporate.  People who are dehydrated.  People who drink a lot of alcoholic beverages or beverages that have caffeine. This can lead to dehydration.  People who are age 66 or older.  Children.  People who have a medical condition such as heart disease, poor circulation, sickle cell disease, or high blood  pressure.  People who have a fever.  People who are very overweight (obese).  WHAT ARE THE SYMPTOMS OF THIS CONDITION? Symptoms of heat exhaustion include:  Heavy sweating along with feeling weak, dizzy, light-headed, and nauseous.  Rapid heartbeat.  Headache.  Urine that is darker than normal.  Muscle cramps, such as in the leg or side (flank).  Moist, cool, and clammy skin.  Fatigue.  Thirst.  Confusion.  Fainting.  WHAT SHOULD I DO IF I THINK I HAVE THIS CONDITION? If you think that you have heat exhaustion, call your health care provider. Follow his or her instructions. You should also:  Call a friend or a family member and ask him or her to stay with you.  Move to a cooler location, such as: ? Into the shade. ? In front of a fan. ? An air-conditioned space.  Lie down and rest.  Slowly drink nonalcoholic, caffeine-free fluids.  Take off tight clothing or extra clothing.  Take a cool bath or shower, if possible. If you do not have access to a bath or shower, dab or mist cool water on your skin.  WHY IS IT IMPORTANT TO TREAT THIS CONDITION? It is important to take care of yourself and treat heat exhaustion as soon as possible. Untreated heat exhaustion can turn into heat stroke, which is a life-threatening condition that requires urgent medical treatment. HOW CAN I PREVENT  THIS CONDITION? To prevent this condition:  Drink enough fluid to keep your urine clear or pale yellow. This helps your body to sweat properly.  Avoid outdoor activities on very hot or humid days.  Do not exercise or do other physical activity when you are not feeling well.  Take breaks often during physical activity.  Wear light-colored, loose-fitting, and lightweight clothing when it is hot outside.  Wear a hat and use sunscreen when exercising outdoors.  Avoid being outside during the hottest times of the day.  Check with your health care provider before you start any new  activity, especially if you take medicine or have a medical condition.  Start any new activity slowly and work up to your fitness level.  HOW CAN I HELP TO PROTECT ELDERLY RELATIVES AND NEIGHBORS FROM THIS CONDITION? People who are age 72 or older are at greater risk for heat exhaustion. Their bodies have a harder time adjusting to heat. They are also more likely to have a medical condition or be on medicines that increase their risk for heat exhaustion. They may get heat exhaustion indoors if the heat is high for several days. You can help to protect them during hot weather by:  Checking on them two or more times each day.  Making sure that they are drinking plenty of cool, nonalcoholic, and caffeine-free fluids.  Making sure that they use their air conditioner.  Taking them to a location where air conditioning is available.  Talking with their health care provider about their medical needs, medicines, and fluid requirements.  SEEK MEDICAL CARE IF:  Your symptoms last longer than 30 minutes.  SEEK IMMEDIATE MEDICAL CARE IF:  You have any symptoms of heat stroke. These include: ? Fever. ? Vomiting. ? Red skin. ? Inability to sweat, resulting in hot, dry skin. ? Excessive thirst. ? Rapid breathing. ? Headache. ? Confusion or disorientation. ? Fainting. ? Seizures. These symptoms may represent a serious problem that is an emergency. Do not wait to see if the symptoms will go away. Get medical help right away. Call your local emergency services (911 in the [place] Do not drive yourself to the hospital. This information is not intended to replace advice given to you by your health care provider. Make sure you discuss any questions you have with your health care provider. Document Released: 03/09/2008 Document Revised: 12/19/2015 Document Reviewed: 09/21/2015 Elsevier Interactive Patient Education  2018 [employer].   How to Increase Your Level of Physical Activity  Getting  regular physical activity is important for your overall health and well-being. Most people do not get enough exercise. There are easy ways to increase your level of physical activity, even if you have not been very active in the past or you are just starting out. Why is physical activity important? Physical activity has many short-term and long-term health benefits. Regular exercise can:  Help you lose weight or maintain a healthy weight.  Strengthen your muscles and bones.  Boost your mood and improve self-esteem.  Reduce your risk of certain long-term (chronic) diseases, like heart disease, cancer, and diabetes.  Help you stay capable of walking and moving around (mobile) as you age.  Prevent accidents, such as falls, as you age.  Increase life expectancy.  What are the benefits of being physically active on a regular basis? In addition to improving your physical health, being physically active on most days of the week can help you in ways that you may not expect. Benefits of  regular physical activity may include:  Feeling good about your body.  Being able to move around more easily and for longer periods of time without getting tired (increased stamina).  Finding new sources of fun and enjoyment.  Meeting new people who share a common interest.  Being able to fight off illness better (enhanced immunity).  Being able to sleep better.  What can happen if I am not physically active on a regular basis? Not getting enough physical activity can lead to an unhealthy lifestyle and future health problems. This can increase your chances of:  Becoming overweight or obese.  Becoming sick.  Developing chronic illnesses, like heart disease or diabetes.  Having mental health problems, like depression or anxiety.  Having sleep problems.  Having trouble walking or getting yourself around (reduced mobility).  Injuring yourself in a fall as you get older.  What steps can I take to  be more physically active?  Check with your health care provider about how to get started. Ask your health care provider what activities are safe for you.  Start out slowly. Walking or doing some simple chair exercises is a good place to start, especially if you have not been active before or for a long time.  Try to find activities that you enjoy. You are more likely to commit to an exercise routine if it does not feel like a chore.  If you have bone or joint problems, choose low-impact exercises, like walking or swimming.  Include physical activity in your everyday routine.  Invite friends or family members to exercise with you. This also will help you commit to your workout plan.  Set goals that you can work toward.  Aim for at least 150 minutes of moderate-intensity exercise each week. Examples of moderate-intensity exercise include walking or riding a bike. Where to find more information:  Centers for Disease Control and Prevention: [employer]is  [employer]'s [employer] on [employer], Sports & Nutrition www.http://villegas.org/  ChooseMyPlate: [employer] Contact a health care provider if:  You have headaches, muscle aches, or joint pain.  You feel dizzy or light-headed while exercising.  You faint.  You have chest pain while exercising. Summary  Exercise benefits your mind and body at any age, even if you are just starting out.  If you have a chronic illness or have not been active for a while, check with your health care provider before increasing your physical activity.  Choose activities that are safe and enjoyable for you.Ask your health care provider what activities are safe for you.  Start slowly. Tell your health care provider if you have problems as you start to increase your activity level. This information is not intended to replace advice given to you by your health care provider. Make sure you discuss  any questions you have with your health care provider. Document Released: 05/20/2016 Document Revised: 05/20/2016 Document Reviewed: 05/20/2016 Elsevier Interactive Patient Education  [employer].

## 2017-03-03 NOTE — Assessment & Plan Note (Signed)
Advised patient to come in for yearly health maintenance exam\Medicare wellness.  Explained to her this is when we make sure she is up-to-date on immunizations, screening tests etc.  Patient refuses repeat colonoscopy

## 2017-03-04 LAB — CBC WITH DIFFERENTIAL/PLATELET
BASOS: 1 %
Basophils Absolute: 0 10*3/uL (ref 0.0–0.2)
EOS (ABSOLUTE): 0 10*3/uL (ref 0.0–0.4)
Eos: 1 %
Hematocrit: 41.6 % (ref 34.0–46.6)
Hemoglobin: 13.6 g/dL (ref 11.1–15.9)
Immature Grans (Abs): 0 10*3/uL (ref 0.0–0.1)
Immature Granulocytes: 0 %
LYMPHS ABS: 2.4 10*3/uL (ref 0.7–3.1)
Lymphs: 29 %
MCH: 29.4 pg (ref 26.6–33.0)
MCHC: 32.7 g/dL (ref 31.5–35.7)
MCV: 90 fL (ref 79–97)
MONOS ABS: 0.5 10*3/uL (ref 0.1–0.9)
Monocytes: 7 %
NEUTROS ABS: 5.3 10*3/uL (ref 1.4–7.0)
Neutrophils: 62 %
PLATELETS: 317 10*3/uL (ref 150–379)
RBC: 4.62 x10E6/uL (ref 3.77–5.28)
RDW: 13.8 % (ref 12.3–15.4)
WBC: 8.3 10*3/uL (ref 3.4–10.8)

## 2017-03-04 LAB — COMPREHENSIVE METABOLIC PANEL
A/G RATIO: 2.5 — AB (ref 1.2–2.2)
ALK PHOS: 91 IU/L (ref 39–117)
ALT: 15 IU/L (ref 0–32)
AST: 17 IU/L (ref 0–40)
Albumin: 4.7 g/dL (ref 3.5–4.8)
BILIRUBIN TOTAL: 0.6 mg/dL (ref 0.0–1.2)
BUN / CREAT RATIO: 26 (ref 12–28)
BUN: 20 mg/dL (ref 8–27)
CHLORIDE: 103 mmol/L (ref 96–106)
CO2: 20 mmol/L (ref 20–29)
Calcium: 10 mg/dL (ref 8.7–10.3)
Creatinine, Ser: 0.78 mg/dL (ref 0.57–1.00)
GFR calc non Af Amer: 76 mL/min/{1.73_m2} (ref >59)
GFR, EST AFRICAN AMERICAN: 88 mL/min/{1.73_m2} (ref >59)
Globulin, Total: 1.9 g/dL (ref 1.5–4.5)
Glucose: 144 mg/dL — ABNORMAL HIGH (ref 65–99)
POTASSIUM: 5.3 mmol/L — AB (ref 3.5–5.2)
Sodium: 140 mmol/L (ref 134–144)
TOTAL PROTEIN: 6.6 g/dL (ref 6.0–8.5)

## 2017-03-04 LAB — VITAMIN D 25 HYDROXY (VIT D DEFICIENCY, FRACTURES): VIT D 25 HYDROXY: 49.4 ng/mL (ref 30.0–100.0)

## 2017-03-04 LAB — TSH: TSH: 10.68 u[IU]/mL — ABNORMAL HIGH (ref 0.450–4.500)

## 2017-03-04 LAB — HEMOGLOBIN A1C
Est. average glucose Bld gHb Est-mCnc: 186 mg/dL
HEMOGLOBIN A1C: 8.1 % — AB (ref 4.8–5.6)

## 2017-03-04 LAB — LIPID PANEL
CHOLESTEROL TOTAL: 144 mg/dL (ref 100–199)
Chol/HDL Ratio: 2.5 ratio (ref 0.0–4.4)
HDL: 58 mg/dL (ref >39)
LDL Calculated: 57 mg/dL (ref 0–99)
Triglycerides: 144 mg/dL (ref 0–149)
VLDL Cholesterol Cal: 29 mg/dL (ref 5–40)

## 2017-03-04 LAB — T4, FREE: FREE T4: 1.26 ng/dL (ref 0.82–1.77)

## 2017-03-11 ENCOUNTER — Other Ambulatory Visit: Payer: Self-pay | Admitting: Family Medicine

## 2017-03-11 DIAGNOSIS — E118 Type 2 diabetes mellitus with unspecified complications: Secondary | ICD-10-CM

## 2017-03-11 MED ORDER — METFORMIN HCL 1000 MG PO TABS: 1000.0000 mg | ORAL_TABLET | Freq: Two times a day (BID) | ORAL | 0 refills | Status: DC

## 2017-03-11 MED ORDER — GLIPIZIDE 10 MG PO TABS: 10.0000 mg | ORAL_TABLET | Freq: Two times a day (BID) | ORAL | 2 refills | Status: DC

## 2017-03-11 NOTE — Progress Notes (Unsigned)
Recent Results (from the past 2160 hour(s))  CBC with Differential/Platelet     Status: None   Collection Time: 03/03/17 11:22 AM  Result Value Ref Range   WBC 8.3 3.4 - 10.8 x10E3/uL   RBC 4.62 3.77 - 5.28 x10E6/uL   Hemoglobin 13.6 11.1 - 15.9 g/dL   Hematocrit 41.6 34.0 - 46.6 %   MCV 90 79 - 97 fL   MCH 29.4 26.6 - 33.0 pg   MCHC 32.7 31.5 - 35.7 g/dL   RDW 13.8 12.3 - 15.4 %   Platelets 317 150 - 379 x10E3/uL   Neutrophils 62 Not Estab. %   Lymphs 29 Not Estab. %   Monocytes 7 Not Estab. %   Eos 1 Not Estab. %   Basos 1 Not Estab. %   Neutrophils Absolute 5.3 1.4 - 7.0 x10E3/uL   Lymphocytes Absolute 2.4 0.7 - 3.1 x10E3/uL   Monocytes Absolute 0.5 0.1 - 0.9 x10E3/uL   EOS (ABSOLUTE) 0.0 0.0 - 0.4 x10E3/uL   Basophils Absolute 0.0 0.0 - 0.2 x10E3/uL   Immature Granulocytes 0 Not Estab. %   Immature Grans (Abs) 0.0 0.0 - 0.1 x10E3/uL  Comprehensive metabolic panel     Status: Abnormal   Collection Time: 03/03/17 11:22 AM  Result Value Ref Range   Glucose 144 (H) 65 - 99 mg/dL   BUN 20 8 - 27 mg/dL   Creatinine, Ser 0.78 0.57 - 1.00 mg/dL   GFR calc non Af Amer 76 >59 mL/min/1.73   GFR calc Af Amer 88 >59 mL/min/1.73   BUN/Creatinine Ratio 26 12 - 28   Sodium 140 134 - 144 mmol/L   Potassium 5.3 (H) 3.5 - 5.2 mmol/L   Chloride 103 96 - 106 mmol/L   CO2 20 20 - 29 mmol/L   Calcium 10.0 8.7 - 10.3 mg/dL   Total Protein 6.6 6.0 - 8.5 g/dL   Albumin 4.7 3.5 - 4.8 g/dL   Globulin, Total 1.9 1.5 - 4.5 g/dL   Albumin/Globulin Ratio 2.5 (H) 1.2 - 2.2   Bilirubin Total 0.6 0.0 - 1.2 mg/dL   Alkaline Phosphatase 91 39 - 117 IU/L   AST 17 0 - 40 IU/L   ALT 15 0 - 32 IU/L  Hemoglobin A1c     Status: Abnormal   Collection Time: 03/03/17 11:22 AM  Result Value Ref Range   Hgb A1c MFr Bld 8.1 (H) 4.8 - 5.6 %    Comment:          Prediabetes: 5.7 - 6.4          Diabetes: >6.4          Glycemic control for adults with diabetes: <7.0    Est. average glucose Bld gHb Est-mCnc 186  mg/dL  Lipid panel     Status: None   Collection Time: 03/03/17 11:22 AM  Result Value Ref Range   Cholesterol, Total 144 100 - 199 mg/dL   Triglycerides 144 0 - 149 mg/dL   HDL 58 >39 mg/dL   VLDL Cholesterol Cal 29 5 - 40 mg/dL   LDL Calculated 57 0 - 99 mg/dL   Chol/HDL Ratio 2.5 0.0 - 4.4 ratio    Comment:                                   T. Chol/HDL Ratio  Men  Women                               1/2 Avg.Risk  3.4    3.3                                   Avg.Risk  5.0    4.4                                2X Avg.Risk  9.6    7.1                                3X Avg.Risk 23.4   11.0   TSH     Status: Abnormal   Collection Time: 03/03/17 11:22 AM  Result Value Ref Range   TSH 10.680 (H) 0.450 - 4.500 uIU/mL  T4, free     Status: None   Collection Time: 03/03/17 11:22 AM  Result Value Ref Range   Free T4 1.26 0.82 - 1.77 ng/dL  VITAMIN D 25 Hydroxy (Vit-D Deficiency, Fractures)     Status: None   Collection Time: 03/03/17 11:22 AM  Result Value Ref Range   Vit D, 25-Hydroxy 49.4 30.0 - 100.0 ng/mL    Comment: Vitamin D deficiency has been defined by the [place] and an Endocrine Society practice guideline as a level of serum 25-OH vitamin D less than 20 ng/mL (1,2). The Endocrine Society went on to further define vitamin D insufficiency as a level between 21 and 29 ng/mL (2). 1. IOM (Institute of Medicine). 2010. Dietary reference    intakes for calcium and D. [place]: The    [employer]. 2. Holick MF, Binkley [place], Bischoff-Ferrari HA, et al.    Evaluation, treatment, and prevention of vitamin D    deficiency: an Endocrine Society clinical practice    guideline. JCEM. 2011 Jul; 96(7):1911-30.      Outpatient Encounter Prescriptions as of 03/11/2017  Medication Sig Note  . Cholecalciferol (VITAMIN D3) 5000 units CAPS Take 1 capsule by mouth daily.   . citalopram (CELEXA) 40 MG tablet TAKE 1  TABLET BY MOUTH DAILY. (Patient not taking: Reported on 03/03/2017) 03/03/2017: Patient weaned herself off slowly and has not taken any in abt 1 week.   . fexofenadine (ALLEGRA) 180 MG tablet Take 1 tablet (180 mg total) by mouth daily.   . fluticasone (FLONASE) 50 MCG/ACT nasal spray Do 1 spray each nostril twice daily after your sinus rinses   . glipiZIDE (GLUCOTROL) 10 MG tablet Take 1 tablet (10 mg total) by mouth 2 (two) times daily before a meal.   . levothyroxine (SYNTHROID, LEVOTHROID) 50 MCG tablet Take 1 tablet (50 mcg total) by mouth daily before breakfast.   . losartan (COZAAR) 25 MG tablet Take 1 tablet (25 mg total) by mouth daily.   Marland Kitchen Lysine 1000 MG TABS Take 1 tablet by mouth daily.   . metFORMIN (GLUCOPHAGE) 1000 MG tablet Take 1 tablet (1,000 mg total) by mouth 2 (two) times daily with a meal.   . omeprazole (PRILOSEC) 20 MG capsule TAKE 1 CAPSULE BY MOUTH 2 TIMES A DAY BEFORE A MEAL   . rosuvastatin (CRESTOR) 10 MG tablet Take 1 tablet (10 mg  total) by mouth daily. Take 1 tablet nightly before bedtime   . [DISCONTINUED] metFORMIN (GLUCOPHAGE-XR) 500 MG 24 hr tablet TAKE 2 TABLETS BY MOUTH DAILY WITH BREAKFAST AND 1 TABLET AT BEDTIME.    No facility-administered encounter medications on file as of 03/11/2017.

## 2017-03-14 DIAGNOSIS — L570 Actinic keratosis: Secondary | ICD-10-CM | POA: Diagnosis not present

## 2017-03-15 ENCOUNTER — Telehealth: Payer: Self-pay | Admitting: Family Medicine

## 2017-03-15 ENCOUNTER — Encounter: Payer: Self-pay | Admitting: Family Medicine

## 2017-03-15 ENCOUNTER — Ambulatory Visit (INDEPENDENT_AMBULATORY_CARE_PROVIDER_SITE_OTHER): Payer: Medicare PPO | Admitting: Family Medicine

## 2017-03-15 VITALS — BP 140/84 | HR 76 | Ht 65.5 in | Wt 174.8 lb

## 2017-03-15 DIAGNOSIS — R110 Nausea: Secondary | ICD-10-CM | POA: Insufficient documentation

## 2017-03-15 DIAGNOSIS — E663 Overweight: Secondary | ICD-10-CM

## 2017-03-15 DIAGNOSIS — E118 Type 2 diabetes mellitus with unspecified complications: Secondary | ICD-10-CM

## 2017-03-15 DIAGNOSIS — R0789 Other chest pain: Secondary | ICD-10-CM | POA: Diagnosis not present

## 2017-03-15 DIAGNOSIS — I10 Essential (primary) hypertension: Secondary | ICD-10-CM

## 2017-03-15 DIAGNOSIS — E039 Hypothyroidism, unspecified: Secondary | ICD-10-CM

## 2017-03-15 DIAGNOSIS — E781 Pure hyperglyceridemia: Secondary | ICD-10-CM | POA: Diagnosis not present

## 2017-03-15 DIAGNOSIS — E785 Hyperlipidemia, unspecified: Secondary | ICD-10-CM | POA: Diagnosis not present

## 2017-03-15 DIAGNOSIS — R079 Chest pain, unspecified: Secondary | ICD-10-CM | POA: Insufficient documentation

## 2017-03-15 DIAGNOSIS — E1159 Type 2 diabetes mellitus with other circulatory complications: Secondary | ICD-10-CM | POA: Diagnosis not present

## 2017-03-15 DIAGNOSIS — E1169 Type 2 diabetes mellitus with other specified complication: Secondary | ICD-10-CM

## 2017-03-15 NOTE — Progress Notes (Signed)
Impression and Recommendations:    1. Exertional chest pain   2. Nausea w exertion- relieved with rest   3. Chest pain, unspecified type   4. Type 2 diabetes mellitus with complication, unspecified whether long term insulin use ([place])   5. Hypertension associated with diabetes ([place])   6. Hyperlipidemia associated with type 2 diabetes mellitus ([place])   7. Hypertriglyceridemia   8. Hypothyroidism, unspecified type   9. Overweight (BMI 25.0-29.9)   10. Other chest pain    --After discussion of patient's labs today she agrees to change her metformin from [facility] formulation /24 hour to BID.   We will have her take 1000 mg twice daily.  We will add Glucotrol\glipizide to her regimen as well.   She will take one half tablet twice daily for 1-2 weeks and increase to dose written on bottle as needed to reach goal blood sugars under 1:30 regularly for fasting  - Patient tells me today about some exertional chest pain she's had on and off for over 2 years now.  No current symptoms.  Has associated exertional nausea with it.  The symptoms come on with exertion relieved with rest.  She's never had a stress test or been evaluated for this, as she is a relatively new patient to me.  Referral placed for gated SPECT test today.  - Follow-up in about 3-4 weeks to see how she's doing with the new medicine regimen for her diabetes and to go over her blood sugar levels.  -  We also will see about the results of her cardiac stress test   Education and routine counseling performed. Handouts provided. Pt was in the office today for 40+ minutes, with over 50% time spent in face to face counseling of patients various medical conditions, treatment plans of those medical conditions including medicine management and lifestyle modification, strategies to improve health and well being; and in coordination of care. SEE ABOVE FOR DETAILS   Meds ordered this encounter  Medications  . DISCONTD: metFORMIN (GLUCOPHAGE-XR)  500 MG 24 hr tablet    Sig: Take 500 mg by mouth daily with breakfast. Patient takes 2 tablet in am and 1 at bedtime  . imiquimod (ALDARA) 5 % cream    Sig: Apply 1 application topically 3 (three) times a week.  -->  I did not order the Aldara, this was written for historical purposes only.  This comes from patient's dermatologist.   Modified Medications   No medications on file   Discontinued Medications   METFORMIN (GLUCOPHAGE-XR) 500 MG 24 HR TABLET    Take 500 mg by mouth daily with breakfast. Patient takes 2 tablet in am and 1 at bedtime    Orders Placed This Encounter  Procedures  . MYOCARDIAL PERFUSION IMAGING    Return for 3-4 wks, started new glipizide and change 12hr mtformin, f/up Cardiac Stress.  The patient was counseled, risk factors were discussed, anticipatory guidance given.  Gross side effects, risk and benefits, and alternatives of medications discussed with patient.  Patient is aware that all medications have potential side effects and we are unable to predict every side effect or drug-drug interaction that may occur.  Expresses verbal understanding and consents to current therapy plan and treatment regimen.  Please see AVS handed out to patient at the end of our visit for further patient instructions/ counseling done pertaining to today's office visit.    Note: This document was prepared using Systems analyst and may include  unintentional dictation errors.     Subjective:    Chief Complaint  Patient presents with  . Follow-up     Amanda Douglas is a 71 y.o. female who presents to [facility] today for Diabetes Management.    - Patient is here to review all her recent lab work with me.  She has not started the medication regimen for her diabetes that was told her to start due to last lab results.  Patient wanted talk to me about all the changes and discuss lab work with me prior in person before making any  medication changes  Diabetes: Patient has been taking her metformin XR.  A1c is over 8 the last 6 months.  Hyperlipidemia: LDL at goal.  Hypothyroidism:  TSH elevated but T4 is completely normal.  Patient is asymptomatic.  She takes her medicine along with other medicines during the day which can be having impact on how well the medicines uptake into her system thus resulting in TSH being all over the place.  Hypertension:  Tolerating medicines well taking as prescribed.  Patient has been trying to exercise more as we discussed prior.  She has been trying to go for walks but when she walks 10 minutes she starts to feel "a tightness in her chest "  and then it makes her feel nauseous, and at times she seemed even vomited.  Once she stops the exertion, the symptoms go away.  Today patient tells me this exertional chest pain along with nausea has been going on for about 2 years.  First noticed it when she was traveling in [place] 2 years ago.  It always goes away when she stops exerting herself.  Patient thinks it may be getting worse over the years.   Problem  Exertional Chest Pain  Nausea w exertion- relieved with rest     DM HPI: -  She has been working on diet and exercise for diabetes  Pt is currently maintained on the following medications for diabetes:   see med list today Medication compliance - good  Home glucose readings range  - FBS 149, 140's-150's   Denies polyuria/polydipsia. Denies hypo/ hyperglycemia symptoms - She denies new onset of: chest pain, exercise intolerance, shortness of breath, dizziness, visual changes, headache, lower extremity swelling or claudication.   Last diabetic eye exam was  Lab Results  Component Value Date   HMDIABEYEEXA No Retinopathy 10/27/2016    Foot exam- UTD  Last A1C in the office was:  Lab Results  Component Value Date   HGBA1C 8.1 (H) 03/03/2017   HGBA1C 8.6 10/29/2016   HGBA1C 7.8 04/19/2016    Lab Results  Component Value  Date   MICROALBUR 10 05/26/2016   LDLCALC 57 03/03/2017   CREATININE 0.78 03/03/2017    Last 3 blood pressure readings in our office are as follows: BP Readings from Last 3 Encounters:  03/15/17 140/84  03/03/17 140/84  10/29/16 108/69      Patient Care Team    Relationship Specialty Notifications Start End  [name], DO PCP - General Family Medicine  04/01/16   Margaretmary Bayley, MD Referring Physician Internal Medicine  04/01/16   Gastroenterology, [facility] Physician Gastroenterology  04/19/16    Comment: Patient had endoscopy and colonoscopy earlier in year or late 2016.       Patient Active Problem List   Diagnosis Date Noted  . Hypertension associated with diabetes ([place]) 03/03/2017    Priority: High  .  Hypertriglyceridemia 05/26/2016    Priority: High  . Adjustment disorder with mixed anxiety and depressed mood 04/19/2016    Priority: High  . Hyperlipidemia associated with type 2 diabetes mellitus ([place]) 04/19/2016    Priority: High  . Type 2 diabetes mellitus with complication ([place]) 40/97/3532    Priority: High  . Hypothyroidism 04/19/2016    Priority: Medium  . Overweight (BMI 25.0-29.9) 04/19/2016    Priority: Medium  . Personal history of noncompliance with medical treatment and regimen 04/19/2016    Priority: Medium  . Gastroesophageal reflux disease 04/19/2016    Priority: Medium  . Environmental and seasonal allergies 03/03/2017    Priority: Low  . 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 Basal cell carcinoma (BCC)- chin in 2012 03/03/2017    Priority: Low  . Colonoscopy refused 03/03/2017    Priority: Low  . Hyperkalemia 05/26/2016    Priority: Low  . Vitamin D deficiency 04/19/2016    Priority: Low  . Exertional chest pain 03/15/2017  . Nausea w exertion- relieved with rest 03/15/2017  . Counseling on health promotion and disease prevention 05/26/2016     Past Medical History:  Diagnosis Date  .  Depression   . Diabetes mellitus without complication ([place])   . Hyperlipidemia   . Thyroid disease      Past Surgical History:  Procedure Laterality Date  . ANKLE FRACTURE SURGERY    . CESAREAN SECTION       Family History  Problem Relation Age of Onset  . Diabetes Mother   . Hypertension Mother   . Thyroid disease Mother   . Depression Mother   . COPD Sister   . Arthritis Sister   . Healthy Brother   . Healthy Daughter   . Diabetes Son   . Cancer Sister        squamous cell  . Hypertension Son      History  Drug Use No  ,  History  Alcohol Use No  ,  History  Smoking Status  . Former Smoker  . Packs/day: 1.00  . Years: 15.00  . Types: Cigarettes  . Quit date: 06/14/1977  Smokeless Tobacco  . Never Used  ,    Current Outpatient Prescriptions on File Prior to Visit  Medication Sig Dispense Refill  . Cholecalciferol (VITAMIN D3) 5000 units CAPS Take 1 capsule by mouth daily.    . citalopram (CELEXA) 40 MG tablet TAKE 1 TABLET BY MOUTH DAILY. 90 tablet 0  . fexofenadine (ALLEGRA) 180 MG tablet Take 1 tablet (180 mg total) by mouth daily. 90 tablet 3  . fluticasone (FLONASE) 50 MCG/ACT nasal spray Do 1 spray each nostril twice daily after your sinus rinses 16 g 2  . levothyroxine (SYNTHROID, LEVOTHROID) 50 MCG tablet Take 1 tablet (50 mcg total) by mouth daily before breakfast. 90 tablet 0  . losartan (COZAAR) 25 MG tablet Take 1 tablet (25 mg total) by mouth daily. 90 tablet 4  . Lysine 1000 MG TABS Take 1 tablet by mouth daily.    Marland Kitchen omeprazole (PRILOSEC) 20 MG capsule TAKE 1 CAPSULE BY MOUTH 2 TIMES A DAY BEFORE A MEAL 60 capsule 0  . rosuvastatin (CRESTOR) 10 MG tablet Take 1 tablet (10 mg total) by mouth daily. Take 1 tablet nightly before bedtime 90 tablet 1  . glipiZIDE (GLUCOTROL) 10 MG tablet Take 1 tablet (10 mg total) by mouth 2 (two) times daily before a meal. (Patient not taking: Reported on 03/15/2017)  60 tablet 2  . metFORMIN (GLUCOPHAGE) 1000 MG  tablet Take 1 tablet (1,000 mg total) by mouth 2 (two) times daily with a meal. (Patient not taking: Reported on 03/15/2017) 180 tablet 0   No current facility-administered medications on file prior to visit.      Allergies  Allergen Reactions  . Codeine Nausea And Vomiting     Review of Systems:   General:  Denies fever, chills Optho/Auditory:   Denies visual changes, blurred vision Respiratory:   Denies SOB, cough, wheeze, DIB  Cardiovascular:   Denies chest pain, palpitations, painful respirations Gastrointestinal:   Denies nausea, vomiting, diarrhea.  Endocrine:     Denies new hot or cold intolerance Musculoskeletal:  Denies joint swelling, gait issues, or new unexplained myalgias/ arthralgias Skin:  Denies rash, suspicious lesions  Neurological:    Denies dizziness, unexplained weakness, numbness  Psychiatric/Behavioral:   Denies mood changes    Objective:     Blood pressure 140/84, pulse 76, height 5' 5.5" (1.664 m), weight 174 lb 12.8 oz (79.3 kg).  Body mass index is 28.65 kg/m.  General: Well Developed, well nourished, and in no acute distress.  HEENT: Normocephalic, atraumatic, pupils equal round reactive to light, neck supple, No carotid bruits, no JVD Skin: Warm and dry, cap RF less 2 sec Cardiac: Regular rate and rhythm, S1, S2 WNL's, no murmurs rubs or gallops Respiratory: ECTA B/L, Not using accessory muscles, speaking in full sentences. NeuroM-Sk: Ambulates w/o assistance, moves ext * 4 w/o difficulty, sensation grossly intact.  Ext: scant edema b/l lower ext Psych: No HI/SI, judgement and insight good, Euthymic mood. Full Affect.

## 2017-03-15 NOTE — Telephone Encounter (Signed)
Pt cld to says since she did not pick up the (2) Rx called to [place] on 9/28 they put them back (pt really told pharmacist she was not going to take the Rx till she spk w/provider) -[name] appt w/ Dr. Jenetta Downer this morning 03/15/17 confirmed she was to take both Amanda Douglas now says we will have to renew Rx for  Glipizide 10 MG tablets & Metformin 1000 MG tablets. --glh

## 2017-03-15 NOTE — Patient Instructions (Addendum)
For the glipizide try one half a tablet in the morning and a half a tablet in the evening not the full tab to see what the blood sugars do.  After a week or 2 if tolerated well and blood sugars are not consistently less than 130 fasting, then take the full tablet as written on your bottle.  Also with your TSH being elevated but a normal T4, it is important that you take your thyroid medicine always from all other medications.  Moving, if you take your medicines twice daily, then take your thyroid medicine alone every lunchtime or so.  Do not take your thyroid medicine along with any other medicine esp multivitamins, vitamin D etc.

## 2017-03-16 ENCOUNTER — Telehealth (HOSPITAL_COMMUNITY): Payer: Self-pay | Admitting: *Deleted

## 2017-03-16 NOTE — Telephone Encounter (Signed)
Patient given detailed instructions per Myocardial Perfusion Study Information Sheet for the test on 03/21/17. Patient notified to arrive 15 minutes early and that it is imperative to arrive on time for appointment to keep from having the test rescheduled.  If you need to cancel or reschedule your appointment, please call the office within 24 hours of your appointment. . Patient verbalized understanding. Kirstie Peri

## 2017-03-16 NOTE — Telephone Encounter (Signed)
Spoke with Colletta [place] at [facility] who states that they have both of these medications on hold on her profile and do not need new RXs sent in.  Charyl Bigger, CMA

## 2017-03-17 ENCOUNTER — Telehealth: Payer: Self-pay | Admitting: Family Medicine

## 2017-03-17 NOTE — Telephone Encounter (Signed)
Charmaine from [facility] called 03/17/17 states they need the authorization no# for Amanda Douglas's Nuclear Stress Test scheduled for Monday 03/21/17-- if one has not been obtain before DOS it will need to be rescheduled for a different date & time. --Please call Charmaine at [phone] if any questions. --glh

## 2017-03-18 NOTE — Telephone Encounter (Signed)
I called [employer] and was informed that CPT code [phone] does not need prior Auth reference number is [id number].  And CPT code [phone] they wanted additional clinical information that I had to fax over.  If there is anything else that I need to do please let me know.  Thank you. MPulliam, CMA/RT(R)

## 2017-03-21 ENCOUNTER — Ambulatory Visit (HOSPITAL_COMMUNITY): Payer: Medicare PPO | Attending: Internal Medicine

## 2017-03-21 DIAGNOSIS — R9439 Abnormal result of other cardiovascular function study: Secondary | ICD-10-CM | POA: Diagnosis not present

## 2017-03-21 DIAGNOSIS — R079 Chest pain, unspecified: Secondary | ICD-10-CM

## 2017-03-21 DIAGNOSIS — E119 Type 2 diabetes mellitus without complications: Secondary | ICD-10-CM | POA: Diagnosis not present

## 2017-03-21 DIAGNOSIS — R0609 Other forms of dyspnea: Secondary | ICD-10-CM | POA: Insufficient documentation

## 2017-03-21 DIAGNOSIS — R0789 Other chest pain: Secondary | ICD-10-CM | POA: Diagnosis not present

## 2017-03-21 DIAGNOSIS — I10 Essential (primary) hypertension: Secondary | ICD-10-CM | POA: Insufficient documentation

## 2017-03-21 DIAGNOSIS — R110 Nausea: Secondary | ICD-10-CM

## 2017-03-21 MED ORDER — TECHNETIUM TC 99M TETROFOSMIN IV KIT
11.0000 | PACK | Freq: Once | INTRAVENOUS | Status: AC | PRN
  Administered 2017-03-21: 11 via INTRAVENOUS
  Filled 2017-03-21: qty 11

## 2017-03-23 ENCOUNTER — Ambulatory Visit (HOSPITAL_COMMUNITY): Payer: Medicare PPO | Attending: Cardiology

## 2017-03-23 DIAGNOSIS — R110 Nausea: Secondary | ICD-10-CM | POA: Diagnosis not present

## 2017-03-23 DIAGNOSIS — R9439 Abnormal result of other cardiovascular function study: Secondary | ICD-10-CM | POA: Diagnosis not present

## 2017-03-23 DIAGNOSIS — R0609 Other forms of dyspnea: Secondary | ICD-10-CM | POA: Diagnosis not present

## 2017-03-23 DIAGNOSIS — E119 Type 2 diabetes mellitus without complications: Secondary | ICD-10-CM | POA: Diagnosis not present

## 2017-03-23 DIAGNOSIS — R0789 Other chest pain: Secondary | ICD-10-CM | POA: Diagnosis not present

## 2017-03-23 DIAGNOSIS — I10 Essential (primary) hypertension: Secondary | ICD-10-CM | POA: Diagnosis not present

## 2017-03-23 LAB — MYOCARDIAL PERFUSION IMAGING
CHL CUP NUCLEAR SDS: 6
CHL CUP NUCLEAR SRS: 6
CHL CUP NUCLEAR SSS: 12
CSEPPHR: 100 {beats}/min
LV dias vol: 61 mL (ref 46–106)
LV sys vol: 21 mL
RATE: 0.27
Rest HR: 74 {beats}/min
TID: 0.9

## 2017-03-23 MED ORDER — TECHNETIUM TC 99M TETROFOSMIN IV KIT
32.8000 | PACK | Freq: Once | INTRAVENOUS | Status: AC | PRN
  Administered 2017-03-23: 32.8 via INTRAVENOUS
  Filled 2017-03-23: qty 33

## 2017-03-23 MED ORDER — REGADENOSON 0.4 MG/5ML IV SOLN
0.4000 mg | Freq: Once | INTRAVENOUS | Status: AC
  Administered 2017-03-23: 0.4 mg via INTRAVENOUS

## 2017-04-07 ENCOUNTER — Other Ambulatory Visit: Payer: Self-pay

## 2017-04-07 NOTE — Telephone Encounter (Signed)
Patient states that she had stopped taking the Celexa per discussion with Dr Raliegh Scarlet, but patient states that she had to start taking it again for same symptoms.  Patient is requesting a refill, sent to Dr Raliegh Scarlet for review. MPulliam, CMA/RT(R)

## 2017-04-10 MED ORDER — CITALOPRAM HYDROBROMIDE 40 MG PO TABS: 40.0000 mg | ORAL_TABLET | Freq: Every day | ORAL | 0 refills | Status: DC

## 2017-04-10 NOTE — Telephone Encounter (Signed)
PT NEEDS OV FROM WHEN LAST SEEN in early Oct AS WE STARTED NEW MEDS.     NEEDS TO BRING IN BS LOG AND REVIEW NEW MEDS AS WELL AS DISCUSS MOOD IN ADDITION TO HER DM.   - only 30d supply meds ok- will need OV for future

## 2017-04-13 ENCOUNTER — Telehealth: Payer: Self-pay | Admitting: Family Medicine

## 2017-04-13 NOTE — Telephone Encounter (Signed)
Called listed cell# left message for pt to contact office to set up appt in 30dys--Pt cancelled 11/2 previously appt---  provider required pt to be called.  --glh

## 2017-04-15 ENCOUNTER — Ambulatory Visit: Payer: Medicare PPO | Admitting: Family Medicine

## 2017-04-25 ENCOUNTER — Telehealth: Payer: Self-pay | Admitting: Family Medicine

## 2017-04-25 NOTE — Telephone Encounter (Signed)
--  Office visit note from 10\2\18:    "After discussion of patient's labs today she agrees to change her metformin from the XR formulation /24 hour to the 12 hour.   We will have her take 1000 mg twice daily.  We will add Glucotrol\glipizide to her regimen as well.   She will take one half tablet twice daily for 1-2 weeks and increase to dose written on bottle as needed to reach goal blood sugars under 130 regularly for fasting.  "  -So, did she take 1/2 tablet of the glipizide twice daily and if she tolerated it well, she was told not to increase the dose if blood sugars were well controlled under 130.  So, not sure where she is at now- because she should be at 5 mg twice daily or one half tab of the glipizide if her blood sugars were under 130.  Also in the past she was up to 1500 of the XR metformin and had tolerated it fine but we felt that the XR formula was not as effective as the 12 hours so we went back to the 12-hour.  Please have her try to take the thousand twice daily but if she cannot tolerate 8 it we can decrease the dose-tell her to take half a tablet twice daily.  Remind her she must eat before she takes these medicines and never taken on an empty stomach.   At this time, I think it may be best the patient come in for office visit so we can discuss the medicines, and make sure on the same page moving forward with a new game plan

## 2017-04-25 NOTE — Telephone Encounter (Signed)
Patient called states is experiencing side effect with these (2) medicines:  Metformin 1000 MG tablets - (takes 2xs daily) causing severe diarrhea.  Glipizide 10 MG tablets - (takes 2xs daily) causing weakness & dizziness.  Patient request PCP or medical assistant call her at [phone] with advise on what to do.  --glh

## 2017-04-25 NOTE — Telephone Encounter (Signed)
Spoke to patient she states that the increase in Metformin has caused her diarrhea to worsen.  And Glipizide is causing her sugars to drop and for her to be extremely cold, weak and dizzy.  Please advise. MPulliam, CMA/RT(R)

## 2017-04-25 NOTE — Telephone Encounter (Signed)
Patient called states these (2) medicines causing side effects;  Pt states Metformin/ Glucophage  1000 MG tablets (takes 2xs daily) causing severe diarrhea &   Glipizide/ Glucotrol 10 MG tablets (takes 2xs daily) causing her to be dizzy & weak.  --Pt request provider/ medical assistant call her back w/ advise.  --glh

## 2017-04-25 NOTE — Telephone Encounter (Signed)
Spoke to patient, she states that she was taking 1/2 tab of the Glipizide bid, but the bottle said 1 tab bis.  I explained to the patient that she was suppose to take 1/2 tab bid and increase to 1 tab in sugars over 130.  Patient states that she has not been checking her sugar.  Patient states that she has not taken either medication in 1-2 weeks.  Informed the patient to take metformin 1/2 tab. Bid and she if she can tolerate that dosage. MPulliam, CMA/RT(R)

## 2017-04-27 NOTE — Telephone Encounter (Signed)
Called patient and spoke to her in regards to medications.  She states that she is taking 1/2 tab of the Glipizide bid, started back yesterday and did not have the any of the previous symptoms, she will discontinue again and call the office if symptoms return.  Patient states that she started the Metformin 1/2 tablet bid yesterday and diarrhea is not as bad, she has had one occasion of bad diarrhea.  Patient will call the office to notify of any change.  Patient advised to monitor and log her sugar levels. MPulliam, CMA/RT(R)

## 2017-04-28 ENCOUNTER — Other Ambulatory Visit: Payer: Self-pay | Admitting: Family Medicine

## 2017-04-28 DIAGNOSIS — E039 Hypothyroidism, unspecified: Secondary | ICD-10-CM

## 2017-05-10 ENCOUNTER — Other Ambulatory Visit: Payer: Self-pay | Admitting: Family Medicine

## 2017-06-02 ENCOUNTER — Ambulatory Visit: Payer: Medicare PPO | Admitting: Family Medicine

## 2017-06-13 ENCOUNTER — Other Ambulatory Visit: Payer: Self-pay | Admitting: Family Medicine

## 2017-07-18 ENCOUNTER — Other Ambulatory Visit: Payer: Self-pay | Admitting: Family Medicine

## 2017-07-19 ENCOUNTER — Telehealth: Payer: Self-pay | Admitting: Family Medicine

## 2017-07-19 NOTE — Telephone Encounter (Signed)
Provider required OV before any Rx refills --15dys out.  --glh

## 2017-07-28 ENCOUNTER — Other Ambulatory Visit: Payer: Self-pay | Admitting: Family Medicine

## 2017-07-28 DIAGNOSIS — E039 Hypothyroidism, unspecified: Secondary | ICD-10-CM

## 2017-08-04 ENCOUNTER — Other Ambulatory Visit: Payer: Self-pay | Admitting: Family Medicine

## 2017-08-04 DIAGNOSIS — E118 Type 2 diabetes mellitus with unspecified complications: Secondary | ICD-10-CM

## 2017-08-11 ENCOUNTER — Encounter: Payer: Self-pay | Admitting: Family Medicine

## 2017-08-11 ENCOUNTER — Ambulatory Visit (INDEPENDENT_AMBULATORY_CARE_PROVIDER_SITE_OTHER): Payer: Medicare PPO | Admitting: Family Medicine

## 2017-08-11 VITALS — BP 138/76 | HR 71 | Ht 65.5 in | Wt 174.6 lb

## 2017-08-11 DIAGNOSIS — E1169 Type 2 diabetes mellitus with other specified complication: Secondary | ICD-10-CM

## 2017-08-11 DIAGNOSIS — F39 Unspecified mood [affective] disorder: Secondary | ICD-10-CM | POA: Diagnosis not present

## 2017-08-11 DIAGNOSIS — E039 Hypothyroidism, unspecified: Secondary | ICD-10-CM

## 2017-08-11 DIAGNOSIS — I152 Hypertension secondary to endocrine disorders: Secondary | ICD-10-CM

## 2017-08-11 DIAGNOSIS — E559 Vitamin D deficiency, unspecified: Secondary | ICD-10-CM

## 2017-08-11 DIAGNOSIS — Z9119 Patient's noncompliance with other medical treatment and regimen: Secondary | ICD-10-CM

## 2017-08-11 DIAGNOSIS — I10 Essential (primary) hypertension: Secondary | ICD-10-CM | POA: Diagnosis not present

## 2017-08-11 DIAGNOSIS — F4323 Adjustment disorder with mixed anxiety and depressed mood: Secondary | ICD-10-CM | POA: Diagnosis not present

## 2017-08-11 DIAGNOSIS — E118 Type 2 diabetes mellitus with unspecified complications: Secondary | ICD-10-CM

## 2017-08-11 DIAGNOSIS — E875 Hyperkalemia: Secondary | ICD-10-CM

## 2017-08-11 DIAGNOSIS — E663 Overweight: Secondary | ICD-10-CM

## 2017-08-11 DIAGNOSIS — Z91199 Patient's noncompliance with other medical treatment and regimen due to unspecified reason: Secondary | ICD-10-CM

## 2017-08-11 DIAGNOSIS — E785 Hyperlipidemia, unspecified: Secondary | ICD-10-CM

## 2017-08-11 DIAGNOSIS — E1159 Type 2 diabetes mellitus with other circulatory complications: Secondary | ICD-10-CM

## 2017-08-11 LAB — POCT GLYCOSYLATED HEMOGLOBIN (HGB A1C): Hemoglobin A1C: 7.3

## 2017-08-11 LAB — POCT UA - MICROALBUMIN
Creatinine, POC: 200 mg/dL
Microalbumin Ur, POC: 10 mg/L

## 2017-08-11 NOTE — Progress Notes (Signed)
Impression and Recommendations:    1. Type 2 diabetes mellitus with complication, unspecified whether long term insulin use ([place])   2. Hypertension associated with diabetes ([place])   3. Hyperlipidemia associated with type 2 diabetes mellitus ([place])   4. Mood disorder ([place])   5. Overweight (BMI 25.0-29.9)   6. Hypothyroidism, unspecified type   7. Hyperkalemia   8. Personal history of noncompliance with medical treatment and regimen   9. Vitamin D deficiency    1. DM2: A1c 7.3 today. This is down from 03-03-17 where A1c was 8.1.  -Pt has been intolerant to her metformin XR BID dose due to side effects of diarrhea. She stopped taking this 2-3 months prior.  -Pt is otherwise tolerating glipizide well without complication and is asymptomatic and stable at this time.  -As her A1c is well improved from last OV and she is tolerating her glipizide well, we will not make any further changes to her medication at this time.  -Dietary and exercise guidelines discussed. Recommended daily exercise and healthy diet.  2. HTN- BP well controlled at home at 130/70s. Pt asymptomatic and stable at this time. Continue meds as listed below.   3. HLD- Pt is tolerating her meds well as listed below. Pt asymptomatic and stable at this time. Continue meds as listed below  -Drink adequate amounts of water, equal to half of your body weight in oz of water per day.  4. H/o noncompliance- Not currently an issue. This has improved.   5. Adjustment disorder- Pt is stable at this time and tolerating her meds well. Continue meds as listed below  6. Overweight- discussed daily exercise and recommended pt to lose weight.  7. Hypothyroidism- TSH from 03-03-17 was 10.680. Recheck today. Otherwise sx stable at this time. Pt is compliant with her medications and tolerating this well. Continue meds as listed below.   8. Hyperkalemia- order labs.  9. Vitamin D deficiency- Pt is tolerating her supplements well and sx  stable at this time. Continue supplements as listed below.  -Order blood work: BMP, TSH, T4.    Education and routine counseling performed. Handouts provided.  Orders Placed This Encounter  Procedures  . TSH  . Basic Metabolic Panel (BMET)  . T4, free  . POCT glycosylated hemoglobin (Hb A1C)  . POCT UA - Microalbumin  . HM Diabetes Foot Exam   - Pt was in the office today for 35 + minutes, with over 50% time spent in face to face counseling of patients various medical conditions, treatment plans of those medical conditions including medicine management and lifestyle modification, strategies to improve health and well being; and in coordination of care. SEE ABOVE FOR DETAILS  Return in about 3 months (around 11/08/2017) for diabetes and blood pressure follow up every 32mo  The patient was counseled, risk factors were discussed, anticipatory guidance given.  Gross side effects, risk and benefits, and alternatives of medications discussed with patient.  Patient is aware that all medications have potential side effects and we are unable to predict every side effect or drug-drug interaction that may occur.  Expresses verbal understanding and consents to current therapy plan and treatment regimen.  Please see AVS handed out to patient at the end of our visit for further patient instructions/ counseling done pertaining to today's office visit.    Note: This document was prepared using Dragon voice recognition software and may include unintentional dictation errors.  This document serves as a record of services personally  performed by [name], DO. It was created on her behalf by Mayer Masker, a trained medical scribe. The creation of this record is based on the scribe's personal observations and the provider's statements to them.   I have reviewed the above medical documentation for accuracy and completeness and I concur.  [name] 08/11/17 11:49 AM   Subjective:    Chief  Complaint  Patient presents with  . Follow-up   Amanda Douglas is a 72 y.o. female who presents to [place] at [facility] today for Diabetes Management, cholesterol management, HTN management, and mood.     DM HPI: A1c today 7.3.  -  She has not been working on diet and exercise for diabetes.  Pt is currently maintained on the following medications for diabetes:   see med list today  Medication compliance - NO- she has not been taking metformin XR for 2-3 months (from once a day to twice a day) because she has extreme diarrhea as a side effect. She is still taking glipizide.   Home glucose readings range: it varies: 130, 140, 150s. Fasting blood sugar range: Lowest: 121. Highest 150.   Denies polyuria/polydipsia. Denies hypo/ hyperglycemia symptoms - She denies new onset of: chest pain, exercise intolerance, shortness of breath, dizziness, visual changes, headache, lower extremity swelling or claudication.   CHOL HPI:  -  She  is currently managed with:  See med list from today  Txmnt compliance- yes- she takes her meds daily.   Patient reports very little compliance with low chol/ saturated and trans fat diet.  No exercise  RUQ pain- none.    Muscle aches- none.  No other s-e.  She states she only drinks water when she feels like it.   Last lipid panel as follows:  Lab Results  Component Value Date   CHOL 144 03/03/2017   HDL 58 03/03/2017   LDLCALC 57 03/03/2017   TRIG 144 03/03/2017   CHOLHDL 2.5 03/03/2017    Hepatic Function Latest Ref Rng & Units 03/03/2017 04/19/2016 09/03/2015  Total Protein 6.0 - 8.5 g/dL 6.6 6.3 -  Albumin 3.5 - 4.8 g/dL 4.7 4.2 -  AST 0 - 40 IU/L _0 ALT 0 - 32 IU/L _1 Alk Phosphatase 39 - 117 IU/L 91 70 78  Total Bilirubin 0.0 - 1.2 mg/dL 0.6 0.5 -   HTN HPI:  -  Her blood pressure has been controlled at home.  Pt is checking it at home. BP: 130/70s at home when she checks it  - Patient reports good  compliance with blood pressure medications  - Denies medication S-E   - Smoking Status noted   - She denies new onset of: chest pain, exercise intolerance, shortness of breath, dizziness, visual changes, headache, lower extremity swelling or claudication.   She also had a recent stress test with results WNL.  Last 3 blood pressure readings in our office are as follows: BP Readings from Last 3 Encounters:  08/11/17 138/76  03/15/17 140/84  03/03/17 140/84    Filed Weights   08/11/17 0856  Weight: 174 lb 9.6 oz (79.2 kg)   Mood: She is doing well and denies any stresses out of the ordinary. She has been taking her meds daily.   Depression screen [facility] 2/9 08/11/2017 03/15/2017 03/03/2017 10/29/2016 04/19/2016  Decreased Interest 0 0 0 0 0  Down, Depressed, Hopeless 0 0 0 0 0  PHQ - 2 Score 0 0 0  0 0  Altered sleeping 0 0 - 0 -  Tired, decreased energy 0 0 - 0 -  Change in appetite 0 0 - 0 -  Feeling bad or failure about yourself  0 0 - 0 -  Trouble concentrating 0 0 - 0 -  Moving slowly or fidgety/restless 0 0 - 0 -  Suicidal thoughts 0 0 - 0 -  PHQ-9 Score 0 0 - 0 -  Difficult doing work/chores Not difficult at all Not difficult at all - - -    Last diabetic eye exam was  Lab Results  Component Value Date   HMDIABEYEEXA No Retinopathy 10/27/2016    Foot exam- UTD  Last A1C in the office was:  Lab Results  Component Value Date   HGBA1C 7.3 08/11/2017   HGBA1C 8.1 (H) 03/03/2017   HGBA1C 8.6 10/29/2016    Lab Results  Component Value Date   MICROALBUR 10 08/11/2017   LDLCALC 57 03/03/2017   CREATININE 0.78 03/03/2017   BMI Readings from Last 3 Encounters:  08/11/17 28.61 kg/m  03/15/17 28.65 kg/m  03/03/17 28.55 kg/m   Thyroid: She does not feel she is tired more than normal. She is taking her medicines every morning and does not take this with any other medications or vitamins.   No problems updated.   Patient Care Team    Relationship Specialty  Notifications Start End  [name], DO PCP - General Family Medicine  04/01/16   Margaretmary Bayley, MD Referring Physician Internal Medicine  04/01/16   Gastroenterology, [facility] Physician Gastroenterology  04/19/16    Comment: Patient had endoscopy and colonoscopy earlier in year or late 2016.       Patient Active Problem List   Diagnosis Date Noted  . Hypertension associated with diabetes ([place]) 03/03/2017    Priority: High  . Hypertriglyceridemia 05/26/2016    Priority: High  . Adjustment disorder with mixed anxiety and depressed mood 04/19/2016    Priority: High  . Hyperlipidemia associated with type 2 diabetes mellitus ([place]) 04/19/2016    Priority: High  . Type 2 diabetes mellitus with complication ([place]) 14/97/0263    Priority: High  . Hypothyroidism 04/19/2016    Priority: Medium  . Overweight (BMI 25.0-29.9) 04/19/2016    Priority: Medium  . Personal history of noncompliance with medical treatment and regimen 04/19/2016    Priority: Medium  . Gastroesophageal reflux disease 04/19/2016    Priority: Medium  . Environmental and seasonal allergies 03/03/2017    Priority: Low  . 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 Basal cell carcinoma (BCC)- chin in 2012 03/03/2017    Priority: Low  . Colonoscopy refused 03/03/2017    Priority: Low  . Hyperkalemia 05/26/2016    Priority: Low  . Vitamin D deficiency 04/19/2016    Priority: Low  . Exertional chest pain 03/15/2017  . Nausea w exertion- relieved with rest 03/15/2017  . Counseling on health promotion and disease prevention 05/26/2016     Past Medical History:  Diagnosis Date  . Depression   . Diabetes mellitus without complication ([place])   . Hyperlipidemia   . Thyroid disease      Past Surgical History:  Procedure Laterality Date  . ANKLE FRACTURE SURGERY    . CESAREAN SECTION       Family History  Problem Relation Age of Onset  . Diabetes Mother   . Hypertension  Mother   . Thyroid disease Mother   . Depression  Mother   . COPD Sister   . Arthritis Sister   . Healthy Brother   . Healthy Daughter   . Diabetes Son   . Cancer Sister        squamous cell  . Hypertension Son      Social History   Substance and Sexual Activity  Drug Use No  ,  Social History   Substance and Sexual Activity  Alcohol Use No  ,  Social History   Tobacco Use  Smoking Status Former Smoker  . Packs/day: 1.00  . Years: 15.00  . Pack years: 15.00  . Types: Cigarettes  . Last attempt to quit: 06/14/1977  . Years since quitting: 40.1  Smokeless Tobacco Never Used  ,    Current Outpatient Medications on File Prior to Visit  Medication Sig Dispense Refill  . Cholecalciferol (VITAMIN D3) 5000 units CAPS Take 1 capsule by mouth daily.    . citalopram (CELEXA) 40 MG tablet TAKE 1 TABLET BY MOUTH DAILY. NEEDS OFFICE VISIT FOR FURTHER REFILLS. 15 tablet 0  . fexofenadine (ALLEGRA) 180 MG tablet Take 1 tablet (180 mg total) by mouth daily. 90 tablet 3  . fluticasone (FLONASE) 50 MCG/ACT nasal spray Do 1 spray each nostril twice daily after your sinus rinses 16 g 2  . glipiZIDE (GLUCOTROL) 10 MG tablet Take 1 tablet (10 mg total) by mouth 2 (two) times daily before a meal. ABSOLUTELY NO FURTHER REFILLS UNTIL PATIENT IS SEEN 30 tablet 0  . imiquimod (ALDARA) 5 % cream Apply 1 application topically 3 (three) times a week.    . levothyroxine (SYNTHROID, LEVOTHROID) 50 MCG tablet TAKE 1 TABLET BY MOUTH DAILY BEFORE BREAKFAST. 90 tablet 0  . losartan (COZAAR) 25 MG tablet Take 1 tablet (25 mg total) by mouth daily. 90 tablet 4  . Lysine 1000 MG TABS Take 1 tablet by mouth daily.    Marland Kitchen omeprazole (PRILOSEC) 20 MG capsule TAKE 1 CAPSULE BY MOUTH 2 TIMES A DAY BEFORE A MEAL 60 capsule 0  . rosuvastatin (CRESTOR) 10 MG tablet TAKE 1 TABLET BY MOUTH DAILY. 90 tablet 1   No current facility-administered medications on file prior to visit.      Allergies  Allergen Reactions    . Codeine Nausea And Vomiting  . Metformin And Related Diarrhea     Review of Systems:   General:  Denies fever, chills Optho/Auditory:   Denies visual changes, blurred vision Respiratory:   Denies SOB, cough, wheeze, DIB  Cardiovascular:   Denies chest pain, palpitations, painful respirations Gastrointestinal:   Denies nausea, vomiting, diarrhea.  Endocrine:     Denies new hot or cold intolerance Musculoskeletal:  Denies joint swelling, gait issues, or new unexplained myalgias/ arthralgias Skin:  Denies rash, suspicious lesions  Neurological:    Denies dizziness, unexplained weakness, numbness  Psychiatric/Behavioral:   Denies mood changes    Objective:     Blood pressure 138/76, pulse 71, height 5' 5.5" (1.664 m), weight 174 lb 9.6 oz (79.2 kg), SpO2 98 %. Body mass index is 28.61 kg/m. General: Well Developed, well nourished, and in no acute distress.  HEENT: Normocephalic, atraumatic, pupils equal round reactive to light, neck supple, No carotid bruits, no JVD Skin: Warm and dry, cap RF less 2 sec Cardiac: Regular rate and rhythm, S1, S2 WNL's, no murmurs rubs or gallops Respiratory: ECTA B/L, Not using accessory muscles, speaking in full sentences. NeuroM-Sk: Ambulates w/o assistance, moves ext * 4 w/o difficulty, sensation grossly intact.  Ext:  scant edema b/l lower ext Psych: No HI/SI, judgement and insight good, Euthymic mood. Full Affect.

## 2017-08-11 NOTE — Patient Instructions (Signed)
Please realize, EXERCISE IS MEDICINE!  -  American Heart Association [facility]) guidelines for exercise : If you are in good health, without any medical conditions, you should engage in 150 minutes of moderate intensity aerobic activity per week.  This means you should be huffing and puffing throughout your workout.   Engaging in regular exercise will improve brain function and memory, as well as improve mood, boost immune system and help with weight management.  As well as the other, more well-known effects of exercise such as decreasing blood sugar levels, decreasing blood pressure,  and decreasing bad cholesterol levels/ increasing good cholesterol levels.     -  The AHA strongly endorses consumption of a diet that contains a variety of foods from all the food categories with an emphasis on fruits and vegetables; fat-free and low-fat dairy products; cereal and grain products; legumes and nuts; and fish, poultry, and/or extra lean meats.    Excessive food intake, especially of foods high in saturated and trans fats, sugar, and salt, should be avoided.    Adequate water intake of roughly 1/2 of your weight in pounds, should equal the ounces of water per day you should drink.  So for instance, if you're 200 pounds, that would be 100 ounces of water per day.         Mediterranean Diet  Why follow it? Research shows. . Those who follow the Mediterranean diet have a reduced risk of heart disease  . The diet is associated with a reduced incidence of Parkinson's and Alzheimer's diseases . People following the diet may have longer life expectancies and lower rates of chronic diseases  . The Dietary Guidelines for Americans recommends the Mediterranean diet as an eating plan to promote health and prevent disease  What Is the Mediterranean Diet?  . Healthy eating plan based on typical foods and recipes of Mediterranean-style cooking . The diet is primarily a plant based diet; these foods should make up a  majority of meals   Starches - Plant based foods should make up a majority of meals - They are an important sources of vitamins, minerals, energy, antioxidants, and fiber - Choose whole grains, foods high in fiber and minimally processed items  - Typical grain sources include wheat, oats, barley, corn, brown rice, bulgar, farro, millet, polenta, couscous  - Various types of beans include chickpeas, lentils, fava beans, black beans, white beans   Fruits  Veggies - Large quantities of antioxidant rich fruits & veggies; 6 or more servings  - Vegetables can be eaten raw or lightly drizzled with oil and cooked  - Vegetables common to the traditional Mediterranean Diet include: artichokes, arugula, beets, broccoli, brussel sprouts, cabbage, carrots, celery, collard greens, cucumbers, eggplant, kale, leeks, lemons, lettuce, mushrooms, okra, onions, peas, peppers, potatoes, pumpkin, radishes, rutabaga, shallots, spinach, sweet potatoes, turnips, zucchini - Fruits common to the Mediterranean Diet include: apples, apricots, avocados, cherries, clementines, dates, figs, grapefruits, grapes, melons, nectarines, oranges, peaches, pears, pomegranates, strawberries, tangerines  Fats - Replace butter and margarine with healthy oils, such as olive oil, canola oil, and tahini  - Limit nuts to no more than a handful a day  - Nuts include walnuts, almonds, pecans, pistachios, pine nuts  - Limit or avoid candied, honey roasted or heavily salted nuts - Olives are central to the Mediterranean diet - can be eaten whole or used in a variety of dishes   Meats Protein - Limiting red meat: no more than a few times a month -  When eating red meat: choose lean cuts and keep the portion to the size of deck of cards - Eggs: approx. 0 to 4 times a week  - Fish and lean poultry: at least 2 a week  - Healthy protein sources include, chicken, [place], lean beef, lamb - Increase intake of seafood such as tuna, salmon, trout,  mackerel, shrimp, scallops - Avoid or limit high fat processed meats such as sausage and bacon  Dairy - Include moderate amounts of low fat dairy products  - Focus on healthy dairy such as fat free yogurt, skim milk, low or reduced fat cheese - Limit dairy products higher in fat such as whole or 2% milk, cheese, ice cream  Alcohol - Moderate amounts of red wine is ok  - No more than 5 oz daily for women (all ages) and men older than age 72  - No more than 10 oz of wine daily for men younger than 12  Other - Limit sweets and other desserts  - Use herbs and spices instead of salt to flavor foods  - Herbs and spices common to the traditional Mediterranean Diet include: basil, bay leaves, chives, cloves, cumin, fennel, garlic, lavender, marjoram, mint, oregano, parsley, pepper, rosemary, sage, savory, sumac, tarragon, thyme   It's not just a diet, it's a lifestyle:  . The Mediterranean diet includes lifestyle factors typical of those in the region  . Foods, drinks and meals are best eaten with others and savored . Daily physical activity is important for overall good health . This could be strenuous exercise like running and aerobics . This could also be more leisurely activities such as walking, housework, yard-work, or taking the stairs . Moderation is the key; a balanced and healthy diet accommodates most foods and drinks . Consider portion sizes and frequency of consumption of certain foods   Meal Ideas & Options:  . Breakfast:  o Whole wheat toast or whole wheat English muffins with peanut butter & hard boiled egg o Steel cut oats topped with apples & cinnamon and skim milk  o Fresh fruit: banana, strawberries, melon, berries, peaches  o Smoothies: strawberries, bananas, greek yogurt, peanut butter o Low fat greek yogurt with blueberries and granola  o Egg white omelet with spinach and mushrooms o Breakfast couscous: whole wheat couscous, apricots, skim milk, cranberries  . Sandwiches:   o Hummus and grilled vegetables (peppers, zucchini, squash) on whole wheat bread   o Grilled chicken on whole wheat pita with lettuce, tomatoes, cucumbers or tzatziki  o Tuna salad on whole wheat bread: tuna salad made with greek yogurt, olives, red peppers, capers, green onions o Garlic rosemary lamb pita: lamb sauted with garlic, rosemary, salt & pepper; add lettuce, cucumber, greek yogurt to pita - flavor with lemon juice and black pepper  . Seafood:  o Mediterranean grilled salmon, seasoned with garlic, basil, parsley, lemon juice and black pepper o Shrimp, lemon, and spinach whole-grain pasta salad made with low fat greek yogurt  o Seared scallops with lemon orzo  o Seared tuna steaks seasoned salt, pepper, coriander topped with tomato mixture of olives, tomatoes, olive oil, minced garlic, parsley, green onions and cappers  . Meats:  o Herbed greek chicken salad with kalamata olives, cucumber, feta  o Red bell peppers stuffed with spinach, bulgur, lean ground beef (or lentils) & topped with feta   o Kebabs: skewers of chicken, tomatoes, onions, zucchini, squash  o [place] burgers: made with red onions, mint, dill, lemon juice, feta  cheese topped with roasted red peppers . Vegetarian o Cucumber salad: cucumbers, artichoke hearts, celery, red onion, feta cheese, tossed in olive oil & lemon juice  o Hummus and whole grain pita points with a greek salad (lettuce, tomato, feta, olives, cucumbers, red onion) o Lentil soup with celery, carrots made with vegetable broth, garlic, salt and pepper  o Tabouli salad: parsley, bulgur, mint, scallions, cucumbers, tomato, radishes, lemon juice, olive oil, salt and pepper.    Diabetes Mellitus and Standards of Medical Care  Managing diabetes (diabetes mellitus) can be complicated. Your diabetes treatment may be managed by a team of health care providers, including:  A diet and nutrition specialist (registered dietitian).  A nurse.  A certified  diabetes educator (CDE).  A diabetes specialist (endocrinologist).  An eye doctor.  A primary care provider.  A dentist.  Your health care providers follow a schedule in order to help you get the best quality of care. The following schedule is a general guideline for your diabetes management plan. Your health care providers may also give you more specific instructions.  HbA1c (hemoglobin A1c) test This test provides information about blood sugar (glucose) control over the previous 2-3 months. It is used to check whether your diabetes management plan needs to be adjusted.  If you are meeting your treatment goals, this test is done at least 2 times a year.  If you are not meeting treatment goals or if your treatment goals have changed, this test is done 4 times a year.  Blood pressure test  This test is done at every routine medical visit. For most people, the goal is less than 130/80. Ask your health care provider what your goal blood pressure should be.  Dental and eye exams  Visit your dentist two times a year.  If you have type 1 diabetes, get an eye exam 3-5 years after you are diagnosed, and then once a year after your first exam. ? If you were diagnosed with type 1 diabetes as a child, get an eye exam when you are age 17 or older and have had diabetes for 3-5 years. After the first exam, you should get an eye exam once a year.  If you have type 2 diabetes, have an eye exam as soon as you are diagnosed, and then once a year after your first exam.  Foot care exam  Visual foot exams are done at every routine medical visit. The exams check for cuts, bruises, redness, blisters, sores, or other problems with the feet.  A complete foot exam is done by your health care provider once a year. This exam includes an inspection of the structure and skin of your feet, and a check of the pulses and sensation in your feet. ? Type 1 diabetes: Get your first exam 3-5 years after  diagnosis. ? Type 2 diabetes: Get your first exam as soon as you are diagnosed.  Check your feet every day for cuts, bruises, redness, blisters, or sores. If you have any of these or other problems that are not healing, contact your health care provider.  Kidney function test (urine microalbumin)  This test is done once a year. ? Type 1 diabetes: Get your first test 5 years after diagnosis. ? Type 2 diabetes: Get your first test as soon as you are diagnosed._  If you have chronic kidney disease (CKD), get a serum creatinine and estimated glomerular filtration rate (eGFR) test once a year.  Lipid profile (cholesterol, HDL, LDL, triglycerides)  This test should be done when you are diagnosed with diabetes, and every 5 years after the first test. If you are on medicines to lower your cholesterol, you may need to get this test done every year. ? The goal for LDL is less than 100 mg/dL (5.5 mmol/L). If you are at high risk, the goal is less than 70 mg/dL (3.9 mmol/L). ? The goal for HDL is 40 mg/dL (2.2 mmol/L) for men and 50 mg/dL(2.8 mmol/L) for women. An HDL cholesterol of 60 mg/dL (3.3 mmol/L) or higher gives some protection against heart disease. ? The goal for triglycerides is less than 150 mg/dL (8.3 mmol/L).  Immunizations  The yearly flu (influenza) vaccine is recommended for everyone 6 months or older who has diabetes.  The pneumonia (pneumococcal) vaccine is recommended for everyone 2 years or older who has diabetes. If you are 55 or older, you may get the pneumonia vaccine as a series of two separate shots.  The hepatitis B vaccine is recommended for adults shortly after they have been diagnosed with diabetes.  The Tdap (tetanus, diphtheria, and pertussis) vaccine should be given: ? According to normal childhood vaccination schedules, for children. ? Every 10 years, for adults who have diabetes.  The shingles vaccine is recommended for people who have had chicken pox and are 50  years or older.  Mental and emotional health  Screening for symptoms of eating disorders, anxiety, and depression is recommended at the time of diagnosis and afterward as needed. If your screening shows that you have symptoms (you have a positive screening result), you may need further evaluation and be referred to a mental health care provider.  Diabetes self-management education  Education about how to manage your diabetes is recommended at diagnosis and ongoing as needed.  Treatment plan  Your treatment plan will be reviewed at every medical visit.  Summary  Managing diabetes (diabetes mellitus) can be complicated. Your diabetes treatment may be managed by a team of health care providers.  Your health care providers follow a schedule in order to help you get the best quality of care.  Standards of care including having regular physical exams, blood tests, blood pressure monitoring, immunizations, screening tests, and education about how to manage your diabetes.  Your health care providers may also give you more specific instructions based on your individual health.      Type 2 Diabetes Mellitus, Self Care, Adult Caring for yourself after you have been diagnosed with type 2 diabetes (type 2 diabetes mellitus) means keeping your blood sugar (glucose) under control with a balance of:  Nutrition.  Exercise.  Lifestyle changes.  Medicines or insulin, if necessary.  Support from your team of health care providers and others.  The following information explains what you need to know to manage your diabetes at home. What do I need to do to manage my blood glucose?  Check your blood glucose every day, as often as told by your health care provider.  Contact your health care provider if your blood glucose is above your target for 2 tests in a row.  Have your A1c (hemoglobin A1c) level checked at least two times a year, or as often as told by your health care provider. Your  health care provider will set individualized treatment goals for you. Generally, the goal of treatment is to maintain the following blood glucose levels:  Before meals (preprandial): 80-130 mg/dL (4.4-7.2 mmol/L).  After meals (postprandial): below 180 mg/dL (10 mmol/L).  A1c level: less  than 7%.  What do I need to know about hyperglycemia and hypoglycemia? What is hyperglycemia? Hyperglycemia, also called high blood glucose, occurs when blood glucose is too high.Make sure you know the early signs of hyperglycemia, such as:  Increased thirst.  Hunger.  Feeling very tired.  Needing to urinate more often than usual.  Blurry vision.  What is hypoglycemia? Hypoglycemia, also called low blood glucose, occurswith a blood glucose level at or below 70 mg/dL (3.9 mmol/L). The risk for hypoglycemia increases during or after exercise, during sleep, during illness, and when skipping meals or not eating for a long time (fasting). It is important to know the symptoms of hypoglycemia and treat it right away. Always have a 15-gram rapid-acting carbohydrate snack with you to treat low blood glucose. Family members and close friends should also know the symptoms and should understand how to treat hypoglycemia, in case you are not able to treat yourself. What are the symptoms of hypoglycemia? Hypoglycemia symptoms can include:  Hunger.  Anxiety.  Sweating and feeling clammy.  Confusion.  Dizziness or feeling light-headed.  Sleepiness.  Nausea.  Increased heart rate.  Headache.  Blurry vision.  Seizure.  Nightmares.  Tingling or numbness around the mouth, lips, or tongue.  A change in speech.  Decreased ability to concentrate.  A change in coordination.  Restless sleep.  Tremors or shakes.  Fainting.  Irritability.  How do I treat hypoglycemia?  If you are alert and able to swallow safely, follow the 15:15 rule:  Take 15 grams of a rapid-acting carbohydrate.  Rapid-acting options include: ? 1 tube of glucose gel. ? 3 glucose pills. ? 6-8 pieces of hard candy. ? 4 oz (120 mL) of fruit juice. ? 4 oz (120 mL) of regular (not diet) soda.  Check your blood glucose 15 minutes after you take the carbohydrate.  If the repeat blood glucose level is still at or below 70 mg/dL (3.9 mmol/L), take 15 grams of a carbohydrate again.  If your blood glucose level does not increase above 70 mg/dL (3.9 mmol/L) after 3 tries, seek emergency medical care.  After your blood glucose level returns to normal, eat a meal or a snack within 1 hour.  How do I treat severe hypoglycemia? Severe hypoglycemia is when your blood glucose level is at or below 54 mg/dL (3 mmol/L). Severe hypoglycemia is an emergency. Do not wait to see if the symptoms will go away. Get medical help right away. Call your local emergency services (911 in the [place] Do not drive yourself to the hospital. If you have severe hypoglycemia and you cannot eat or drink, you may need an injection of glucagon. A family member or close friend should learn how to check your blood glucose and how to give you a glucagon injection. Ask your health care provider if you need to have an emergency glucagon injection kit available. Severe hypoglycemia may need to be treated in a hospital. The treatment may include getting glucose through an IV tube. You may also need treatment for the cause of your hypoglycemia. Can having diabetes put me at risk for other conditions? Having diabetes can put you at risk for other long-term (chronic) conditions, such as heart disease and kidney disease. Your health care provider may prescribe medicines to help prevent complications from diabetes. These medicines may include:  Aspirin.  Medicine to lower cholesterol.  Medicine to control blood pressure.  What else can I do to manage my diabetes? Take your diabetes medicines as  told  If your health care provider prescribed insulin or  diabetes medicines, take them every day.  Do not run out of insulin or other diabetes medicines that you take. Plan ahead so you always have these available.  If you use insulin, adjust your dosage based on how physically active you are and what foods you eat. Your health care provider will tell you how to adjust your dosage. Make healthy food choices  The things that you eat and drink affect your blood glucose and your insulin dosage. Making good choices helps to control your diabetes and prevent other health problems. A healthy meal plan includes eating lean proteins, complex carbohydrates, fresh fruits and vegetables, low-fat dairy products, and healthy fats. Make an appointment to see a diet and nutrition specialist (registered dietitian) to help you create an eating plan that is right for you. Make sure that you:  Follow instructions from your health care provider about eating or drinking restrictions.  Drink enough fluid to keep your urine clear or pale yellow.  Eat healthy snacks between nutritious meals.  Track the carbohydrates that you eat. Do this by reading food labels and learning the standard serving sizes of foods.  Follow your sick day plan whenever you cannot eat or drink as usual. Make this plan in advance with your health care provider.  Stay active  Exercise regularly, as told by your health care provider. This may include:  Stretching and doing strength exercises, such as yoga or weightlifting, at least 2 times a week.  Doing at least 150 minutes of moderate-intensity or vigorous-intensity exercise each week. This could be brisk walking, biking, or water aerobics. ? Spread out your activity over at least 3 days of the week. ? Do not go more than 2 days in a row without doing some kind of physical activity.  When you start a new exercise or activity, work with your health care provider to adjust your insulin, medicines, or food intake as needed. Make healthy  lifestyle choices  Do not use any tobacco products, such as cigarettes, chewing tobacco, and e-cigarettes. If you need help quitting, ask your health care provider.  If your health care provider says that alcohol is safe for you, limit alcohol intake to no more than 1 drink per day for nonpregnant women and 2 drinks per day for men. One drink equals 12 oz of beer, 5 oz of wine, or 1 oz of hard liquor.  Learn to manage stress. If you need help with this, ask your health care provider. Care for your body   Keep your immunizations up to date. In addition to getting vaccinations as told by your health care provider, it is recommended that you get vaccinated against the following illnesses: ? The flu (influenza). Get a flu shot every year. ? Pneumonia. ? Hepatitis B.  Schedule an eye exam soon after your diagnosis, and then one time every year after that.  Check your skin and feet every day for cuts, bruises, redness, blisters, or sores. Schedule a foot exam with your health care provider once every year.  Brush your teeth and gums two times a day, and floss at least one time a day. Visit your dentist at least once every 6 months.  Maintain a healthy weight. General instructions  Take over-the-counter and prescription medicines only as told by your health care provider.  Share your diabetes management plan with people in your workplace, school, and household.  Check your urine for ketones when you  are ill and as told by your health care provider.  Ask your health care provider: ? Do I need to meet with a diabetes educator? ? Where can I find a support group for people with diabetes?  Carry a medical alert card or wear medical alert jewelry.  Keep all follow-up visits as told by your health care provider. This is important. Where to find more information: For more information about diabetes, visit:  American Diabetes Association (ADA): www.diabetes.org  American Association of  Diabetes Educators (AADE): www.diabeteseducator.org/patient-resources  This information is not intended to replace advice given to you by your health care provider. Make sure you discuss any questions you have with your health care provider. Document Released: 09/22/2015 Document Revised: 11/06/2015 Document Reviewed: 07/04/2015 Elsevier Interactive Patient Education  2017 [place].      Blood Glucose Monitoring, Adult Monitoring your blood sugar (glucose) helps you manage your diabetes. It also helps you and your health care provider determine how well your diabetes management plan is working. Blood glucose monitoring involves checking your blood glucose as often as directed, and keeping a record (log) of your results over time. Why should I monitor my blood glucose? Checking your blood glucose regularly can:  Help you understand how food, exercise, illnesses, and medicines affect your blood glucose.  Let you know what your blood glucose is at any time. You can quickly tell if you are having low blood glucose (hypoglycemia) or high blood glucose (hyperglycemia).  Help you and your health care provider adjust your medicines as needed.  When should I check my blood glucose? Follow instructions from your health care provider about how often to check your blood glucose.   This may depend on:  The type of diabetes you have.  How well-controlled your diabetes is.  Medicines you are taking.  If you have type 1 diabetes:  Check your blood glucose at least 2 times a day.  Also check your blood glucose: ? Before every insulin injection. ? Before and after exercise. ? Between meals. ? 2 hours after a meal. ? Occasionally between 2:00 a.m. and 3:00 a.m., as directed. ? Before potentially dangerous tasks, like driving or using heavy machinery. ? At bedtime.  You may need to check your blood glucose more often, up to 6-10 times a day: ? If you use an insulin pump. ? If you need  multiple daily injections (MDI). ? If your diabetes is not well-controlled. ? If you are ill. ? If you have a history of severe hypoglycemia. ? If you have a history of not knowing when your blood glucose is getting low (hypoglycemia unawareness).  If you have type 2 diabetes:  If you take insulin or other diabetes medicines, check your blood glucose at least 2 times a day.  If you are on intensive insulin therapy, check your blood glucose at least 4 times a day. Occasionally, you may also need to check between 2:00 a.m. and 3:00 a.m., as directed.  Also check your blood glucose: ? Before and after exercise. ? Before potentially dangerous tasks, like driving or using heavy machinery.  You may need to check your blood glucose more often if: ? Your medicine is being adjusted. ? Your diabetes is not well-controlled. ? You are ill.  What is a blood glucose log?  A blood glucose log is a record of your blood glucose readings. It helps you and your health care provider: ? Look for patterns in your blood glucose over time. ? Adjust  your diabetes management plan as needed.  Every time you check your blood glucose, write down your result and notes about things that may be affecting your blood glucose, such as your diet and exercise for the day.  Most glucose meters store a record of glucose readings in the meter. Some meters allow you to download your records to a computer. How do I check my blood glucose? Follow these steps to get accurate readings of your blood glucose: Supplies needed   Blood glucose meter.  Test strips for your meter. Each meter has its own strips. You must use the strips that come with your meter.  A needle to prick your finger (lancet). Do not use lancets more than once.  A device that holds the lancet (lancing device).  A journal or log book to write down your results.  Procedure  Wash your hands with soap and water.  Prick the side of your finger (not  the tip) with the lancet. Use a different finger each time.  Gently rub the finger until a small drop of blood appears.  Follow instructions that come with your meter for inserting the test strip, applying blood to the strip, and using your blood glucose meter.  Write down your result and any notes.  Alternative testing sites  Some meters allow you to use areas of your body other than your finger (alternative sites) to test your blood.  If you think you may have hypoglycemia, or if you have hypoglycemia unawareness, do not use alternative sites. Use your finger instead.  Alternative sites may not be as accurate as the fingers, because blood flow is slower in these areas. This means that the result you get may be delayed, and it may be different from the result that you would get from your finger.  The most common alternative sites are: ? Forearm. ? Thigh. ? Palm of the hand.  Additional tips  Always keep your supplies with you.  If you have questions or need help, all blood glucose meters have a 24-hour "hotline" number that you can call. You may also contact your health care provider.  After you use a few boxes of test strips, adjust (calibrate) your blood glucose meter by following instructions that came with your meter.    The American Diabetes Association suggests the following targets for most nonpregnant adults with diabetes.  More or less stringent glycemic goals may be appropriate for each individual.  A1C: Less than 7% A1C may also be reported as eAG: Less than 154 mg/dl Before a meal (preprandial plasma glucose): 80-130 mg/dl 1-2 hours after beginning of the meal (Postprandial plasma glucose)*: Less than 180 mg/dl  *Postprandial glucose may be targeted if A1C goals are not met despite reaching preprandial glucose goals.   GOALS in short:  The goals are for the Hgb A1C to be less than 7.0 & blood pressure to be less than 130/80.    It is recommended that all  diabetics are educated on and follow a healthy diabetic diet, exercise for 30 minutes 3-4 times per week (walking, biking, swimming, or machine), monitor blood glucose readings and bring that record with you to be reviewed at your next office visit.     You should be checking fasting blood sugars- especially after you eat poorly or eat really healthy, and also check 2 hour postprandial blood sugars after largest meal of the day.    Write these down and bring in your log at each office visit.  You will need to be seen every 3 months by the provider managing your Diabetes unless told otherwise by that provider.   You will need yearly eye exams from an eye specialist and foot exams to check the nerves of your feet.  Also, your urine should be checked yearly as well to make sure excess protein is not present.   If you are checking your blood pressure at home, please record it and bring it to your next office visit.    Follow the Dietary Approaches to Stop Hypertension (DASH) diet (3 servings of fruit and vegetables daily, whole grains, low sodium, low-fat proteins).  See below.    Lastly, when it comes to your cholesterol, the goal is to have the HDL (good cholesterol) >40, and the LDL (bad cholesterol) <100.   It is recommended that you follow a heart healthy, low saturated and trans-fat diet and exercise for 30 minutes at least 5 times a week.     (( Check out the DASH diet = 1.5 Gram Low Sodium Diet   A 1.5 gram sodium diet restricts the amount of sodium in the diet to no more than 1.5 g or 1500 mg daily.  The American Heart Association recommends Americans over the age of 64 to consume no more than 1500 mg of sodium each day to reduce the risk of developing high blood pressure.  Research also shows that limiting sodium may reduce heart attack and stroke risk.  Many foods contain sodium for flavor and sometimes as a preservative.  When the amount of sodium in a diet needs to be low, it is important  to know what to look for when choosing foods and drinks.  The following includes some information and guidelines to help make it easier for you to adapt to a low sodium diet.    QUICK TIPS  Do not add salt to food.  Avoid convenience items and fast food.  Choose unsalted snack foods.  Buy lower sodium products, often labeled as "lower sodium" or "no salt added."  Check food labels to learn how much sodium is in 1 serving.  When eating at a restaurant, ask that your food be prepared with less salt or none, if possible.    READING FOOD LABELS FOR SODIUM INFORMATION  The nutrition facts label is a good place to find how much sodium is in foods. Look for products with no more than 400 mg of sodium per serving.  Remember that 1.5 g = 1500 mg.  The food label may also list foods as:  Sodium-free: Less than 5 mg in a serving.  Very low sodium: 35 mg or less in a serving.  Low-sodium: 140 mg or less in a serving.  Light in sodium: 50% less sodium in a serving. For example, if a food that usually has 300 mg of sodium is changed to become light in sodium, it will have 150 mg of sodium.  Reduced sodium: 25% less sodium in a serving. For example, if a food that usually has 400 mg of sodium is changed to reduced sodium, it will have 300 mg of sodium.    CHOOSING FOODS  Grains  Avoid: Salted crackers and snack items. Some cereals, including instant hot cereals. Bread stuffing and biscuit mixes. Seasoned rice or pasta mixes.  Choose: Unsalted snack items. Low-sodium cereals, oats, puffed wheat and rice, shredded wheat. English muffins and bread. Pasta.  Meats  Avoid: Salted, canned, smoked, spiced, pickled meats, including fish and poultry.  Bacon, ham, sausage, cold cuts, hot dogs, anchovies.  Choose: Low-sodium canned tuna and salmon. Fresh or frozen meat, poultry, and fish.  Dairy  Avoid: Processed cheese and spreads. Cottage cheese. Buttermilk and condensed milk. Regular cheese.  Choose: Milk.  Low-sodium cottage cheese. Yogurt. Sour cream. Low-sodium cheese.  Fruits and Vegetables  Avoid: Regular canned vegetables. Regular canned tomato sauce and paste. Frozen vegetables in sauces. Olives. Angie Fava. Relishes. Sauerkraut.  Choose: Low-sodium canned vegetables. Low-sodium tomato sauce and paste. Frozen or fresh vegetables. Fresh and frozen fruit.  Condiments  Avoid: Canned and packaged gravies. Worcestershire sauce. Tartar sauce. Barbecue sauce. Soy sauce. Steak sauce. Ketchup. Onion, garlic, and table salt. Meat flavorings and tenderizers.  Choose: Fresh and dried herbs and spices. Low-sodium varieties of mustard and ketchup. Lemon juice. Tabasco sauce. Horseradish.    SAMPLE 1.5 GRAM SODIUM MEAL PLAN:   Breakfast / Sodium (mg)  1 cup low-fat milk / 143 mg  1 whole-wheat English muffin / 240 mg  1 tbs heart-healthy margarine / 153 mg  1 hard-boiled egg / 139 mg  1 small orange / 0 mg  Lunch / Sodium (mg)  1 cup raw carrots / 76 mg  2 tbs no salt added peanut butter / 5 mg  2 slices whole-wheat bread / 270 mg  1 tbs jelly / 6 mg   cup red grapes / 2 mg  Dinner / Sodium (mg)  1 cup whole-wheat pasta / 2 mg  1 cup low-sodium tomato sauce / 73 mg  3 oz lean ground beef / 57 mg  1 small side salad (1 cup raw spinach leaves,  cup cucumber,  cup yellow bell pepper) with 1 tsp olive oil and 1 tsp red wine vinegar / 25 mg  Snack / Sodium (mg)  1 container low-fat vanilla yogurt / 107 mg  3 graham cracker squares / 127 mg  Nutrient Analysis  Calories: 1745  Protein: 75 g  Carbohydrate: 237 g  Fat: 57 g  Sodium: 1425 mg  Document Released: 05/31/2005 Document Revised: 02/10/2011 Document Reviewed: 09/01/2009  ExitCare Patient Information 2012 [place].))    This information is not intended to replace advice given to you by your health care provider. Make sure you discuss any questions you have with your health care provider. Document Released: 06/03/2003 Document  Revised: 12/19/2015 Document Reviewed: 11/10/2015 Elsevier Interactive Patient Education  2017 [employer].

## 2017-08-12 LAB — BASIC METABOLIC PANEL
BUN/Creatinine Ratio: 23 (ref 12–28)
BUN: 22 mg/dL (ref 8–27)
CALCIUM: 9.1 mg/dL (ref 8.7–10.3)
CHLORIDE: 104 mmol/L (ref 96–106)
CO2: 19 mmol/L — ABNORMAL LOW (ref 20–29)
Creatinine, Ser: 0.97 mg/dL (ref 0.57–1.00)
GFR calc Af Amer: 67 mL/min/{1.73_m2} (ref >59)
GFR calc non Af Amer: 58 mL/min/{1.73_m2} — ABNORMAL LOW (ref >59)
GLUCOSE: 141 mg/dL — AB (ref 65–99)
Potassium: 5.2 mmol/L (ref 3.5–5.2)
Sodium: 138 mmol/L (ref 134–144)

## 2017-08-12 LAB — TSH: TSH: 14.75 u[IU]/mL — ABNORMAL HIGH (ref 0.450–4.500)

## 2017-08-12 LAB — T4, FREE: FREE T4: 1.03 ng/dL (ref 0.82–1.77)

## 2017-08-17 ENCOUNTER — Other Ambulatory Visit: Payer: Self-pay

## 2017-08-17 DIAGNOSIS — E039 Hypothyroidism, unspecified: Secondary | ICD-10-CM

## 2017-08-22 ENCOUNTER — Other Ambulatory Visit: Payer: Self-pay | Admitting: Family Medicine

## 2017-08-27 ENCOUNTER — Other Ambulatory Visit: Payer: Self-pay | Admitting: Family Medicine

## 2017-08-27 DIAGNOSIS — E118 Type 2 diabetes mellitus with unspecified complications: Secondary | ICD-10-CM

## 2017-09-12 ENCOUNTER — Ambulatory Visit: Payer: Medicare PPO | Admitting: Endocrinology

## 2017-10-14 ENCOUNTER — Encounter: Payer: Self-pay | Admitting: Endocrinology

## 2017-10-14 ENCOUNTER — Ambulatory Visit: Payer: Medicare PPO | Admitting: Endocrinology

## 2017-10-14 VITALS — BP 136/64 | HR 80 | Wt 176.0 lb

## 2017-10-14 DIAGNOSIS — E039 Hypothyroidism, unspecified: Secondary | ICD-10-CM

## 2017-10-14 LAB — TSH: TSH: 6.95 u[IU]/mL — ABNORMAL HIGH (ref 0.35–4.50)

## 2017-10-14 LAB — T4, FREE: Free T4: 0.86 ng/dL (ref 0.60–1.60)

## 2017-10-14 MED ORDER — LEVOTHYROXINE SODIUM 75 MCG PO TABS: 75.0000 ug | ORAL_TABLET | Freq: Every day | ORAL | 5 refills | Status: DC

## 2017-10-14 NOTE — Progress Notes (Signed)
Subjective:    Patient ID: Amanda Douglas, female    DOB: 05-30-1946, 72 y.o.   MRN: [id number]  HPI Pt is referred by Dr Raliegh Scarlet, for hypothyroidism.  Pt reports hypothyroidism was dx'ed in 2010.  she has been on prescribed thyroid hormone therapy since then.  she has never taken kelp or any other type of non-prescribed thyroid product.  she has never had dedicated thyroid imaging.  She has never had thyroid surgery, or XRT to the neck.  she has never been on amiodarone or lithium.  Synthroid dosage has varied from 50-100 mcg/d.  She reports slight fatigue, and assoc weight gain.   Past Medical History:  Diagnosis Date  . Depression   . Diabetes mellitus without complication ([place])   . Hyperlipidemia   . Thyroid disease     Past Surgical History:  Procedure Laterality Date  . ANKLE FRACTURE SURGERY    . CESAREAN SECTION      Social History   Socioeconomic History  . Marital status: Married    Spouse name: Not on file  . Number of children: Not on file  . Years of education: Not on file  . Highest education level: Not on file  Occupational History  . Not on file  Social Needs  . Financial resource strain: Not on file  . Food insecurity:    Worry: Not on file    Inability: Not on file  . Transportation needs:    Medical: Not on file    Non-medical: Not on file  Tobacco Use  . Smoking status: Former Smoker    Packs/day: 1.00    Years: 15.00    Pack years: 15.00    Types: Cigarettes    Last attempt to quit: 06/14/1977    Years since quitting: 40.3  . Smokeless tobacco: Never Used  Substance and Sexual Activity  . Alcohol use: No  . Drug use: No  . Sexual activity: Never  Lifestyle  . Physical activity:    Days per week: Not on file    Minutes per session: Not on file  . Stress: Not on file  Relationships  . Social connections:    Talks on phone: Not on file    Gets together: Not on file    Attends religious service: Not on file    Active member of club or  organization: Not on file    Attends meetings of clubs or organizations: Not on file    Relationship status: Not on file  . Intimate partner violence:    Fear of current or ex partner: Not on file    Emotionally abused: Not on file    Physically abused: Not on file    Forced sexual activity: Not on file  Other Topics Concern  . Not on file  Social History Narrative  . Not on file    Current Outpatient Medications on File Prior to Visit  Medication Sig Dispense Refill  . Cholecalciferol (VITAMIN D3) 5000 units CAPS Take 1 capsule by mouth daily.    . citalopram (CELEXA) 40 MG tablet Take 1 tablet (40 mg total) by mouth daily. 90 tablet 1  . fexofenadine (ALLEGRA) 180 MG tablet Take 1 tablet (180 mg total) by mouth daily. 90 tablet 3  . fluticasone (FLONASE) 50 MCG/ACT nasal spray Do 1 spray each nostril twice daily after your sinus rinses 16 g 2  . glipiZIDE (GLUCOTROL) 10 MG tablet TAKE 1 TABLET BY MOUTH 2 TIMES DAILY BEFORE A MEAL. ABSOLUTELY  NO FURTHER REFILLS UNTIL PATIENT IS SEEN. 90 tablet 1  . losartan (COZAAR) 25 MG tablet Take 1 tablet (25 mg total) by mouth daily. 90 tablet 4  . Lysine 1000 MG TABS Take 1 tablet by mouth daily.    Marland Kitchen omeprazole (PRILOSEC) 20 MG capsule TAKE 1 CAPSULE BY MOUTH 2 TIMES A DAY BEFORE A MEAL 60 capsule 0  . rosuvastatin (CRESTOR) 10 MG tablet TAKE 1 TABLET BY MOUTH DAILY. 90 tablet 1  . imiquimod (ALDARA) 5 % cream Apply 1 application topically 3 (three) times a week.     No current facility-administered medications on file prior to visit.     Allergies  Allergen Reactions  . Codeine Nausea And Vomiting  . Metformin And Related Diarrhea    Family History  Problem Relation Age of Onset  . Diabetes Mother   . Hypertension Mother   . Thyroid disease Mother   . Depression Mother   . COPD Sister   . Arthritis Sister   . Healthy Brother   . Healthy Daughter   . Diabetes Son   . Cancer Sister        squamous cell  . Hypertension Son      BP 136/64   Pulse 80   Wt 176 lb (79.8 kg)   SpO2 97%   BMI 28.84 kg/m     Review of Systems denies depression, sob, memory loss, numbness, blurry vision, cold intolerance, myalgias, rhinorrhea, and syncope.  She has dry skin, easy bruising, leg cramps, constipation, and hair loss.  Depression is well-controlled.       Objective:   Physical Exam VS: see vs page GEN: no distress HEAD: head: no deformity eyes: no periorbital swelling, no proptosis external nose and ears are normal mouth: no lesion seen NECK: supple, thyroid is not enlarged CHEST WALL: no deformity LUNGS: clear to auscultation CV: reg rate and rhythm, no murmur ABD: abdomen is soft, nontender.  no hepatosplenomegaly.  not distended.  no hernia MUSCULOSKELETAL: muscle bulk and strength are grossly normal.  no obvious joint swelling.  gait is normal and steady EXTEMITIES: no deformity.  no ulcer on the feet.  feet are of normal color and temp.  no edema PULSES: dorsalis pedis intact bilat.  no carotid bruit NEURO:  cn 2-12 grossly intact.   readily moves all 4's.  sensation is intact to touch on the feet SKIN:  Normal texture and temperature.  No rash or suspicious lesion is visible.   NODES:  None palpable at the neck PSYCH: alert, well-oriented.  Does not appear anxious nor depressed.    CT (2014): thyroid gland is unremarkable  I have reviewed outside records, and summarized: Pt was noted to have abnormal TFT, and referred here.  It was noted that TSH has fluctuated widely, even with little or no dosage change.  Lab Results  Component Value Date   TSH 6.95 (H) 10/14/2017      Assessment & Plan:  Hypothyroidism.  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  Increase synthroid.  Recheck TSH in 30d.   Patient Instructions  Thyroid blood tests are requested for you today.  We'll let you know about the results. The differences are probably due to your fluctuating immunity.  With time though,  your need for medication will stabilize.  We'll try to make just small medication adjustments whenever we can.      Levothyroxine tablets What is this medicine? LEVOTHYROXINE (lee voe thye ROX een) is a thyroid  hormone. This medicine can improve symptoms of thyroid deficiency such as slow speech, lack of energy, weight gain, hair loss, dry skin, and feeling cold. It also helps to treat goiter (an enlarged thyroid gland). It is also used to treat some kinds of thyroid cancer along with surgery and other medicines. This medicine may be used for other purposes; ask your health care provider or pharmacist if you have questions. COMMON BRAND NAME(S): Estre, Levo-T, Levothroid, Levoxyl, Synthroid, Thyro-Tabs, Unithroid What should I tell my health care provider before I take this medicine? They need to know if you have any of these conditions: -angina -blood clotting problems -diabetes -dieting or on a weight loss program -fertility problems -heart disease -high levels of thyroid hormone -pituitary gland problem -previous heart attack -an unusual or allergic reaction to levothyroxine, thyroid hormones, other medicines, foods, dyes, or preservatives -pregnant or trying to get pregnant -breast-feeding How should I use this medicine? Take this medicine by mouth with plenty of water. It is best to take on an empty stomach, at least 30 minutes before or 2 hours after food. Follow the directions on the prescription label. Take at the same time each day. Do not take your medicine more often than directed. Contact your pediatrician regarding the use of this medicine in children. While this drug may be prescribed for children and infants as young as a few days of age for selected conditions, precautions do apply. For infants, you may crush the tablet and place in a small amount of (5-10 ml or 1 to 2 teaspoonfuls) of water, breast milk, or non-soy based infant formula. Do not mix with soy-based infant  formula. Give as directed. Overdosage: If you think you have taken too much of this medicine contact a poison control center or emergency room at once. NOTE: This medicine is only for you. Do not share this medicine with others. What if I miss a dose? If you miss a dose, take it as soon as you can. If it is almost time for your next dose, take only that dose. Do not take double or extra doses. What may interact with this medicine? -amiodarone -antacids -anti-thyroid medicines -calcium supplements -carbamazepine -cholestyramine -colestipol -digoxin -female hormones, including contraceptive or birth control pills -iron supplements -ketamine -liquid nutrition products like Ensure -medicines for colds and breathing difficulties -medicines for diabetes -medicines for mental depression -medicines or herbals used to decrease weight or appetite -phenobarbital or other barbiturate medications -phenytoin -prednisone or other corticosteroids -rifabutin -rifampin -soy isoflavones -sucralfate -theophylline -warfarin This list may not describe all possible interactions. Give your health care provider a list of all the medicines, herbs, non-prescription drugs, or dietary supplements you use. Also tell them if you smoke, drink alcohol, or use illegal drugs. Some items may interact with your medicine. What should I watch for while using this medicine? Be sure to take this medicine with plenty of fluids. Some tablets may cause choking, gagging, or difficulty swallowing from the tablet getting stuck in your throat. Most of these problems disappear if the medicine is taken with the right amount of water or other fluids. Do not switch brands of this medicine unless your health care professional agrees with the change. Ask questions if you are uncertain. You will need regular exams and occasional blood tests to check the response to treatment. If you are receiving this medicine for an underactive  thyroid, it may be several weeks before you notice an improvement. Check with your doctor or health care professional if  your symptoms do not improve. It may be necessary for you to take this medicine for the rest of your life. Do not stop using this medicine unless your doctor or health care professional advises you to. This medicine can affect blood sugar levels. If you have diabetes, check your blood sugar as directed. You may lose some of your hair when you first start treatment. With time, this usually corrects itself. If you are going to have surgery, tell your doctor or health care professional that you are taking this medicine. What side effects may I notice from receiving this medicine? Side effects that you should report to your doctor or health care professional as soon as possible: -allergic reactions like skin rash, itching or hives, swelling of the face, lips, or tongue -chest pain -excessive sweating or intolerance to heat -fast or irregular heartbeat -nervousness -skin rash or hives -swelling of ankles, feet, or legs -tremors Side effects that usually do not require medical attention (report to your doctor or health care professional if they continue or are bothersome): -changes in appetite -changes in menstrual periods -diarrhea -hair loss -headache -trouble sleeping -weight loss This list may not describe all possible side effects. Call your doctor for medical advice about side effects. You may report side effects to FDA at 1-800-FDA-1088. Where should I keep my medicine? Keep out of the reach of children. Store at room temperature between 15 and 30 degrees C (59 and 86 degrees F). Protect from light and moisture. Keep container tightly closed. Throw away any unused medicine after the expiration date. NOTE: This sheet is a summary. It may not cover all possible information. If you have questions about this medicine, talk to your doctor, pharmacist, or health care  provider.  2018 Elsevier/Gold Standard (2008-09-06 14:28:07)

## 2017-10-14 NOTE — Patient Instructions (Addendum)
Thyroid blood tests are requested for you today.  We'll let you know about the results. The differences are probably due to your fluctuating immunity.  With time though, your need for medication will stabilize.  We'll try to make just small medication adjustments whenever we can.      Levothyroxine tablets What is this medicine? LEVOTHYROXINE (lee voe thye ROX een) is a thyroid hormone. This medicine can improve symptoms of thyroid deficiency such as slow speech, lack of energy, weight gain, hair loss, dry skin, and feeling cold. It also helps to treat goiter (an enlarged thyroid gland). It is also used to treat some kinds of thyroid cancer along with surgery and other medicines. This medicine may be used for other purposes; ask your health care provider or pharmacist if you have questions. COMMON BRAND NAME(S): Estre, Levo-T, Levothroid, Levoxyl, Synthroid, Thyro-Tabs, Unithroid What should I tell my health care provider before I take this medicine? They need to know if you have any of these conditions: -angina -blood clotting problems -diabetes -dieting or on a weight loss program -fertility problems -heart disease -high levels of thyroid hormone -pituitary gland problem -previous heart attack -an unusual or allergic reaction to levothyroxine, thyroid hormones, other medicines, foods, dyes, or preservatives -pregnant or trying to get pregnant -breast-feeding How should I use this medicine? Take this medicine by mouth with plenty of water. It is best to take on an empty stomach, at least 30 minutes before or 2 hours after food. Follow the directions on the prescription label. Take at the same time each day. Do not take your medicine more often than directed. Contact your pediatrician regarding the use of this medicine in children. While this drug may be prescribed for children and infants as young as a few days of age for selected conditions, precautions do apply. For infants, you may  crush the tablet and place in a small amount of (5-10 ml or 1 to 2 teaspoonfuls) of water, breast milk, or non-soy based infant formula. Do not mix with soy-based infant formula. Give as directed. Overdosage: If you think you have taken too much of this medicine contact a poison control center or emergency room at once. NOTE: This medicine is only for you. Do not share this medicine with others. What if I miss a dose? If you miss a dose, take it as soon as you can. If it is almost time for your next dose, take only that dose. Do not take double or extra doses. What may interact with this medicine? -amiodarone -antacids -anti-thyroid medicines -calcium supplements -carbamazepine -cholestyramine -colestipol -digoxin -female hormones, including contraceptive or birth control pills -iron supplements -ketamine -liquid nutrition products like Ensure -medicines for colds and breathing difficulties -medicines for diabetes -medicines for mental depression -medicines or herbals used to decrease weight or appetite -phenobarbital or other barbiturate medications -phenytoin -prednisone or other corticosteroids -rifabutin -rifampin -soy isoflavones -sucralfate -theophylline -warfarin This list may not describe all possible interactions. Give your health care provider a list of all the medicines, herbs, non-prescription drugs, or dietary supplements you use. Also tell them if you smoke, drink alcohol, or use illegal drugs. Some items may interact with your medicine. What should I watch for while using this medicine? Be sure to take this medicine with plenty of fluids. Some tablets may cause choking, gagging, or difficulty swallowing from the tablet getting stuck in your throat. Most of these problems disappear if the medicine is taken with the right amount of water or other fluids.  Do not switch brands of this medicine unless your health care professional agrees with the change. Ask questions if  you are uncertain. You will need regular exams and occasional blood tests to check the response to treatment. If you are receiving this medicine for an underactive thyroid, it may be several weeks before you notice an improvement. Check with your doctor or health care professional if your symptoms do not improve. It may be necessary for you to take this medicine for the rest of your life. Do not stop using this medicine unless your doctor or health care professional advises you to. This medicine can affect blood sugar levels. If you have diabetes, check your blood sugar as directed. You may lose some of your hair when you first start treatment. With time, this usually corrects itself. If you are going to have surgery, tell your doctor or health care professional that you are taking this medicine. What side effects may I notice from receiving this medicine? Side effects that you should report to your doctor or health care professional as soon as possible: -allergic reactions like skin rash, itching or hives, swelling of the face, lips, or tongue -chest pain -excessive sweating or intolerance to heat -fast or irregular heartbeat -nervousness -skin rash or hives -swelling of ankles, feet, or legs -tremors Side effects that usually do not require medical attention (report to your doctor or health care professional if they continue or are bothersome): -changes in appetite -changes in menstrual periods -diarrhea -hair loss -headache -trouble sleeping -weight loss This list may not describe all possible side effects. Call your doctor for medical advice about side effects. You may report side effects to FDA at 1-800-FDA-1088. Where should I keep my medicine? Keep out of the reach of children. Store at room temperature between 15 and 30 degrees C (59 and 86 degrees F). Protect from light and moisture. Keep container tightly closed. Throw away any unused medicine after the expiration date. NOTE:  This sheet is a summary. It may not cover all possible information. If you have questions about this medicine, talk to your doctor, pharmacist, or health care provider.  2018 Elsevier/Gold Standard (2008-09-06 14:28:07)

## 2017-10-18 ENCOUNTER — Other Ambulatory Visit: Payer: Self-pay

## 2017-11-11 ENCOUNTER — Ambulatory Visit: Payer: Medicare PPO | Admitting: Family Medicine

## 2017-11-15 ENCOUNTER — Ambulatory Visit: Payer: Medicare PPO | Admitting: Endocrinology

## 2017-11-15 ENCOUNTER — Other Ambulatory Visit: Payer: Medicare PPO

## 2017-11-15 ENCOUNTER — Encounter: Payer: Self-pay | Admitting: Endocrinology

## 2017-11-15 VITALS — BP 140/82 | HR 97 | Wt 174.8 lb

## 2017-11-15 DIAGNOSIS — E039 Hypothyroidism, unspecified: Secondary | ICD-10-CM

## 2017-11-15 DIAGNOSIS — E069 Thyroiditis, unspecified: Secondary | ICD-10-CM | POA: Diagnosis not present

## 2017-11-15 DIAGNOSIS — E118 Type 2 diabetes mellitus with unspecified complications: Secondary | ICD-10-CM

## 2017-11-15 LAB — T4, FREE: Free T4: 0.99 ng/dL (ref 0.60–1.60)

## 2017-11-15 LAB — POCT GLYCOSYLATED HEMOGLOBIN (HGB A1C): HEMOGLOBIN A1C: 7.4 % — AB (ref 4.0–5.6)

## 2017-11-15 LAB — TSH: TSH: 1.99 u[IU]/mL (ref 0.35–4.50)

## 2017-11-15 NOTE — Progress Notes (Signed)
Subjective:    Patient ID: Amanda Douglas, female    DOB: 1945/11/29, 72 y.o.   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 Since on 75 mcg/d, pt states she feels well in general, except for dry skin.  Past Medical History:  Diagnosis Date  . Depression   . Diabetes mellitus without complication ([place])   . Hyperlipidemia   . Thyroid disease     Past Surgical History:  Procedure Laterality Date  . ANKLE FRACTURE SURGERY    . CESAREAN SECTION      Social History   Socioeconomic History  . Marital status: Married    Spouse name: Not on file  . Number of children: Not on file  . Years of education: Not on file  . Highest education level: Not on file  Occupational History  . Not on file  Social Needs  . Financial resource strain: Not on file  . Food insecurity:    Worry: Not on file    Inability: Not on file  . Transportation needs:    Medical: Not on file    Non-medical: Not on file  Tobacco Use  . Smoking status: Former Smoker    Packs/day: 1.00    Years: 15.00    Pack years: 15.00    Types: Cigarettes    Last attempt to quit: 06/14/1977    Years since quitting: 40.4  . Smokeless tobacco: Never Used  Substance and Sexual Activity  . Alcohol use: No  . Drug use: No  . Sexual activity: Never  Lifestyle  . Physical activity:    Days per week: Not on file    Minutes per session: Not on file  . Stress: Not on file  Relationships  . Social connections:    Talks on phone: Not on file    Gets together: Not on file    Attends religious service: Not on file    Active member of club or organization: Not on file    Attends meetings of clubs or organizations: Not on file    Relationship status: Not on file  . Intimate partner violence:    Fear of current or ex partner: Not on file    Emotionally abused: Not on file   Physically abused: Not on file    Forced sexual activity: Not on file  Other Topics Concern  . Not on file  Social History Narrative  . Not on file    Current Outpatient Medications on File Prior to Visit  Medication Sig Dispense Refill  . Cholecalciferol (VITAMIN D3) 5000 units CAPS Take 1 capsule by mouth daily.    . citalopram (CELEXA) 40 MG tablet Take 1 tablet (40 mg total) by mouth daily. 90 tablet 1  . fexofenadine (ALLEGRA) 180 MG tablet Take 1 tablet (180 mg total) by mouth daily. 90 tablet 3  . fluticasone (FLONASE) 50 MCG/ACT nasal spray Do 1 spray each nostril twice daily after your sinus rinses 16 g 2  . glipiZIDE (GLUCOTROL) 10 MG tablet TAKE 1 TABLET BY MOUTH 2 TIMES DAILY BEFORE A MEAL. ABSOLUTELY NO FURTHER REFILLS UNTIL PATIENT IS SEEN. 90 tablet 1  . imiquimod (ALDARA) 5 % cream Apply 1 application topically 3 (three) times a week.    . levothyroxine (SYNTHROID, LEVOTHROID) 75 MCG tablet Take 1 tablet (75 mcg total) by mouth daily before breakfast. 30 tablet  5  . losartan (COZAAR) 25 MG tablet Take 1 tablet (25 mg total) by mouth daily. 90 tablet 4  . Lysine 1000 MG TABS Take 1 tablet by mouth daily.    Marland Kitchen omeprazole (PRILOSEC) 20 MG capsule TAKE 1 CAPSULE BY MOUTH 2 TIMES A DAY BEFORE A MEAL 60 capsule 0  . rosuvastatin (CRESTOR) 10 MG tablet TAKE 1 TABLET BY MOUTH DAILY. 90 tablet 1   No current facility-administered medications on file prior to visit.     Allergies  Allergen Reactions  . Codeine Nausea And Vomiting  . Metformin And Related Diarrhea    Family History  Problem Relation Age of Onset  . Diabetes Mother   . Hypertension Mother   . Thyroid disease Mother   . Depression Mother   . COPD Sister   . Arthritis Sister   . Healthy Brother   . Healthy Daughter   . Diabetes Son   . Cancer Sister        squamous cell  . Hypertension Son     BP 140/82   Pulse 97   Wt 174 lb 12.8 oz (79.3 kg)   SpO2 92%   BMI 28.65 kg/m   Review of  Systems Denies fatigue    Objective:   Physical Exam VITAL SIGNS:  See vs page GENERAL: no distress NECK: There is no palpable thyroid enlargement.  No thyroid nodule is palpable.  No palpable lymphadenopathy at the anterior neck.   Lab Results  Component Value Date   TSH 1.99 11/15/2017   Lab Results  Component Value Date   HGBA1C 7.4 (A) 11/15/2017      Assessment & Plan:  Hypothyroidism: well-replaced Thyroiditis: she has variable TFT, even without and change in rx--we'll follow this.   DM: this A1c was checked by mistake, as she does not come here for DM.    Patient Instructions  Thyroid blood tests are requested for you today.  We'll let you know about the results. The differences are probably due to your fluctuating immunity.  With time though, your need for medication will stabilize.  Please see Dr Raliegh Scarlet, to follow up your diabetes.   We'll try to make just small thyroid medication adjustments whenever we can.

## 2017-11-15 NOTE — Patient Instructions (Signed)
Thyroid blood tests are requested for you today.  We'll let you know about the results. The differences are probably due to your fluctuating immunity.  With time though, your need for medication will stabilize.  Please see Dr Opalski, to follow up your diabetes.   We'll try to make just small thyroid medication adjustments whenever we can.   

## 2017-11-23 ENCOUNTER — Other Ambulatory Visit: Payer: Self-pay | Admitting: Family Medicine

## 2017-11-23 DIAGNOSIS — E118 Type 2 diabetes mellitus with unspecified complications: Secondary | ICD-10-CM

## 2017-12-05 ENCOUNTER — Ambulatory Visit (INDEPENDENT_AMBULATORY_CARE_PROVIDER_SITE_OTHER): Payer: Medicare PPO | Admitting: Family Medicine

## 2017-12-05 VITALS — BP 140/78 | HR 73 | Ht 66.0 in | Wt 175.0 lb

## 2017-12-05 DIAGNOSIS — K219 Gastro-esophageal reflux disease without esophagitis: Secondary | ICD-10-CM | POA: Diagnosis not present

## 2017-12-05 DIAGNOSIS — E118 Type 2 diabetes mellitus with unspecified complications: Secondary | ICD-10-CM

## 2017-12-05 DIAGNOSIS — E1159 Type 2 diabetes mellitus with other circulatory complications: Secondary | ICD-10-CM | POA: Diagnosis not present

## 2017-12-05 DIAGNOSIS — K449 Diaphragmatic hernia without obstruction or gangrene: Secondary | ICD-10-CM | POA: Diagnosis not present

## 2017-12-05 DIAGNOSIS — R110 Nausea: Secondary | ICD-10-CM

## 2017-12-05 DIAGNOSIS — E663 Overweight: Secondary | ICD-10-CM

## 2017-12-05 DIAGNOSIS — I10 Essential (primary) hypertension: Secondary | ICD-10-CM | POA: Diagnosis not present

## 2017-12-05 DIAGNOSIS — Z9119 Patient's noncompliance with other medical treatment and regimen: Secondary | ICD-10-CM

## 2017-12-05 DIAGNOSIS — E785 Hyperlipidemia, unspecified: Secondary | ICD-10-CM

## 2017-12-05 DIAGNOSIS — Z7189 Other specified counseling: Secondary | ICD-10-CM | POA: Diagnosis not present

## 2017-12-05 DIAGNOSIS — E1169 Type 2 diabetes mellitus with other specified complication: Secondary | ICD-10-CM | POA: Diagnosis not present

## 2017-12-05 DIAGNOSIS — Z91199 Patient's noncompliance with other medical treatment and regimen due to unspecified reason: Secondary | ICD-10-CM

## 2017-12-05 MED ORDER — RANITIDINE HCL 300 MG PO TABS: ORAL_TABLET | ORAL | 1 refills | Status: DC

## 2017-12-05 MED ORDER — GLIPIZIDE 10 MG PO TABS: ORAL_TABLET | ORAL | 3 refills | Status: DC

## 2017-12-05 NOTE — Progress Notes (Signed)
Impression and Recommendations:    1. Type 2 diabetes mellitus with complication, unspecified whether long term insulin use ([place])   2. Hypertension associated with diabetes ([place])   3. Hyperlipidemia associated with type 2 diabetes mellitus ([place])   4. Overweight (BMI 25.0-29.9)   5. Personal history of noncompliance with medical treatment and regimen   6. Counseling on health promotion and disease prevention   7. Nausea w exertion- relieved with rest   8. Gastroesophageal reflux disease, esophagitis presence not specified   9. Hiatal hernia     1. Diabetes Mellitus - Controlled at this time; no changes made to treatment. - Advised importance of lifestyle modification.  Will continue to monitor.  - Referral to nutrition placed today.  - Reviewed patient's declining A1c - down to 7.4 from 8.1 nine months ago. - Patient advised that good control is under 7.5; great control is under 7.0.  - Counseled patient on pathophysiology of disease and discussed various treatment options, with dietary and lifestyle modifications as first line.  Importance of low carb/ketogenic diet strongly emphasized and discussed with patient in addition to regular exercise.   - Check FBS and 2 hours after the biggest meal of your day (postprandial).  Keep log and bring in next OV for my review.   Also, if you ever feel poorly, please check your blood pressure and blood sugar, as one or the other could be the cause of your symptoms.  - Being a diabetic, you need yearly eye and foot exams. Make appt.for diabetic eye exam   - Continue current medication(s).     - Patient knows to let us know if she would rather visit endocrinology for follow-up on both her hypothyroidism and diabetes.  2. GERD - Ranitidine prescribed today to help manage patient's acid reflux.  - Continue omeprazole as prescribed.  - Advised patient to avoid trigger foods, especially such as caffeine and chocolate.  Have nothing to eat  within 3 hours of lying down at night.    3. Cholesterol - LDL at goal at 57 on 03/03/2017. - No changes made to treatment at this time. - Continue management as prescribed.  4. Blood Pressure - Patient will continue to keep an eye on her blood pressure (and blood sugars) at home.  - Ambulatory BP monitoring encouraged.  Patient will keep a blood pressure log for review next visit.  5. BMI Counseling Explained to patient what BMI refers to, and what it means medically.    Told patient to think about it as a "medical risk stratification measurement" and how increasing BMI is associated with increasing risk/ or worsening state of various diseases such as hypertension, hyperlipidemia, diabetes, premature OA, depression etc.  American Heart Association guidelines for healthy diet, basically Mediterranean diet, and exercise guidelines of 30 minutes 5 days per week or more discussed in detail.  Health counseling performed.  All questions answered.  6. Preventative Health Maintenance - Advised patient to continue working toward exercising to improve health.  Discussed the importance of exercise for physical, mental, and emotional well-being.  - Patient will begin with 5-10 minutes of activity daily. Recommended that the patient eventually strive for at least 150 minutes of cardio per week according to guidelines established by the [facility].   - Healthy dietary habits encouraged, including low-carb, and high amounts of lean protein in diet.   - Patient should also consume adequate amounts of water - half of body weight in oz of water per  day.   Education and routine counseling performed. Handouts provided.  7. Follow-Up  - Return for regularly scheduled follow-up, DM management and health maintenance.   Orders Placed This Encounter  Procedures  . Ambulatory referral to diabetic education    Meds ordered this encounter  Medications  . ranitidine (ZANTAC) 300 MG tablet    Sig: 1 tab twice  daily for 1 month, then 1- 1/2 tab twice daily for GERD    Dispense:  180 tablet    Refill:  1  . glipiZIDE (GLUCOTROL) 10 MG tablet    Sig: TAKE 1 TABLET BY MOUTH 2 TIMES DAILY BEFORE A MEAL.    Dispense:  180 tablet    Refill:  3    Return for Follow-up 3 months, added Zantac, nutrition consult.   The patient was counseled, risk factors were discussed, anticipatory guidance given.  Gross side effects, risk and benefits, and alternatives of medications discussed with patient.  Patient is aware that all medications have potential side effects and we are unable to predict every side effect or drug-drug interaction that may occur.  Expresses verbal understanding and consents to current therapy plan and treatment regimen.  Please see AVS handed out to patient at the end of our visit for further patient instructions/ counseling done pertaining to today's office visit.    Note: This document was prepared using Dragon voice recognition software and may include unintentional dictation errors.  This document serves as a record of services personally performed by [name], DO. It was created on her behalf by [name], a trained medical scribe. The creation of this record is based on the scribe's personal observations and the provider's statements to them.   I have reviewed the above medical documentation for accuracy and completeness and I concur.  [name] 12/11/17 9:57 PM     Subjective:    Chief Complaint  Patient presents with  . Follow-up    Amanda Douglas is a 72 y.o. female who presents to [facility] today for Diabetes Management.    Patient just saw endocrinology Dr. Loanne Drilling for her hypothyroid management.  Nauseous After Activity - GERD Patient has not been exercising recently.  She notes that she continues to get sick periodically.  "I get that feeling in my chest and then I throw up."  She has been evaluated by GI and  cardiology and notes "it's nothing to do with my heart."  States "they just tell me I have acid reflux."  Patient takes her medicine for acid reflux daily - omeprazole.  Notes "I have a lot of trouble with the acid reflux, and the hernia."  She has noticed that certain foods make her feel sick.  Patient also manages allergies with allegra.  Blood Pressure Checks this from time to time. Notes that the other day it was "120-something over 74."  Notes "it goes up and down."  DM HPI: -  She has not been working on diet and exercise for diabetes.  Notes that she tries to eat low carb bread, and tries to control her intake of things such as potato chips and cheese puffs.  States she really does try to control what she eats, but she doesn't lose any weight.  Pt is currently maintained on the following medications for diabetes:   see med list today Medication compliance - Continues on glipizide as prescribed.  Home glucose readings range: Checks periodically. Fasting blood sugars usually run in 130's. She  does not check post-prandial measurements.   Denies polyuria/polydipsia. Denies hypo/ hyperglycemia symptoms - She denies new onset of: chest pain, exercise intolerance, shortness of breath, dizziness, visual changes, headache, lower extremity swelling or claudication.   Last diabetic eye exam was  Lab Results  Component Value Date   HMDIABEYEEXA No Retinopathy 10/27/2016    Foot exam- UTD  Last A1C in the office was:  Lab Results  Component Value Date   HGBA1C 7.4 (A) 11/15/2017   HGBA1C 7.3 08/11/2017   HGBA1C 8.1 (H) 03/03/2017    Lab Results  Component Value Date   MICROALBUR 10 08/11/2017   LDLCALC 57 03/03/2017   CREATININE 0.97 08/11/2017     Last 3 blood pressure readings in our office are as follows: BP Readings from Last 3 Encounters:  12/05/17 140/78  11/15/17 140/82  10/14/17 136/64    BMI Readings from Last 3 Encounters:  12/05/17 28.25 kg/m    11/15/17 28.65 kg/m  10/14/17 28.84 kg/m     No problems updated.    Patient Care Team    Relationship Specialty Notifications Start End  [name], DO PCP - General Family Medicine  04/01/16   Margaretmary Bayley, MD Referring Physician Internal Medicine  04/01/16   Gastroenterology, [facility] Physician Gastroenterology  04/19/16    Comment: Patient had endoscopy and colonoscopy earlier in year or late 2016.       Patient Active Problem List   Diagnosis Date Noted  . Hypertension associated with diabetes ([place]) 03/03/2017    Priority: High  . Hyperlipidemia associated with type 2 diabetes mellitus ([place]) 04/19/2016    Priority: High  . Type 2 diabetes mellitus with complication ([place]) 60/73/7106    Priority: High  . Personal history of noncompliance with medical treatment and regimen 04/19/2016    Priority: High  . Hypertriglyceridemia 05/26/2016    Priority: Medium  . Adjustment disorder with mixed anxiety and depressed mood 04/19/2016    Priority: Medium  . Hypothyroidism 04/19/2016    Priority: Medium  . Overweight (BMI 25.0-29.9) 04/19/2016    Priority: Medium  . Gastroesophageal reflux disease 04/19/2016    Priority: Medium  . Environmental and seasonal allergies 03/03/2017    Priority: Low  . 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 Basal cell carcinoma (BCC)- chin in 2012 03/03/2017    Priority: Low  . Colonoscopy refused 03/03/2017    Priority: Low  . Hyperkalemia 05/26/2016    Priority: Low  . Vitamin D deficiency 04/19/2016    Priority: Low  . Exertional chest pain 03/15/2017  . Nausea w exertion- relieved with rest 03/15/2017  . Counseling on health promotion and disease prevention 05/26/2016     Past Medical History:  Diagnosis Date  . Depression   . Diabetes mellitus without complication ([place])   . Hyperlipidemia   . Thyroid disease      Past Surgical History:  Procedure Laterality Date  . ANKLE  FRACTURE SURGERY    . CESAREAN SECTION       Family History  Problem Relation Age of Onset  . Diabetes Mother   . Hypertension Mother   . Thyroid disease Mother   . Depression Mother   . COPD Sister   . Arthritis Sister   . Healthy Brother   . Healthy Daughter   . Diabetes Son   . Cancer Sister        squamous cell  . Hypertension Son      Social History  Substance and Sexual Activity  Drug Use No  ,  Social History   Substance and Sexual Activity  Alcohol Use No  ,  Social History   Tobacco Use  Smoking Status Former Smoker  . Packs/day: 1.00  . Years: 15.00  . Pack years: 15.00  . Types: Cigarettes  . Last attempt to quit: 06/14/1977  . Years since quitting: 40.5  Smokeless Tobacco Never Used  ,    Current Outpatient Medications on File Prior to Visit  Medication Sig Dispense Refill  . Cholecalciferol (VITAMIN D3) 5000 units CAPS Take 1 capsule by mouth daily.    . citalopram (CELEXA) 40 MG tablet Take 1 tablet (40 mg total) by mouth daily. 90 tablet 1  . fexofenadine (ALLEGRA) 180 MG tablet Take 1 tablet (180 mg total) by mouth daily. 90 tablet 3  . fluticasone (FLONASE) 50 MCG/ACT nasal spray Do 1 spray each nostril twice daily after your sinus rinses 16 g 2  . levothyroxine (SYNTHROID, LEVOTHROID) 75 MCG tablet Take 1 tablet (75 mcg total) by mouth daily before breakfast. 30 tablet 5  . losartan (COZAAR) 25 MG tablet Take 1 tablet (25 mg total) by mouth daily. 90 tablet 4  . Lysine 1000 MG TABS Take 1 tablet by mouth daily.    Marland Kitchen omeprazole (PRILOSEC) 20 MG capsule TAKE 1 CAPSULE BY MOUTH 2 TIMES A DAY BEFORE A MEAL 60 capsule 0  . rosuvastatin (CRESTOR) 10 MG tablet TAKE 1 TABLET BY MOUTH DAILY. 90 tablet 1   No current facility-administered medications on file prior to visit.      Allergies  Allergen Reactions  . Codeine Nausea And Vomiting  . Metformin And Related Diarrhea     Review of Systems:   General:  Denies fever,  chills Optho/Auditory:   Denies visual changes, blurred vision Respiratory:   Denies SOB, cough, wheeze, DIB  Cardiovascular:   Denies chest pain, palpitations, painful respirations Gastrointestinal:   Denies nausea, vomiting, diarrhea.  Endocrine:     Denies new hot or cold intolerance Musculoskeletal:  Denies joint swelling, gait issues, or new unexplained myalgias/ arthralgias Skin:  Denies rash, suspicious lesions  Neurological:    Denies dizziness, unexplained weakness, numbness  Psychiatric/Behavioral:   Denies mood changes    Objective:     Blood pressure 140/78, pulse 73, height 5\' 6"  (1.676 m), weight 175 lb (79.4 kg), SpO2 98 %.  Body mass index is 28.25 kg/m.  General: Well Developed, well nourished, and in no acute distress.  HEENT: Normocephalic, atraumatic, pupils equal round reactive to light, neck supple, No carotid bruits, no JVD Skin: Warm and dry, cap RF less 2 sec Cardiac: Regular rate and rhythm, S1, S2 WNL's, no murmurs rubs or gallops Respiratory: ECTA B/L, Not using accessory muscles, speaking in full sentences. NeuroM-Sk: Ambulates w/o assistance, moves ext * 4 w/o difficulty, sensation grossly intact.  Ext: scant edema b/l lower ext Psych: No HI/SI, judgement and insight good, Euthymic mood. Full Affect.

## 2017-12-05 NOTE — Patient Instructions (Signed)
Keep an eye on your blood pressure and blood sugars at home.  Please keep a log and write numbers down so that we can review at next office visit.  GOALS :  The goals are for the Hgb A1C to be less than 7.0 & blood pressure to be less than 140/90.    It is recommended that all diabetics are educated on and follow a healthy diabetic diet, exercise for 30 minutes 3-4 times per week (walking, biking, swimming, or machine), monitor blood glucose readings and bring that record with you to be reviewed at your next office visit.     You should be checking fasting blood sugars- especially after you eat poorly or eat really healthy, and also check 2 hour postprandial blood sugars after largest meal of the day.    Write these down and bring in your log at each office visit.    You will need to be seen every 3 months by the provider managing your Diabetes unless told otherwise by that provider.   You will need yearly eye exams from an eye specialist and foot exams to check the nerves of your feet.  Also, your urine should be checked yearly as well to make sure excess protein is not present.   If you are checking your blood pressure at home, please record it and bring it to your next office visit.    Follow the Dietary Approaches to Stop Hypertension (DASH) diet (3 servings of fruit and vegetables daily, whole grains, low sodium, low-fat proteins).  See below.    Lastly, when it comes to your cholesterol, the goal is to have the HDL (good cholesterol) >40, and the LDL (bad cholesterol) <100.   It is recommended that you follow a heart healthy, low saturated and trans-fat diet and exercise for 30 minutes at least 5 times a week.     (( Check out the DASH diet = 1.5 Gram Low Sodium Diet   A 1.5 gram sodium diet restricts the amount of sodium in the diet to no more than 1.5 g or 1500 mg daily.  The American Heart Association recommends Americans over the age of 43 to consume no more than 1500 mg of sodium  each day to reduce the risk of developing high blood pressure.  Research also shows that limiting sodium may reduce heart attack and stroke risk.  Many foods contain sodium for flavor and sometimes as a preservative.  When the amount of sodium in a diet needs to be low, it is important to know what to look for when choosing foods and drinks.  The following includes some information and guidelines to help make it easier for you to adapt to a low sodium diet.    QUICK TIPS  Do not add salt to food.  Avoid convenience items and fast food.  Choose unsalted snack foods.  Buy lower sodium products, often labeled as "lower sodium" or "no salt added."  Check food labels to learn how much sodium is in 1 serving.  When eating at a restaurant, ask that your food be prepared with less salt or none, if possible.    READING FOOD LABELS FOR SODIUM INFORMATION  The nutrition facts label is a good place to find how much sodium is in foods. Look for products with no more than 400 mg of sodium per serving.  Remember that 1.5 g = 1500 mg.  The food label may also list foods as:  Sodium-free: Less than 5 mg  in a serving.  Very low sodium: 35 mg or less in a serving.  Low-sodium: 140 mg or less in a serving.  Light in sodium: 50% less sodium in a serving. For example, if a food that usually has 300 mg of sodium is changed to become light in sodium, it will have 150 mg of sodium.  Reduced sodium: 25% less sodium in a serving. For example, if a food that usually has 400 mg of sodium is changed to reduced sodium, it will have 300 mg of sodium.    CHOOSING FOODS  Grains  Avoid: Salted crackers and snack items. Some cereals, including instant hot cereals. Bread stuffing and biscuit mixes. Seasoned rice or pasta mixes.  Choose: Unsalted snack items. Low-sodium cereals, oats, puffed wheat and rice, shredded wheat. English muffins and bread. Pasta.  Meats  Avoid: Salted, canned, smoked, spiced, pickled meats,  including fish and poultry. Bacon, ham, sausage, cold cuts, hot dogs, anchovies.  Choose: Low-sodium canned tuna and salmon. Fresh or frozen meat, poultry, and fish.  Dairy  Avoid: Processed cheese and spreads. Cottage cheese. Buttermilk and condensed milk. Regular cheese.  Choose: Milk. Low-sodium cottage cheese. Yogurt. Sour cream. Low-sodium cheese.  Fruits and Vegetables  Avoid: Regular canned vegetables. Regular canned tomato sauce and paste. Frozen vegetables in sauces. Olives. Angie Fava. Relishes. Sauerkraut.  Choose: Low-sodium canned vegetables. Low-sodium tomato sauce and paste. Frozen or fresh vegetables. Fresh and frozen fruit.  Condiments  Avoid: Canned and packaged gravies. Worcestershire sauce. Tartar sauce. Barbecue sauce. Soy sauce. Steak sauce. Ketchup. Onion, garlic, and table salt. Meat flavorings and tenderizers.  Choose: Fresh and dried herbs and spices. Low-sodium varieties of mustard and ketchup. Lemon juice. Tabasco sauce. Horseradish.    SAMPLE 1.5 GRAM SODIUM MEAL PLAN:   Breakfast / Sodium (mg)  1 cup low-fat milk / 143 mg  1 whole-wheat English muffin / 240 mg  1 tbs heart-healthy margarine / 153 mg  1 hard-boiled egg / 139 mg  1 small orange / 0 mg  Lunch / Sodium (mg)  1 cup raw carrots / 76 mg  2 tbs no salt added peanut butter / 5 mg  2 slices whole-wheat bread / 270 mg  1 tbs jelly / 6 mg   cup red grapes / 2 mg  Dinner / Sodium (mg)  1 cup whole-wheat pasta / 2 mg  1 cup low-sodium tomato sauce / 73 mg  3 oz lean ground beef / 57 mg  1 small side salad (1 cup raw spinach leaves,  cup cucumber,  cup yellow bell pepper) with 1 tsp olive oil and 1 tsp red wine vinegar / 25 mg  Snack / Sodium (mg)  1 container low-fat vanilla yogurt / 107 mg  3 graham cracker squares / 127 mg  Nutrient Analysis  Calories: 1745  Protein: 75 g  Carbohydrate: 237 g  Fat: 57 g  Sodium: 1425 mg  Document Released: 05/31/2005 Document Revised: 02/10/2011 Document  Reviewed: 09/01/2009  [facility] Patient Information 2012 [place], [place].))

## 2017-12-28 ENCOUNTER — Ambulatory Visit: Payer: Medicare PPO | Admitting: Registered"

## 2018-02-15 ENCOUNTER — Other Ambulatory Visit: Payer: Self-pay | Admitting: Family Medicine

## 2018-02-17 ENCOUNTER — Encounter: Payer: Self-pay | Admitting: Endocrinology

## 2018-02-17 ENCOUNTER — Ambulatory Visit: Payer: Medicare PPO | Admitting: Endocrinology

## 2018-02-17 VITALS — BP 142/90 | HR 79 | Ht 66.0 in | Wt 178.6 lb

## 2018-02-17 DIAGNOSIS — E118 Type 2 diabetes mellitus with unspecified complications: Secondary | ICD-10-CM

## 2018-02-17 DIAGNOSIS — E039 Hypothyroidism, unspecified: Secondary | ICD-10-CM

## 2018-02-17 LAB — T4, FREE: FREE T4: 0.89 ng/dL (ref 0.60–1.60)

## 2018-02-17 LAB — TSH: TSH: 2.99 u[IU]/mL (ref 0.35–4.50)

## 2018-02-17 NOTE — Progress Notes (Signed)
Subjective:    Patient ID: Amanda Douglas, female    DOB: [birth year], 72 y.o.   MRN: [id number]  HPI Pt returns for f/u of hypothyroidism (dx'ed 2010; she has been on prescribed thyroid hormone therapy since then; she has variable TFT--prob due to variable autoimmunity). Since on 75 mcg/d, pt states she feels well in general, except for fatigue.   Past Medical History:  Diagnosis Date  . Depression   . Diabetes mellitus without complication ([place])   . Hyperlipidemia   . Thyroid disease     Past Surgical History:  Procedure Laterality Date  . ANKLE FRACTURE SURGERY    . CESAREAN SECTION      Social History   Socioeconomic History  . Marital status: Married    Spouse name: Not on file  . Number of children: Not on file  . Years of education: Not on file  . Highest education level: Not on file  Occupational History  . Not on file  Social Needs  . Financial resource strain: Not on file  . Food insecurity:    Worry: Not on file    Inability: Not on file  . Transportation needs:    Medical: Not on file    Non-medical: Not on file  Tobacco Use  . Smoking status: Former Smoker    Packs/day: 1.00    Years: 15.00    Pack years: 15.00    Types: Cigarettes    Last attempt to quit: 06/14/1977    Years since quitting: 40.7  . Smokeless tobacco: Never Used  Substance and Sexual Activity  . Alcohol use: No  . Drug use: No  . Sexual activity: Never  Lifestyle  . Physical activity:    Days per week: Not on file    Minutes per session: Not on file  . Stress: Not on file  Relationships  . Social connections:    Talks on phone: Not on file    Gets together: Not on file    Attends religious service: Not on file    Active member of club or organization: Not on file    Attends meetings of clubs or organizations: Not on file    Relationship status: Not on file  . Intimate partner violence:    Fear of current or ex partner: Not on file    Emotionally abused: Not on file   Physically abused: Not on file    Forced sexual activity: Not on file  Other Topics Concern  . Not on file  Social History Narrative  . Not on file    Current Outpatient Medications on File Prior to Visit  Medication Sig Dispense Refill  . Cholecalciferol (VITAMIN D3) 5000 units CAPS Take 1 capsule by mouth daily.    . citalopram (CELEXA) 40 MG tablet TAKE 1 TABLET BY MOUTH DAILY. 90 tablet 1  . fexofenadine (ALLEGRA) 180 MG tablet Take 1 tablet (180 mg total) by mouth daily. 90 tablet 3  . fluticasone (FLONASE) 50 MCG/ACT nasal spray Do 1 spray each nostril twice daily after your sinus rinses 16 g 2  . glipiZIDE (GLUCOTROL) 10 MG tablet TAKE 1 TABLET BY MOUTH 2 TIMES DAILY BEFORE A MEAL. 180 tablet 3  . levothyroxine (SYNTHROID, LEVOTHROID) 75 MCG tablet Take 1 tablet (75 mcg total) by mouth daily before breakfast. 30 tablet 5  . Lysine 1000 MG TABS Take 1 tablet by mouth daily.    Marland Kitchen omeprazole (PRILOSEC) 20 MG capsule TAKE 1 CAPSULE BY MOUTH 2 TIMES  A DAY BEFORE A MEAL 60 capsule 0  . ranitidine (ZANTAC) 300 MG tablet 1 tab twice daily for 1 month, then 1- 1/2 tab twice daily for GERD 180 tablet 1  . rosuvastatin (CRESTOR) 10 MG tablet TAKE 1 TABLET BY MOUTH DAILY. 90 tablet 1  . losartan (COZAAR) 25 MG tablet Take 1 tablet (25 mg total) by mouth daily. (Patient not taking: Reported on 02/17/2018) 90 tablet 4   No current facility-administered medications on file prior to visit.     Allergies  Allergen Reactions  . Codeine Nausea And Vomiting  . Metformin And Related Diarrhea    Family History  Problem Relation Age of Onset  . Diabetes Mother   . Hypertension Mother   . Thyroid disease Mother   . Depression Mother   . COPD Sister   . Arthritis Sister   . Healthy Brother   . Healthy Daughter   . Diabetes Son   . Cancer Sister        squamous cell  . Hypertension Son     BP (!) 142/90 (BP Location: Left Arm, Patient Position: Sitting, Cuff Size: Normal)   Pulse 79   Ht  5\' 6"  (1.676 m)   Wt 178 lb 9.6 oz (81 kg)   SpO2 (!) 79%   BMI 28.83 kg/m    Review of Systems Denies leg swelling.      Objective:   Physical Exam VITAL SIGNS:  See vs page GENERAL: no distress NECK: There is no palpable thyroid enlargement.  No thyroid nodule is palpable.  No palpable lymphadenopathy at the anterior neck.  Lab Results  Component Value Date   TSH 2.99 02/17/2018       Assessment & Plan:  Hypothyroidism: well-replaced Autoimmune thyroiditis, with fluctuating thyroid function.  We'll follow this.  Patient Instructions  Thyroid blood tests are requested for you today.  We'll let you know about the results. The differences are probably due to your fluctuating immunity.  With time though, your need for medication will stabilize.  Please see Dr Raliegh Scarlet, to follow up your diabetes.   We'll try to make just small thyroid medication adjustments whenever we can.

## 2018-02-17 NOTE — Patient Instructions (Signed)
Thyroid blood tests are requested for you today.  We'll let you know about the results. The differences are probably due to your fluctuating immunity.  With time though, your need for medication will stabilize.  Please see Dr Raliegh Scarlet, to follow up your diabetes.   We'll try to make just small thyroid medication adjustments whenever we can.

## 2018-03-08 ENCOUNTER — Ambulatory Visit: Payer: Medicare PPO | Admitting: Family Medicine

## 2018-04-22 IMAGING — NM NM MISC PROCEDURE
6 series · 36 of 36 positions shown · non-contrast
Comparison: none

[Series 1: wbr_s-proj_st stress-gsp · 6.40mm/px · 6 of 512 frames shown]
[frame 43/512]
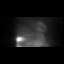
[frame 128/512]
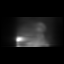
[frame 214/512]
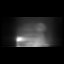
[frame 299/512]
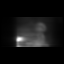
[frame 384/512]
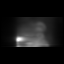
[frame 470/512]
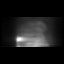

[Series 1: rest · 6.40mm/px · 6 of 64 frames shown]
[frame 6/64]
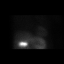
[frame 16/64]
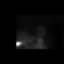
[frame 27/64]
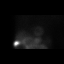
[frame 38/64]
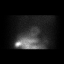
[frame 48/64]
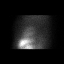
[frame 59/64]
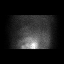

[Series 1: stress-gsp · 6.40mm/px · 6 of 512 frames shown]
[frame 43/512]
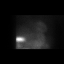
[frame 128/512]
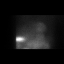
[frame 214/512]
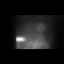
[frame 299/512]
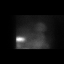
[frame 384/512]
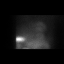
[frame 470/512]
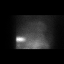

[Series 1: wbr_s-proj_st stress-sum-em · 6.40mm/px · 6 of 64 frames shown]
[frame 6/64]
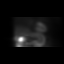
[frame 16/64]
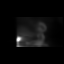
[frame 27/64]
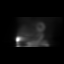
[frame 38/64]
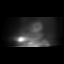
[frame 48/64]
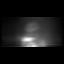
[frame 59/64]
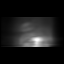

[Series 1: stress-sum-em · 6.40mm/px · 6 of 64 frames shown]
[frame 6/64]
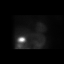
[frame 16/64]
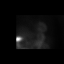
[frame 27/64]
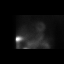
[frame 38/64]
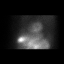
[frame 48/64]
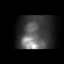
[frame 59/64]
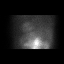

[Series 1: wbr_r-proj_st rest · 6.40mm/px · 6 of 64 frames shown]
[frame 6/64]
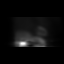
[frame 16/64]
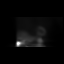
[frame 27/64]
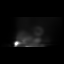
[frame 38/64]
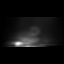
[frame 48/64]
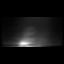
[frame 59/64]
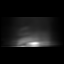

[36 of 36 positions shown; findings below may reference images not displayed]

Canned report from images found in remote index.

Refer to host system for actual result text.

## 2018-05-20 ENCOUNTER — Other Ambulatory Visit: Payer: Self-pay | Admitting: Endocrinology

## 2018-08-21 ENCOUNTER — Ambulatory Visit: Payer: Medicare PPO | Admitting: Endocrinology

## 2018-08-21 ENCOUNTER — Encounter: Payer: Self-pay | Admitting: Endocrinology

## 2018-08-21 ENCOUNTER — Other Ambulatory Visit: Payer: Self-pay

## 2018-08-21 VITALS — BP 142/72 | HR 89 | Ht 66.0 in | Wt 177.6 lb

## 2018-08-21 DIAGNOSIS — E118 Type 2 diabetes mellitus with unspecified complications: Secondary | ICD-10-CM | POA: Diagnosis not present

## 2018-08-21 LAB — POCT GLYCOSYLATED HEMOGLOBIN (HGB A1C): Hemoglobin A1C: 7.7 % — AB (ref 4.0–5.6)

## 2018-08-21 LAB — TSH: TSH: 2.93 u[IU]/mL (ref 0.35–4.50)

## 2018-08-21 LAB — T4, FREE: Free T4: 1.03 ng/dL (ref 0.60–1.60)

## 2018-08-21 MED ORDER — SITAGLIPTIN PHOSPHATE 100 MG PO TABS: 100.0000 mg | ORAL_TABLET | Freq: Every day | ORAL | 11 refills | Status: DC

## 2018-08-21 NOTE — Patient Instructions (Addendum)
Your blood pressure is high today.  Please see your primary care provider soon, to have it rechecked.  Blood tests are requested for you today.  We'll let you know about the results.  I have sent a prescription to your pharmacy, to change glipizide to Januvia.  If your insurance does not like this, we can change to a similar pill.  Please call or message [place] next week, to tell us how the blood sugar is doing.   Please come back for a follow-up appointment in 2 months.

## 2018-08-21 NOTE — Progress Notes (Signed)
Subjective:    Patient ID: Amanda Douglas, female    DOB: 09/03/1945, 73 y.o.   MRN: [id number]  HPI Pt returns for f/u of hypothyroidism (dx'ed 2010; she has been on prescribed thyroid hormone therapy since then; she has variable TFT--prob due to variable autoimmunity). Since on 75 mcg/d, pt states she feels well in general, except for fatigue. Pt also returns for f/u of diabetes mellitus: DM type: 2 Dx'ed: 7353 Complications: none Therapy: glipizide GDM: never DKA: never Severe hypoglycemia: never Pancreatitis: never Pancreatic imaging: normal on 2008 CT Other: she has never taken insulin; she did not tolerate metformin-XR (diarrrhea) Interval history: pt states she feels well in general.   Past Medical History:  Diagnosis Date  . Depression   . Diabetes mellitus without complication ([place])   . Hyperlipidemia   . Thyroid disease     Past Surgical History:  Procedure Laterality Date  . ANKLE FRACTURE SURGERY    . CESAREAN SECTION      Social History   Socioeconomic History  . Marital status: Married    Spouse name: Not on file  . Number of children: Not on file  . Years of education: Not on file  . Highest education level: Not on file  Occupational History  . Not on file  Social Needs  . Financial resource strain: Not on file  . Food insecurity:    Worry: Not on file    Inability: Not on file  . Transportation needs:    Medical: Not on file    Non-medical: Not on file  Tobacco Use  . Smoking status: Former Smoker    Packs/day: 1.00    Years: 15.00    Pack years: 15.00    Types: Cigarettes    Last attempt to quit: 06/14/1977    Years since quitting: 41.2  . Smokeless tobacco: Never Used  Substance and Sexual Activity  . Alcohol use: No  . Drug use: No  . Sexual activity: Never  Lifestyle  . Physical activity:    Days per week: Not on file    Minutes per session: Not on file  . Stress: Not on file  Relationships  . Social connections:    Talks on  phone: Not on file    Gets together: Not on file    Attends religious service: Not on file    Active member of club or organization: Not on file    Attends meetings of clubs or organizations: Not on file    Relationship status: Not on file  . Intimate partner violence:    Fear of current or ex partner: Not on file    Emotionally abused: Not on file    Physically abused: Not on file    Forced sexual activity: Not on file  Other Topics Concern  . Not on file  Social History Narrative  . Not on file    Current Outpatient Medications on File Prior to Visit  Medication Sig Dispense Refill  . Cholecalciferol (VITAMIN D3) 5000 units CAPS Take 1 capsule by mouth daily.    . citalopram (CELEXA) 40 MG tablet TAKE 1 TABLET BY MOUTH DAILY. 90 tablet 1  . fexofenadine (ALLEGRA) 180 MG tablet Take 1 tablet (180 mg total) by mouth daily. 90 tablet 3  . fluticasone (FLONASE) 50 MCG/ACT nasal spray Do 1 spray each nostril twice daily after your sinus rinses 16 g 2  . levothyroxine (SYNTHROID, LEVOTHROID) 75 MCG tablet TAKE 1 TABLET BY MOUTH DAILY BEFORE BREAKFAST.  30 tablet 5  . losartan (COZAAR) 25 MG tablet Take 1 tablet (25 mg total) by mouth daily. 90 tablet 4  . Lysine 1000 MG TABS Take 1 tablet by mouth daily.    Marland Kitchen omeprazole (PRILOSEC) 20 MG capsule TAKE 1 CAPSULE BY MOUTH 2 TIMES A DAY BEFORE A MEAL 60 capsule 0  . ranitidine (ZANTAC) 300 MG tablet 1 tab twice daily for 1 month, then 1- 1/2 tab twice daily for GERD 180 tablet 1  . rosuvastatin (CRESTOR) 10 MG tablet TAKE 1 TABLET BY MOUTH DAILY. 90 tablet 1   No current facility-administered medications on file prior to visit.     Allergies  Allergen Reactions  . Codeine Nausea And Vomiting  . Metformin And Related Diarrhea    Family History  Problem Relation Age of Onset  . Diabetes Mother   . Hypertension Mother   . Thyroid disease Mother   . Depression Mother   . COPD Sister   . Arthritis Sister   . Healthy Brother   .  Healthy Daughter   . Diabetes Son   . Cancer Sister        squamous cell  . Hypertension Son     BP (!) 142/72 (BP Location: Right Arm, Patient Position: Sitting, Cuff Size: Normal)   Pulse 89   Ht 5\' 6"  (1.676 m)   Wt 177 lb 9.6 oz (80.6 kg)   SpO2 96%   BMI 28.67 kg/m   Review of Systems She denies hypoglycemia    Objective:   Physical Exam VITAL SIGNS:  See vs page GENERAL: no distress Pulses: dorsalis pedis intact bilat.   MSK: no deformity of the feet CV: no leg edema Skin:  no ulcer on the feet.  normal color and temp on the feet. Neuro: sensation is intact to touch on the feet Ext: There is bilateral onychomycosis of the toenails  Lab Results  Component Value Date   HGBA1C 7.7 (A) 08/21/2018      Lab Results  Component Value Date   CREATININE 0.97 08/11/2017   BUN 22 08/11/2017   NA 138 08/11/2017   K 5.2 08/11/2017   CL 104 08/11/2017   CO2 19 (L) 08/11/2017   Lab Results  Component Value Date   TSH 2.93 08/21/2018       Assessment & Plan:  HTN: is noted today Hypothyroidism: is noted today Type 2 DM, new to me: she needs increased rx, if it can be done with a medication regimen that avoids or minimizes hypoglycemia.  Patient Instructions  Your blood pressure is high today.  Please see your primary care provider soon, to have it rechecked.  Blood tests are requested for you today.  We'll let you know about the results.  I have sent a prescription to your pharmacy, to change glipizide to Januvia.  If your insurance does not like this, we can change to a similar pill.  Please call or message [place] next week, to tell us how the blood sugar is doing.   Please come back for a follow-up appointment in 2 months.

## 2018-08-29 ENCOUNTER — Telehealth: Payer: Self-pay | Admitting: Endocrinology

## 2018-08-29 NOTE — Telephone Encounter (Signed)
Ok, Please continue the same medication.  I'll see you next time.  I hope you feel well.

## 2018-08-29 NOTE — Telephone Encounter (Signed)
1. Are you taking any type of steroids? No  2. Are you sick or becoming sick? No  3. Is this the first elevated blood sugar reading? N/A  4. Have you tried to correct it with insulin? N/A  5. Have you been taking/taken today all of the prescribed medications? Yes   Date Time Reading Notes       3/17 1130a 144 Fasting  3/16 10a 144 Fasting  3/15 11a 146 Fasting  3/14 930a 114 Fasting  3/13 130p 125 2 hours after eating  3/12 930a 145 Fasting              Please advise

## 2018-08-29 NOTE — Telephone Encounter (Signed)
Called pt and made her aware of Dr. Cordelia Pen addvice. Verbalized acceptance and understanding.

## 2018-08-29 NOTE — Telephone Encounter (Signed)
Patient called re: patient's blood sugars. Patient unsure of what exactly Dr. Loanne Drilling wants from her re: timeframe of keeping track of blood sugars (lowest was 114 fasting). Please call at ph# [phone] to advise.

## 2018-09-04 ENCOUNTER — Other Ambulatory Visit: Payer: Self-pay | Admitting: Family Medicine

## 2018-10-20 ENCOUNTER — Other Ambulatory Visit: Payer: Self-pay

## 2018-10-20 ENCOUNTER — Ambulatory Visit: Payer: Medicare PPO | Admitting: Endocrinology

## 2018-10-20 ENCOUNTER — Encounter: Payer: Self-pay | Admitting: Endocrinology

## 2018-10-20 VITALS — BP 160/70 | HR 88 | Ht 66.0 in | Wt 177.2 lb

## 2018-10-20 DIAGNOSIS — E119 Type 2 diabetes mellitus without complications: Secondary | ICD-10-CM

## 2018-10-20 DIAGNOSIS — I10 Essential (primary) hypertension: Secondary | ICD-10-CM | POA: Diagnosis not present

## 2018-10-20 DIAGNOSIS — E118 Type 2 diabetes mellitus with unspecified complications: Secondary | ICD-10-CM

## 2018-10-20 LAB — POCT GLYCOSYLATED HEMOGLOBIN (HGB A1C): Hemoglobin A1C: 7.9 % — AB (ref 4.0–5.6)

## 2018-10-20 MED ORDER — BROMOCRIPTINE MESYLATE 2.5 MG PO TABS: ORAL_TABLET | ORAL | 11 refills | Status: DC

## 2018-10-20 NOTE — Patient Instructions (Addendum)
Your blood pressure is high today.  Please see your primary care provider soon, to have it rechecked.  I have sent a prescription to your pharmacy, to add "bromocriptine."  Please come back for a follow-up appointment in 2-3 months.

## 2018-10-20 NOTE — Progress Notes (Signed)
Subjective:    Patient ID: Amanda Douglas, female    DOB: [birth year], 73 y.o.   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 Since on 75 mcg/d, pt states she feels well in general, except for fatigue. Pt also returns for f/u of diabetes mellitus:  DM type: 2 Dx'ed: 3664 Complications: none Therapy: [place] GDM: never DKA: never Severe hypoglycemia: never.   Pancreatitis: never Pancreatic imaging: normal on 2008 CT Other: she has never taken insulin; she did not tolerate metformin-XR (diarrrhea).   Interval history: pt states she feels well in general.  She says cbg's are in the mid-100's.  She takes [place] as rx'ed.   Past Medical History:  Diagnosis Date  . Depression   . Diabetes mellitus without complication ([place])   . Hyperlipidemia   . Thyroid disease     Past Surgical History:  Procedure Laterality Date  . ANKLE FRACTURE SURGERY    . CESAREAN SECTION      Social History   Socioeconomic History  . Marital status: Married    Spouse name: Not on file  . Number of children: Not on file  . Years of education: Not on file  . Highest education level: Not on file  Occupational History  . Not on file  Social Needs  . Financial resource strain: Not on file  . Food insecurity:    Worry: Not on file    Inability: Not on file  . Transportation needs:    Medical: Not on file    Non-medical: Not on file  Tobacco Use  . Smoking status: Former Smoker    Packs/day: 1.00    Years: 15.00    Pack years: 15.00    Types: Cigarettes    Last attempt to quit: 06/14/1977    Years since quitting: 41.3  . Smokeless tobacco: Never Used  Substance and Sexual Activity  . Alcohol use: No  . Drug use: No  . Sexual activity: Never  Lifestyle  . Physical activity:    Days per week: Not on file    Minutes per session: Not on file  .  Stress: Not on file  Relationships  . Social connections:    Talks on phone: Not on file    Gets together: Not on file    Attends religious service: Not on file    Active member of club or organization: Not on file    Attends meetings of clubs or organizations: Not on file    Relationship status: Not on file  . Intimate partner violence:    Fear of current or ex partner: Not on file    Emotionally abused: Not on file    Physically abused: Not on file    Forced sexual activity: Not on file  Other Topics Concern  . Not on file  Social History Narrative  . Not on file    Current Outpatient Medications on File Prior to Visit  Medication Sig Dispense Refill  . Cholecalciferol (VITAMIN D3) 5000 units CAPS Take 1 capsule by mouth daily.    . citalopram (CELEXA) 40 MG tablet TAKE 1 TABLET BY MOUTH DAILY. 90 tablet 0  . fexofenadine (ALLEGRA) 180 MG tablet Take 1 tablet (180 mg total) by mouth daily. 90 tablet 3  . fluticasone (FLONASE) 50 MCG/ACT nasal spray Do 1 spray each nostril twice daily after your sinus  rinses 16 g 2  . levothyroxine (SYNTHROID, LEVOTHROID) 75 MCG tablet TAKE 1 TABLET BY MOUTH DAILY BEFORE BREAKFAST. 30 tablet 5  . losartan (COZAAR) 25 MG tablet Take 1 tablet (25 mg total) by mouth daily. 90 tablet 4  . Lysine 1000 MG TABS Take 1 tablet by mouth daily.    Marland Kitchen omeprazole (PRILOSEC) 20 MG capsule TAKE 1 CAPSULE BY MOUTH 2 TIMES A DAY BEFORE A MEAL 60 capsule 0  . ranitidine (ZANTAC) 300 MG tablet 1 tab twice daily for 1 month, then 1- 1/2 tab twice daily for GERD 180 tablet 1  . rosuvastatin (CRESTOR) 10 MG tablet TAKE 1 TABLET BY MOUTH DAILY. 90 tablet 1  . sitaGLIPtin (JANUVIA) 100 MG tablet Take 1 tablet (100 mg total) by mouth daily. 30 tablet 11   No current facility-administered medications on file prior to visit.     Allergies  Allergen Reactions  . Codeine Nausea And Vomiting  . Metformin And Related Diarrhea    Family History  Problem Relation Age of  Onset  . Diabetes Mother   . Hypertension Mother   . Thyroid disease Mother   . Depression Mother   . COPD Sister   . Arthritis Sister   . Healthy Brother   . Healthy Daughter   . Diabetes Son   . Cancer Sister        squamous cell  . Hypertension Son     BP (!) 160/70 (BP Location: Left Arm, Patient Position: Sitting, Cuff Size: Normal)   Pulse 88   Ht 5\' 6"  (1.676 m)   Wt 177 lb 3.2 oz (80.4 kg)   SpO2 96%   BMI 28.60 kg/m    Review of Systems She denies hypoglycemia    Objective:   Physical Exam VITAL SIGNS:  See vs page. GENERAL: no distress Pulses: dorsalis pedis intact bilat.   MSK: no deformity of the feet CV: no leg edema Skin:  no ulcer on the feet.  normal color and temp on the feet.  Neuro: sensation is intact to touch on the feet.   Lab Results  Component Value Date   HGBA1C 7.9 (A) 10/20/2018   Lab Results  Component Value Date   CREATININE 0.97 08/11/2017   BUN 22 08/11/2017   NA 138 08/11/2017   K 5.2 08/11/2017   CL 104 08/11/2017   CO2 19 (L) 08/11/2017      Assessment & Plan:  HTN: is noted today.  Type 2 DM: worse.   Patient Instructions  Your blood pressure is high today.  Please see your primary care provider soon, to have it rechecked.  I have sent a prescription to your pharmacy, to add "bromocriptine."  Please come back for a follow-up appointment in 2-3 months.

## 2018-10-23 NOTE — Progress Notes (Signed)
I sent a message to Dorothea Ogle and Baker Janus to make patient an appointment in the very near future for elevated blood pressure at her recent endocrinology office visit.  Also, it is been almost a year since patient was last seen and she supposed to follow-up much sooner than that.  I am sending this to you so you could please call her and remind her that she needs to be checking her blood pressure at home on a regular basis and make sure she has those numbers written down so we could discuss at her future appointment thnx

## 2018-10-31 ENCOUNTER — Other Ambulatory Visit: Payer: Self-pay

## 2018-10-31 ENCOUNTER — Ambulatory Visit (INDEPENDENT_AMBULATORY_CARE_PROVIDER_SITE_OTHER): Payer: Medicare PPO | Admitting: Family Medicine

## 2018-10-31 ENCOUNTER — Encounter: Payer: Self-pay | Admitting: Family Medicine

## 2018-10-31 VITALS — BP 137/79 | HR 88 | Ht 66.0 in | Wt 177.0 lb

## 2018-10-31 DIAGNOSIS — Z9119 Patient's noncompliance with other medical treatment and regimen: Secondary | ICD-10-CM

## 2018-10-31 DIAGNOSIS — F4323 Adjustment disorder with mixed anxiety and depressed mood: Secondary | ICD-10-CM

## 2018-10-31 DIAGNOSIS — E785 Hyperlipidemia, unspecified: Secondary | ICD-10-CM | POA: Diagnosis not present

## 2018-10-31 DIAGNOSIS — K219 Gastro-esophageal reflux disease without esophagitis: Secondary | ICD-10-CM

## 2018-10-31 DIAGNOSIS — Z91199 Patient's noncompliance with other medical treatment and regimen due to unspecified reason: Secondary | ICD-10-CM

## 2018-10-31 DIAGNOSIS — E1169 Type 2 diabetes mellitus with other specified complication: Secondary | ICD-10-CM | POA: Diagnosis not present

## 2018-10-31 DIAGNOSIS — E118 Type 2 diabetes mellitus with unspecified complications: Secondary | ICD-10-CM

## 2018-10-31 DIAGNOSIS — E559 Vitamin D deficiency, unspecified: Secondary | ICD-10-CM

## 2018-10-31 DIAGNOSIS — Z7189 Other specified counseling: Secondary | ICD-10-CM

## 2018-10-31 NOTE — Progress Notes (Signed)
Telehealth office visit note for Amanda Douglas, D.O- at [facility]   I connected with current patient today and verified that I am speaking with the correct person using two identifiers.   . Location of the patient: Home . Location of the provider: Office Only the patient (+/- their family members at pt's discretion) and myself were participating in the encounter    - This visit type was conducted due to national recommendations for restrictions regarding the COVID-19 Pandemic (e.g. social distancing) in an effort to limit this patient's exposure and mitigate transmission in our community.  This format is felt to be most appropriate for this patient at this time.   - The patient did not have access to video technology or had technical difficulties with video requiring transitioning to audio format only. - No physical exam could be performed with this format, beyond that communicated to [place] by the patient/ family members as noted.   - Additionally my office staff/ schedulers discussed with the patient that there may be a monetary charge related to this service, depending on their medical insurance.   The patient expressed understanding, and agreed to proceed.       History of Present Illness:  Last OV-  11/2017 with me- she was lost to f/up.     Last OV with Endo- Bp was 160/70,   Mood:  Taking celexa daily, well controlled  Gerd:  Taking esomeprazole OTC daily on her own- once daily.  Sx well controlled on once daily.  Seasonal all:   Well controlled on allegra and flonase.   BP:  Checks BP at home twice day- for the past week .  At home 136/70; 110/67, 123/66.  IN the AM's- runs a little higher- like today.  No Sx at all.   Vit D:  otc 400 iu otc daily  CHOL HPI:  -  She  is currently managed with:nothing- stopped on her own  Txmnt compliance-  VERY POOR-  Has not been taking it- stopped about a year or more now.   Patient reports very little compliance  with low chol/ saturated and trans fat diet.  No exercise  Last lipid panel as follows:  Lab Results  Component Value Date   CHOL 144 03/03/2017   HDL 58 03/03/2017   LDLCALC 57 03/03/2017   TRIG 144 03/03/2017   CHOLHDL 2.5 03/03/2017   Hepatic Function Latest Ref Rng & Units 03/03/2017 04/19/2016 09/03/2015  Total Protein 6.0 - 8.5 g/dL 6.6 6.3 -  Albumin 3.5 - 4.8 g/dL 4.7 4.2 -  AST 0 - 40 IU/L '17 16 16  ' ALT 0 - 32 IU/L '15 22 19  ' Alk Phosphatase 39 - 117 IU/L 91 70 78  Total Bilirubin 0.0 - 1.2 mg/dL 0.6 0.5 -       Impression and Recommendations:    1. Type 2 diabetes mellitus with complication (HCC)- per Dr Loanne Drilling of Endo   2. Hyperlipidemia associated with type 2 diabetes mellitus ([place])   3. Personal history of noncompliance with medical treatment and regimen   4. Gastroesophageal reflux disease, esophagitis presence not specified   5. Adjustment disorder with mixed anxiety and depressed mood   6. Vitamin D deficiency   7. Counseling on health promotion and disease prevention     -Diabetes and hypothyroidism treatment per Dr. Loanne Drilling.  -Discussed with patient that an ACE inhibitor or an arm is recommended in all diabetics however with her  high potassium levels at baseline without her taking any supplements or anything to cause that, we will hold off for now.  We should obtain a CMP in the very near future since it is been almost 18 months.  Also discussed with patient that for her hyperlipidemia, irregardless of what her LDL is, it is recommended all diabetics go on a cholesterol medication.  She has gone off over a year ago and hence, we discussed importance of being on it but patient still refuses to take it.  We will recheck her fasting lipid profile in the near future since is been almost 18 months since last checked  -Discussed detriments to her health of noncompliance with medical treatment and regiment.  Reflux well controlled on over-the-counter medicine.  Mood  well-controlled on current dose of SSRI.  Will continue.  Vitamin D deficiency.  We will check levels in the near future as patient is only taking 400 IUs daily.  - As part of my medical decision making, I reviewed the following data within the [place] History obtained from pt /family, CMA notes reviewed and incorporated if applicable, Labs reviewed, Radiograph/ tests reviewed if applicable and OV notes from prior OV's with me, as well as other specialists she/he has seen since seeing me last, were all reviewed and used in my medical decision making process today.   - Additionally, discussion had with patient regarding txmnt plan, and their biases/concerns about that plan were used in my medical decision making today.   - The patient agreed with the plan and demonstrated an understanding of the instructions.   No barriers to understanding were identified.   - Red flag symptoms and signs discussed in detail.  Patient expressed understanding regarding what to do in case of emergency\ urgent symptoms.  The patient was advised to call back or seek an in-person evaluation if the symptoms worsen or if the condition fails to improve as anticipated.   Return Medicare wellness visit near future in addition to chronic care, for FBW near future; medicare wellness; otherwise f/up 4-87mo .    Orders Placed This Encounter  Procedures  . CBC with Differential/Platelet  . Comprehensive metabolic panel  . Lipid panel  . VITAMIN D 25 Hydroxy (Vit-D Deficiency, Fractures)  . B12    No orders of the defined types were placed in this encounter.   Medications Discontinued During This Encounter  Medication Reason  . losartan (COZAAR) 25 MG tablet       I provided 28+ minutes of non-face-to-face time during this encounter,with over 50% of the time in direct counseling on patients medical conditions/ medical concerns.  Additional time was spent with charting and coordination of care after  the actual visit commenced.   Note:  This note was prepared with assistance of Dragon voice recognition software. Occasional wrong-word or sound-a-like substitutions may have occurred due to the inherent limitations of voice recognition software.  DMellody Dance DO     Patient Care Team    Relationship Specialty Notifications Start End  OMellody Dance DO PCP - General Family Medicine  04/01/16   BMargaretmary Bayley MD Referring Physician Internal Medicine  04/01/16   [facility]  04/19/16    Comment: Patient had endoscopy and colonoscopy earlier in year or late 2016.    ERenato Shin MD Consulting Physician Endocrinology  08/21/16    Comment: Treats patients diabetes and thyroid     -Vitals obtained; medications/ allergies reconciled;  personal medical,  social, Sx etc.histories were updated by CMA, reviewed by me and are reflected in chart   Patient Active Problem List   Diagnosis Date Noted  . Hypertension associated with diabetes ([place]) 03/03/2017    Priority: High  . Hyperlipidemia associated with type 2 diabetes mellitus ([place]) 04/19/2016    Priority: High  . Type 2 diabetes mellitus with complication ([place]) 29/79/8921    Priority: High  . Personal history of noncompliance with medical treatment and regimen 04/19/2016    Priority: High  . Hypertriglyceridemia 05/26/2016    Priority: Medium  . Adjustment disorder with mixed anxiety and depressed mood 04/19/2016    Priority: Medium  . Hypothyroidism 04/19/2016    Priority: Medium  . Overweight (BMI 25.0-29.9) 04/19/2016    Priority: Medium  . Gastroesophageal reflux disease 04/19/2016    Priority: Medium  . Environmental and seasonal allergies 03/03/2017    Priority: Low  . 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 Basal cell carcinoma (BCC)- chin in 2012 03/03/2017    Priority: Low  . Colonoscopy refused 03/03/2017    Priority: Low  .  Hyperkalemia 05/26/2016    Priority: Low  . Vitamin D deficiency 04/19/2016    Priority: Low  . Exertional chest pain 03/15/2017  . Nausea w exertion- relieved with rest 03/15/2017  . Counseling on health promotion and disease prevention 05/26/2016     Current Meds  Medication Sig  . bromocriptine (PARLODEL) 2.5 MG tablet 1/4 tab daily  . Cholecalciferol (VITAMIN D3) 5000 units CAPS Take 1 capsule by mouth daily.  . citalopram (CELEXA) 40 MG tablet TAKE 1 TABLET BY MOUTH DAILY.  . fexofenadine (ALLEGRA) 180 MG tablet Take 1 tablet (180 mg total) by mouth daily.  . fluticasone (FLONASE) 50 MCG/ACT nasal spray Do 1 spray each nostril twice daily after your sinus rinses  . levothyroxine (SYNTHROID, LEVOTHROID) 75 MCG tablet TAKE 1 TABLET BY MOUTH DAILY BEFORE BREAKFAST.  Marland Kitchen Lysine 1000 MG TABS Take 1 tablet by mouth daily.  Marland Kitchen omeprazole (PRILOSEC) 20 MG capsule TAKE 1 CAPSULE BY MOUTH 2 TIMES A DAY BEFORE A MEAL  . ranitidine (ZANTAC) 300 MG tablet 1 tab twice daily for 1 month, then 1- 1/2 tab twice daily for GERD  . rosuvastatin (CRESTOR) 10 MG tablet TAKE 1 TABLET BY MOUTH DAILY.  . sitaGLIPtin (JANUVIA) 100 MG tablet Take 1 tablet (100 mg total) by mouth daily.     Allergies:  Allergies  Allergen Reactions  . Codeine Nausea And Vomiting  . Metformin And Related Diarrhea     ROS:  See above HPI for pertinent positives and negatives   Objective:   Blood pressure 137/79, pulse 88, height '5\' 6"'  (1.676 m), weight 177 lb (80.3 kg).  (if some vitals are omitted, this means that patient was UNABLE to obtain them even though they were asked to get them prior to OV today.  They were asked to call us at their earliest convenience with these once obtained. )  General: A & O * 3; sounds in no acute distress; in usual state of health.  Skin: Pt confirms warm and dry extremities and pink fingertips HEENT: Pt confirms lips non-cyanotic Chest: Patient confirms normal chest excursion and  movement Respiratory: speaking in full sentences, no conversational dyspnea; patient confirms no use of accessory muscles Psych: insight appears good, mood- appears full

## 2018-11-16 ENCOUNTER — Other Ambulatory Visit: Payer: Self-pay

## 2018-11-16 ENCOUNTER — Other Ambulatory Visit: Payer: Medicare PPO

## 2018-11-16 DIAGNOSIS — E1169 Type 2 diabetes mellitus with other specified complication: Secondary | ICD-10-CM | POA: Diagnosis not present

## 2018-11-16 DIAGNOSIS — Z7189 Other specified counseling: Secondary | ICD-10-CM

## 2018-11-16 DIAGNOSIS — Z9119 Patient's noncompliance with other medical treatment and regimen: Secondary | ICD-10-CM | POA: Diagnosis not present

## 2018-11-16 DIAGNOSIS — K219 Gastro-esophageal reflux disease without esophagitis: Secondary | ICD-10-CM

## 2018-11-16 DIAGNOSIS — Z91199 Patient's noncompliance with other medical treatment and regimen due to unspecified reason: Secondary | ICD-10-CM

## 2018-11-16 DIAGNOSIS — E785 Hyperlipidemia, unspecified: Secondary | ICD-10-CM

## 2018-11-16 DIAGNOSIS — F4323 Adjustment disorder with mixed anxiety and depressed mood: Secondary | ICD-10-CM

## 2018-11-16 DIAGNOSIS — R5383 Other fatigue: Secondary | ICD-10-CM | POA: Diagnosis not present

## 2018-11-16 DIAGNOSIS — E118 Type 2 diabetes mellitus with unspecified complications: Secondary | ICD-10-CM

## 2018-11-16 DIAGNOSIS — E559 Vitamin D deficiency, unspecified: Secondary | ICD-10-CM | POA: Diagnosis not present

## 2018-11-16 DIAGNOSIS — R413 Other amnesia: Secondary | ICD-10-CM | POA: Diagnosis not present

## 2018-11-17 LAB — COMPREHENSIVE METABOLIC PANEL
ALT: 9 IU/L (ref 0–32)
AST: 10 IU/L (ref 0–40)
Albumin/Globulin Ratio: 2.1 (ref 1.2–2.2)
Albumin: 4.1 g/dL (ref 3.7–4.7)
Alkaline Phosphatase: 85 IU/L (ref 39–117)
BUN/Creatinine Ratio: 19 (ref 12–28)
BUN: 16 mg/dL (ref 8–27)
Bilirubin Total: 0.4 mg/dL (ref 0.0–1.2)
CO2: 22 mmol/L (ref 20–29)
Calcium: 9.8 mg/dL (ref 8.7–10.3)
Chloride: 101 mmol/L (ref 96–106)
Creatinine, Ser: 0.86 mg/dL (ref 0.57–1.00)
GFR calc Af Amer: 77 mL/min/{1.73_m2} (ref >59)
GFR calc non Af Amer: 67 mL/min/{1.73_m2} (ref >59)
Globulin, Total: 2 g/dL (ref 1.5–4.5)
Glucose: 186 mg/dL — ABNORMAL HIGH (ref 65–99)
Potassium: 4.9 mmol/L (ref 3.5–5.2)
Sodium: 138 mmol/L (ref 134–144)
Total Protein: 6.1 g/dL (ref 6.0–8.5)

## 2018-11-17 LAB — VITAMIN B12: Vitamin B-12: 435 pg/mL (ref 232–1245)

## 2018-11-17 LAB — CBC WITH DIFFERENTIAL/PLATELET
Basophils Absolute: 0.1 10*3/uL (ref 0.0–0.2)
Basos: 1 %
EOS (ABSOLUTE): 0 10*3/uL (ref 0.0–0.4)
Eos: 1 %
Hematocrit: 39.1 % (ref 34.0–46.6)
Hemoglobin: 13.1 g/dL (ref 11.1–15.9)
Immature Grans (Abs): 0 10*3/uL (ref 0.0–0.1)
Immature Granulocytes: 0 %
Lymphocytes Absolute: 1.7 10*3/uL (ref 0.7–3.1)
Lymphs: 26 %
MCH: 28.7 pg (ref 26.6–33.0)
MCHC: 33.5 g/dL (ref 31.5–35.7)
MCV: 86 fL (ref 79–97)
Monocytes Absolute: 0.6 10*3/uL (ref 0.1–0.9)
Monocytes: 10 %
Neutrophils Absolute: 3.9 10*3/uL (ref 1.4–7.0)
Neutrophils: 62 %
Platelets: 285 10*3/uL (ref 150–450)
RBC: 4.56 x10E6/uL (ref 3.77–5.28)
RDW: 13.2 % (ref 11.7–15.4)
WBC: 6.3 10*3/uL (ref 3.4–10.8)

## 2018-11-17 LAB — LIPID PANEL
Chol/HDL Ratio: 3.3 ratio (ref 0.0–4.4)
Cholesterol, Total: 163 mg/dL (ref 100–199)
HDL: 49 mg/dL (ref >39)
LDL Calculated: 89 mg/dL (ref 0–99)
Triglycerides: 125 mg/dL (ref 0–149)
VLDL Cholesterol Cal: 25 mg/dL (ref 5–40)

## 2018-11-17 LAB — VITAMIN D 25 HYDROXY (VIT D DEFICIENCY, FRACTURES): Vit D, 25-Hydroxy: 35.7 ng/mL (ref 30.0–100.0)

## 2018-11-20 ENCOUNTER — Other Ambulatory Visit: Payer: Self-pay | Admitting: Endocrinology

## 2018-11-22 ENCOUNTER — Other Ambulatory Visit: Payer: Self-pay | Admitting: Family Medicine

## 2018-11-22 DIAGNOSIS — E118 Type 2 diabetes mellitus with unspecified complications: Secondary | ICD-10-CM

## 2018-11-22 DIAGNOSIS — E1169 Type 2 diabetes mellitus with other specified complication: Secondary | ICD-10-CM

## 2018-11-22 MED ORDER — ROSUVASTATIN CALCIUM 5 MG PO TABS: 5.0000 mg | ORAL_TABLET | Freq: Every day | ORAL | 1 refills | Status: DC

## 2018-12-04 ENCOUNTER — Other Ambulatory Visit: Payer: Self-pay | Admitting: Family Medicine

## 2018-12-04 LAB — HM DIABETES EYE EXAM

## 2019-02-15 ENCOUNTER — Other Ambulatory Visit: Payer: Self-pay

## 2019-02-20 ENCOUNTER — Encounter: Payer: Self-pay | Admitting: Endocrinology

## 2019-02-20 ENCOUNTER — Ambulatory Visit: Payer: Medicare PPO | Admitting: Endocrinology

## 2019-02-20 ENCOUNTER — Other Ambulatory Visit: Payer: Self-pay

## 2019-02-20 VITALS — BP 116/74 | HR 71 | Temp 98.4°F | Wt 172.8 lb

## 2019-02-20 DIAGNOSIS — E039 Hypothyroidism, unspecified: Secondary | ICD-10-CM | POA: Diagnosis not present

## 2019-02-20 DIAGNOSIS — E118 Type 2 diabetes mellitus with unspecified complications: Secondary | ICD-10-CM

## 2019-02-20 LAB — POCT GLYCOSYLATED HEMOGLOBIN (HGB A1C): Hemoglobin A1C: 9.1 % — AB (ref 4.0–5.6)

## 2019-02-20 LAB — T4, FREE: Free T4: 1.13 ng/dL (ref 0.60–1.60)

## 2019-02-20 LAB — TSH: TSH: 1.5 u[IU]/mL (ref 0.35–4.50)

## 2019-02-20 MED ORDER — SITAGLIPTIN PHOSPHATE 100 MG PO TABS: 50.0000 mg | ORAL_TABLET | Freq: Every day | ORAL | 11 refills | Status: DC

## 2019-02-20 MED ORDER — REPAGLINIDE 1 MG PO TABS: 1.0000 mg | ORAL_TABLET | Freq: Three times a day (TID) | ORAL | 11 refills | Status: DC

## 2019-02-20 NOTE — Patient Instructions (Addendum)
Your blood pressure is high today.  Please see your primary care provider soon, to have it rechecked.  Blood tests are requested for you today.  We'll let you know about the results.  I have sent a prescription to your pharmacy, to add "repaglinide," and:  Reduce the januvia to 1/2 pill per day.  Please come back for a follow-up appointment in 2 months.

## 2019-02-20 NOTE — Progress Notes (Signed)
Subjective:    Patient ID: Amanda Douglas, female    DOB: 1945/08/02, 73 y.o.   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 Since on 75 mcg/d, pt states she feels well in general, except for fatigue. Pt also returns for f/u of diabetes mellitus:  DM type: 2 Dx'ed: 99991111 Complications: none.   Therapy: 2 oral meds.  GDM: never DKA: never Severe hypoglycemia: never.   Pancreatitis: never Pancreatic imaging: normal on 2008 CT Other: she has never taken insulin; she did not tolerate metformin-XR (diarrrhea).    Interval history: pt states she feels well in general.  She says cbg's are in the high-100's.  She takes [place] as rx'ed, but says it is too expensive to continue.  Past Medical History:  Diagnosis Date  . Depression   . Diabetes mellitus without complication ([place])   . Hyperlipidemia   . Thyroid disease     Past Surgical History:  Procedure Laterality Date  . ANKLE FRACTURE SURGERY    . CESAREAN SECTION      Social History   Socioeconomic History  . Marital status: Married    Spouse name: Not on file  . Number of children: Not on file  . Years of education: Not on file  . Highest education level: Not on file  Occupational History  . Not on file  Social Needs  . Financial resource strain: Not on file  . Food insecurity    Worry: Not on file    Inability: Not on file  . Transportation needs    Medical: Not on file    Non-medical: Not on file  Tobacco Use  . Smoking status: Former Smoker    Packs/day: 1.00    Years: 15.00    Pack years: 15.00    Types: Cigarettes    Quit date: 06/14/1977    Years since quitting: 41.7  . Smokeless tobacco: Never Used  Substance and Sexual Activity  . Alcohol use: No  . Drug use: No  . Sexual activity: Never  Lifestyle  . Physical activity    Days per week: Not on file   Minutes per session: Not on file  . Stress: Not on file  Relationships  . Social Herbalist on phone: Not on file    Gets together: Not on file    Attends religious service: Not on file    Active member of club or organization: Not on file    Attends meetings of clubs or organizations: Not on file    Relationship status: Not on file  . Intimate partner violence    Fear of current or ex partner: Not on file    Emotionally abused: Not on file    Physically abused: Not on file    Forced sexual activity: Not on file  Other Topics Concern  . Not on file  Social History Narrative  . Not on file    Current Outpatient Medications on File Prior to Visit  Medication Sig Dispense Refill  . bromocriptine (PARLODEL) 2.5 MG tablet 1/4 tab daily 8 tablet 11  . Cholecalciferol (VITAMIN D3) 5000 units CAPS Take 1 capsule by mouth daily.    . citalopram (CELEXA) 40 MG tablet TAKE 1 TABLET BY MOUTH DAILY. 90 tablet 0  . esomeprazole (NEXIUM) 20 MG capsule Take 20 mg by mouth daily at 12  noon.    . fexofenadine (ALLEGRA) 180 MG tablet Take 1 tablet (180 mg total) by mouth daily. 90 tablet 3  . fluticasone (FLONASE) 50 MCG/ACT nasal spray Do 1 spray each nostril twice daily after your sinus rinses 16 g 2  . levothyroxine (SYNTHROID) 75 MCG tablet TAKE 1 TABLET BY MOUTH DAILY BEFORE BREAKFAST. 30 tablet 5  . Lysine 1000 MG TABS Take 1 tablet by mouth daily.    Marland Kitchen omeprazole (PRILOSEC) 20 MG capsule TAKE 1 CAPSULE BY MOUTH 2 TIMES A DAY BEFORE A MEAL 60 capsule 0  . rosuvastatin (CRESTOR) 5 MG tablet Take 1 tablet (5 mg total) by mouth at bedtime. 90 tablet 1   No current facility-administered medications on file prior to visit.     Allergies  Allergen Reactions  . Codeine Nausea And Vomiting  . Metformin And Related Diarrhea    Family History  Problem Relation Age of Onset  . Diabetes Mother   . Hypertension Mother   . Thyroid disease Mother   . Depression Mother   . COPD Sister    . Arthritis Sister   . Healthy Brother   . Healthy Daughter   . Diabetes Son   . Cancer Sister        squamous cell  . Hypertension Son     BP 116/74   Pulse 71   Temp 98.4 F (36.9 C)   Wt 172 lb 12.8 oz (78.4 kg)   SpO2 97%   BMI 27.89 kg/m    Review of Systems Denies n/v.     Objective:   Physical Exam VITAL SIGNS:  See vs page GENERAL: no distress NECK: There is no palpable thyroid enlargement.  No thyroid nodule is palpable.  No palpable lymphadenopathy at the anterior neck.   Pulses: dorsalis pedis intact bilat.   MSK: no deformity of the feet CV: trace bilat leg edema Skin:  no ulcer on the feet.  normal color and temp on the feet. Neuro: sensation is intact to touch on the feet  Lab Results  Component Value Date   CREATININE 0.86 11/16/2018   BUN 16 11/16/2018   NA 138 11/16/2018   K 4.9 11/16/2018   CL 101 11/16/2018   CO2 22 11/16/2018   Lab Results  Component Value Date   HGBA1C 9.1 (A) 02/20/2019   Lab Results  Component Value Date   TSH 1.50 02/20/2019      Assessment & Plan:  HTN: is noted today Hypothyroidism: well-replaced Type 2 DM: worse  Patient Instructions  Your blood pressure is high today.  Please see your primary care provider soon, to have it rechecked.  Blood tests are requested for you today.  We'll let you know about the results.  I have sent a prescription to your pharmacy, to add "repaglinide," and:  Reduce the januvia to 1/2 pill per day.  Please come back for a follow-up appointment in 2 months.

## 2019-03-05 ENCOUNTER — Telehealth: Payer: Self-pay

## 2019-03-05 ENCOUNTER — Other Ambulatory Visit: Payer: Self-pay | Admitting: Family Medicine

## 2019-03-05 NOTE — Telephone Encounter (Signed)
Please call pt to schedule f/u prior to any further refills.  Charyl Bigger, CMA

## 2019-04-11 ENCOUNTER — Encounter: Payer: Self-pay | Admitting: Family Medicine

## 2019-04-11 ENCOUNTER — Other Ambulatory Visit: Payer: Self-pay

## 2019-04-11 ENCOUNTER — Ambulatory Visit (INDEPENDENT_AMBULATORY_CARE_PROVIDER_SITE_OTHER): Payer: Medicare PPO | Admitting: Family Medicine

## 2019-04-11 VITALS — BP 139/66 | Temp 98.4°F | Wt 168.0 lb

## 2019-04-11 DIAGNOSIS — Z9119 Patient's noncompliance with other medical treatment and regimen: Secondary | ICD-10-CM | POA: Diagnosis not present

## 2019-04-11 DIAGNOSIS — I10 Essential (primary) hypertension: Secondary | ICD-10-CM

## 2019-04-11 DIAGNOSIS — Z5320 Procedure and treatment not carried out because of patient's decision for unspecified reasons: Secondary | ICD-10-CM | POA: Diagnosis not present

## 2019-04-11 DIAGNOSIS — E1169 Type 2 diabetes mellitus with other specified complication: Secondary | ICD-10-CM | POA: Diagnosis not present

## 2019-04-11 DIAGNOSIS — E118 Type 2 diabetes mellitus with unspecified complications: Secondary | ICD-10-CM

## 2019-04-11 DIAGNOSIS — Z23 Encounter for immunization: Secondary | ICD-10-CM

## 2019-04-11 DIAGNOSIS — F4323 Adjustment disorder with mixed anxiety and depressed mood: Secondary | ICD-10-CM

## 2019-04-11 DIAGNOSIS — E785 Hyperlipidemia, unspecified: Secondary | ICD-10-CM | POA: Diagnosis not present

## 2019-04-11 DIAGNOSIS — E1159 Type 2 diabetes mellitus with other circulatory complications: Secondary | ICD-10-CM

## 2019-04-11 DIAGNOSIS — E559 Vitamin D deficiency, unspecified: Secondary | ICD-10-CM | POA: Diagnosis not present

## 2019-04-11 DIAGNOSIS — Z91199 Patient's noncompliance with other medical treatment and regimen due to unspecified reason: Secondary | ICD-10-CM

## 2019-04-11 MED ORDER — ROSUVASTATIN CALCIUM 5 MG PO TABS: 5.0000 mg | ORAL_TABLET | Freq: Every day | ORAL | 1 refills | Status: DC

## 2019-04-11 MED ORDER — CITALOPRAM HYDROBROMIDE 10 MG PO TABS: ORAL_TABLET | ORAL | 0 refills | Status: DC

## 2019-04-11 NOTE — Progress Notes (Signed)
Telehealth office visit note for Amanda Douglas, D.O- at Primary Care at [facility]   I connected with current patient today and verified that I am speaking with the correct person using two identifiers.   . Location of the patient: Home . Location of the provider: Office Only the patient (+/- their family members at pt's discretion) and myself were participating in the encounter - This visit type was conducted due to national recommendations for restrictions regarding the COVID-19 Pandemic (e.g. social distancing) in an effort to limit this patient's exposure and mitigate transmission in our community.  This format is felt to be most appropriate for this patient at this time.   - The patient did not have access to video technology or had technical difficulties with video requiring transitioning to audio format only. - No physical exam could be performed with this format, beyond that communicated to [place] by the patient/ family members as noted.   - Additionally my office staff/ schedulers discussed with the patient that there may be a monetary charge related to this service, depending on their medical insurance.   The patient expressed understanding, and agreed to proceed.      History of Present Illness:  I, Amanda Douglas, am serving as scribe for Amanda Douglas.  Per patient, has been alright, "been inside mostly, hibernating, you know."  - Mood Management; on Citalopram long-term Regarding her citalopram, states "I probably really don't need it, because when I went on it several years ago, I was having a hard time with menopause; but I probably should get off of it."  HPI:   Diabetes Mellitus: Followed by Dr. Loanne Douglas  Home glucose readings:  Says her sugars "weren't too good the last time; he's trying different medications for me, and what he gave wasn't really doing any good at all."  Notes her sugars are running "mostly 140."  Confirms she started Prandin and  continues on Januvia. States she was taken off of bromocriptine.   - Patient reports good compliance with therapy plan: medication and/or lifestyle modification  - Her denies acute concerns or problems related to treatment plan  - She denies new concerns.  Denies polyuria/polydipsia, hypo/ hyperglycemia symptoms.  Denies new onset of: chest pain, exercise intolerance, shortness of breath, dizziness, visual changes, headache, lower extremity swelling or claudication.   Last A1C in the office was:  Lab Results  Component Value Date   HGBA1C 9.1 (A) 02/20/2019   HGBA1C 7.9 (A) 10/20/2018   HGBA1C 7.7 (A) 08/21/2018   Lab Results  Component Value Date   MICROALBUR 10 08/11/2017   LDLCALC 89 11/16/2018   CREATININE 0.86 11/16/2018   BP Readings from Last 3 Encounters:  04/11/19 139/66  02/20/19 116/74  10/31/18 137/79   Wt Readings from Last 3 Encounters:  04/11/19 168 lb (76.2 kg)  02/20/19 172 lb 12.8 oz (78.4 kg)  10/31/18 177 lb (80.3 kg)   HPI:  Hypertension:  -  Her blood pressure at home has been running: Notes her BP runs on average around 139/66, only elevated when she checks it after moving around.  - Her denies acute concerns or problems related to treatment plan  - She denies new onset of: chest pain, exercise intolerance, shortness of breath, dizziness, visual changes, headache, lower extremity swelling or claudication.   Last 3 blood pressure readings in our office are as follows: BP Readings from Last 3 Encounters:  04/11/19 139/66  02/20/19 116/74  10/31/18 137/79  Filed Weights   04/11/19 0950  Weight: 168 lb (76.2 kg)    HPI:  Hyperlipidemia:  73 y.o. female here for cholesterol follow-up.   - Patient reports good compliance with treatment plan of:  medication and/ or lifestyle management.    Says "I'm not good with time now," but thinks she's been taking her meds for three months.  - Patient denies any acute concerns or problems with  management plan   - She denies new onset of: myalgias, arthralgias, increased fatigue more than normal, chest pains, exercise intolerance, shortness of breath, dizziness, visual changes, headache, lower extremity swelling or claudication.   Most recent cholesterol panel was:  Lab Results  Component Value Date   CHOL 163 11/16/2018   HDL 49 11/16/2018   LDLCALC 89 11/16/2018   TRIG 125 11/16/2018   CHOLHDL 3.3 11/16/2018   Hepatic Function Latest Ref Rng & Units 11/16/2018 03/03/2017 04/19/2016  Total Protein 6.0 - 8.5 g/dL 6.1 6.6 6.3  Albumin 3.7 - 4.7 g/dL 4.1 4.7 4.2  AST 0 - 40 IU/L _0 ALT 0 - 32 IU/L _1 Alk Phosphatase 39 - 117 IU/L 85 91 70  Total Bilirubin 0.0 - 1.2 mg/dL 0.4 0.6 0.5     GAD 7 : Generalized Anxiety Score 10/31/2018 10/29/2016  Nervous, Anxious, on Edge 0 0  Control/stop worrying 0 0  Worry too much - different things 0 0  Trouble relaxing 0 0  Restless 0 0  Easily annoyed or irritable 0 0  Afraid - awful might happen 0 0  Total GAD 7 Score 0 0  Anxiety Difficulty Not difficult at all Not difficult at all    Depression screen [facility] 2/9 04/11/2019 10/31/2018 12/05/2017 08/11/2017 03/15/2017  Decreased Interest 0 0 0 0 0  Down, Depressed, Hopeless 0 0 1 0 0  PHQ - 2 Score 0 0 1 0 0  Altered sleeping 0 0 1 0 0  Tired, decreased energy 1 0 1 0 0  Change in appetite 1 0 1 0 0  Feeling bad or failure about yourself  0 0 0 0 0  Trouble concentrating 0 0 0 0 0  Moving slowly or fidgety/restless 0 0 0 0 0  Suicidal thoughts 0 0 0 0 0  PHQ-9 Score 2 0 4 0 0  Difficult doing work/chores Not difficult at all Not difficult at all Somewhat difficult Not difficult at all Not difficult at all      Impression and Recommendations:    1. Type 2 diabetes mellitus with complication (HCC)- per Dr Amanda Douglas of Endo   2. Hyperlipidemia associated with type 2 diabetes mellitus ([place])   3. Hypertension associated with diabetes ([place])   4. Patient refuses to take  medication   5. Personal history of noncompliance with medical treatment and regimen   6. Adjustment disorder with mixed anxiety and depressed mood   7. Need for influenza vaccination   8. Vitamin D deficiency      Adjustment Disorder with Mixed Anxiety & Depressed Mood - Per pt, unsure if she needs to be on citalopram anymore. - If patient wishes to discontinue, discussed weaning down on dose.  - New prescription (20 mg) provided today, and taper written. - After 30 days on half-dose (20 mg), reduce to 10 mg for 30 days, and then 5 mg for 30 days.  - Advised patient that if at any time she is feeling poorly, she should bump her dose back  up.  - Extensive education provided and all questions answered. - Extensively reviewed risks of reducing mood medicines and need to slowly taper off.  - Will continue to monitor closely.  Diabetes Mellitus - Per Dr. Loanne Douglas of Endocrinology - A1c was 9.1 last check. - Per patient, had several medications changed recently through endocrinology. - Per patient, currently managed on Prandin and Januvia.  - Reviewed treatment plan with patient during appointment today.  Discussed additional options for blood sugar control, including injectables.  Education provided and all questions answered.  - Advised patient to ask Dr. Loanne Douglas of endocrinology regarding further options. - Endocrinology will continue to manage and adjust patient's medications.  - Will continue to monitor.  Hypertension associated with DM - Patient refuses to take medication - Reviewed importance of kidney protection in patients with diabetes. - Patient knows to let the clinic know if she changes her mind and wishes to begin kidney protective BP medication.  - Discussed importance of checking BP after sitting quietly for 10-15 minutes. - Prudent ambulatory BP monitoring encouraged.   - Will continue to monitor.  Hyperlipidemia associated with Type 2 DM - History of  noncompliance - Cholesterol last checked 4 months ago, not at goal at that time. - In the past, patient was not taking her statin regularly. - Discussed that now that patient has been taking her statin more consistently, she can return in near future for full fasting lab work.  - Encouraged patient to take medications as prescribed.  See med list.  - Will continue to monitor and re-check as advised.  Recommendations - Return for OV in 3 months with full fasting blood work 3 days prior.   - As part of my medical decision making, I reviewed the following data within the [place] History obtained from pt /family, CMA notes reviewed and incorporated if applicable, Labs reviewed, Radiograph/ tests reviewed if applicable and OV notes from prior OV's with me, as well as other specialists she/he has seen since seeing me last, were all reviewed and used in my medical decision making process today.    - Additionally, discussion had with patient regarding our treatment plan, and their biases/concerns about that plan were used in my medical decision making today.    - The patient agreed with the plan and demonstrated an understanding of the instructions.   No barriers to understanding were identified.     - The patient was advised to call back or seek an in-person evaluation if the symptoms worsen or if the condition fails to improve as anticipated.   Return for OV in 3 months with fasting blood work 3 days prior..    Orders Placed This Encounter  Procedures  . Flu Vaccine QUAD 6+ mos PF IM (Fluarix Quad PF)  . Lipid panel  . Comprehensive metabolic panel  . VITAMIN D 25 Hydroxy (Vit-D Deficiency, Fractures)  . Hemoglobin A1c  . Microalbumin / creatinine urine ratio    Meds ordered this encounter  Medications  . citalopram (CELEXA) 10 MG tablet    Sig: Take 2 tablets (20 mg total) by mouth daily for 30 days, THEN 1 tablet (10 mg total) daily for 30 days, THEN 0.5 tablets  (5 mg total) daily.    Dispense:  110 tablet    Refill:  0  . rosuvastatin (CRESTOR) 5 MG tablet    Sig: Take 1 tablet (5 mg total) by mouth at bedtime.    Dispense:  90 tablet  Refill:  1    Medications Discontinued During This Encounter  Medication Reason  . citalopram (CELEXA) 40 MG tablet   . rosuvastatin (CRESTOR) 5 MG tablet Reorder     I provided 16 minutes of non face-to-face time during this encounter.  Additional time was spent with charting and coordination of care after the actual visit commenced.   Note:  This note was prepared with assistance of Dragon voice recognition software. Occasional wrong-word or sound-a-like substitutions may have occurred due to the inherent limitations of voice recognition software.   This document serves as a record of services personally performed by Amanda Dance, DO. It was created on her behalf by Amanda Douglas, a trained medical scribe. The creation of this record is based on the scribe's personal observations and the provider's statements to them.   This case required medical decision making of at least moderate complexity. The above documentation has been reviewed to be accurate and was completed by Marjory Sneddon, D.O.      Patient Care Team    Relationship Specialty Notifications Start End  Amanda Dance, DO PCP - General Family Medicine  04/01/16   Margaretmary Bayley, MD Referring Physician Internal Medicine  04/01/16   Gastroenterology, [facility] Physician Gastroenterology  04/19/16    Comment: Patient had endoscopy and colonoscopy earlier in year or late 2016.    Renato Shin, MD Consulting Physician Endocrinology  08/21/16    Comment: Treats patients diabetes and thyroid     -Vitals obtained; medications/ allergies reconciled;  personal medical, social, Sx etc.histories were updated by CMA, reviewed by me and are reflected in chart   Patient Active Problem List   Diagnosis Date Noted  .  Hypertension associated with diabetes ([place]) 03/03/2017    Priority: High  . Hyperlipidemia associated with type 2 diabetes mellitus ([place]) 04/19/2016    Priority: High  . Type 2 diabetes mellitus with complication ([place]) [id number]/9629    Priority: High  . Personal history of noncompliance with medical treatment and regimen 04/19/2016    Priority: High  . Hypertriglyceridemia 05/26/2016    Priority: Medium  . Adjustment disorder with mixed anxiety and depressed mood 04/19/2016    Priority: Medium  . Hypothyroidism 04/19/2016    Priority: Medium  . Overweight (BMI 25.0-29.9) 04/19/2016    Priority: Medium  . Gastroesophageal reflux disease 04/19/2016    Priority: Medium  . Environmental and seasonal allergies 03/03/2017    Priority: Low  . 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 Basal cell carcinoma (BCC)- chin in 2012 03/03/2017    Priority: Low  . Colonoscopy refused 03/03/2017    Priority: Low  . Hyperkalemia 05/26/2016    Priority: Low  . Vitamin D deficiency 04/19/2016    Priority: Low  . Patient refuses to take medication 04/11/2019  . Exertional chest pain 03/15/2017  . Nausea w exertion- relieved with rest 03/15/2017  . Counseling on health promotion and disease prevention 05/26/2016     Current Meds  Medication Sig  . Cholecalciferol (VITAMIN D3) 5000 units CAPS Take 1 capsule by mouth daily.  . citalopram (CELEXA) 10 MG tablet Take 2 tablets (20 mg total) by mouth daily for 30 days, THEN 1 tablet (10 mg total) daily for 30 days, THEN 0.5 tablets (5 mg total) daily.  Marland Kitchen esomeprazole (NEXIUM) 20 MG capsule Take 20 mg by mouth daily at 12 noon.  . fexofenadine (ALLEGRA) 180 MG tablet Take 1 tablet (180 mg total) by mouth  daily.  . fluticasone (FLONASE) 50 MCG/ACT nasal spray Do 1 spray each nostril twice daily after your sinus rinses  . levothyroxine (SYNTHROID) 75 MCG tablet TAKE 1 TABLET BY MOUTH DAILY BEFORE BREAKFAST.  Marland Kitchen Lysine 1000 MG TABS Take 1  tablet by mouth daily.  . repaglinide (PRANDIN) 1 MG tablet Take 1 tablet (1 mg total) by mouth 3 (three) times daily before meals.  . rosuvastatin (CRESTOR) 5 MG tablet Take 1 tablet (5 mg total) by mouth at bedtime.  . sitaGLIPtin (JANUVIA) 100 MG tablet Take 0.5 tablets (50 mg total) by mouth daily.  . [DISCONTINUED] citalopram (CELEXA) 40 MG tablet Take 1 tablet (40 mg total) by mouth daily. OFFICE VISIT REQUIRED PRIOR TO ANY FURTHER REFILLS  . [DISCONTINUED] rosuvastatin (CRESTOR) 5 MG tablet Take 1 tablet (5 mg total) by mouth at bedtime.     Allergies:  Allergies  Allergen Reactions  . Codeine Nausea And Vomiting  . Metformin And Related Diarrhea     ROS:  See above HPI for pertinent positives and negatives   Objective:   Blood pressure 139/66, temperature 98.4 F (36.9 C), temperature source Oral, weight 168 lb (76.2 kg).  (if some vitals are omitted, this means that patient was UNABLE to obtain them even though they were asked to get them prior to OV today.  They were asked to call us at their earliest convenience with these once obtained. )  General: A & O * 3; sounds in no acute distress; in usual state of health.  Skin: Pt confirms warm and dry extremities and pink fingertips HEENT: Pt confirms lips non-cyanotic Chest: Patient confirms normal chest excursion and movement Respiratory: speaking in full sentences, no conversational dyspnea; patient confirms no use of accessory muscles Psych: insight appears good, mood- appears full

## 2019-04-24 ENCOUNTER — Ambulatory Visit: Payer: Medicare PPO | Admitting: Endocrinology

## 2019-05-01 ENCOUNTER — Ambulatory Visit (INDEPENDENT_AMBULATORY_CARE_PROVIDER_SITE_OTHER): Payer: Medicare PPO | Admitting: Family Medicine

## 2019-05-01 ENCOUNTER — Encounter: Payer: Self-pay | Admitting: Family Medicine

## 2019-05-01 ENCOUNTER — Other Ambulatory Visit: Payer: Medicare PPO

## 2019-05-01 ENCOUNTER — Other Ambulatory Visit: Payer: Self-pay

## 2019-05-01 VITALS — Ht 66.0 in | Wt 168.0 lb

## 2019-05-01 DIAGNOSIS — E2839 Other primary ovarian failure: Secondary | ICD-10-CM | POA: Diagnosis not present

## 2019-05-01 DIAGNOSIS — E1159 Type 2 diabetes mellitus with other circulatory complications: Secondary | ICD-10-CM

## 2019-05-01 DIAGNOSIS — C50311 Malignant neoplasm of lower-inner quadrant of right female breast: Secondary | ICD-10-CM | POA: Diagnosis not present

## 2019-05-01 DIAGNOSIS — E118 Type 2 diabetes mellitus with unspecified complications: Secondary | ICD-10-CM | POA: Diagnosis not present

## 2019-05-01 DIAGNOSIS — C50312 Malignant neoplasm of lower-inner quadrant of left female breast: Secondary | ICD-10-CM

## 2019-05-01 DIAGNOSIS — Z1231 Encounter for screening mammogram for malignant neoplasm of breast: Secondary | ICD-10-CM

## 2019-05-01 DIAGNOSIS — Z0000 Encounter for general adult medical examination without abnormal findings: Secondary | ICD-10-CM | POA: Diagnosis not present

## 2019-05-01 DIAGNOSIS — E785 Hyperlipidemia, unspecified: Secondary | ICD-10-CM | POA: Diagnosis not present

## 2019-05-01 DIAGNOSIS — E1169 Type 2 diabetes mellitus with other specified complication: Secondary | ICD-10-CM

## 2019-05-01 DIAGNOSIS — Z1159 Encounter for screening for other viral diseases: Secondary | ICD-10-CM | POA: Diagnosis not present

## 2019-05-01 DIAGNOSIS — Z780 Asymptomatic menopausal state: Secondary | ICD-10-CM

## 2019-05-01 DIAGNOSIS — E559 Vitamin D deficiency, unspecified: Secondary | ICD-10-CM

## 2019-05-01 DIAGNOSIS — I10 Essential (primary) hypertension: Secondary | ICD-10-CM | POA: Diagnosis not present

## 2019-05-01 NOTE — Progress Notes (Signed)
Subjective:   Amanda Douglas is a 73 y.o. female who presents for Medicare Annual (Subsequent) preventive examination.  HPI: Per patient, hates shots, and thought she was excempt from future mammograms and colonoscopy due to her age. Notes "I haven't smoked in about forty years." Thinks it's been years since her last DEXA screen, because her former provider stopped taking her insurance. Patient denies concerns with her memory today; says "I think it's just normal." Feels her memory loss is "just age-related, that's all." Overall, patient denies concerns today.   Objective:    Vitals: Ht 5\' 6"  (1.676 m)   Wt 168 lb (76.2 kg)   BMI 27.12 kg/m   Body mass index is 27.12 kg/m.  Advanced Directives 03/03/2017 04/19/2016  Does Patient Have a Medical Advance Directive? No No  Would patient like information on creating a medical advance directive? No - Patient declined No - patient declined information    Tobacco Social History   Tobacco Use  Smoking Status Former Smoker  . Packs/day: 1.00  . Years: 15.00  . Pack years: 15.00  . Types: Cigarettes  . Quit date: 06/14/1977  . Years since quitting: 41.9  Smokeless Tobacco Never Used       Past Medical History:  Diagnosis Date  . Depression   . Diabetes mellitus without complication ([place])   . Hyperlipidemia   . Thyroid disease     Past Surgical History:  Procedure Laterality Date  . ANKLE FRACTURE SURGERY    . CESAREAN SECTION      Family History  Problem Relation Age of Onset  . Diabetes Mother   . Hypertension Mother   . Thyroid disease Mother   . Depression Mother   . COPD Sister   . Arthritis Sister   . Healthy Brother   . Healthy Daughter   . Diabetes Son   . Cancer Sister        squamous cell  . Hypertension Son     Social History   Socioeconomic History  . Marital status: Married    Spouse name: Not on file  . Number of children: Not on file  . Years of education: Not on file  . Highest education  level: Not on file  Occupational History  . Not on file  Social Needs  . Financial resource strain: Not on file  . Food insecurity    Worry: Not on file    Inability: Not on file  . Transportation needs    Medical: Not on file    Non-medical: Not on file  Tobacco Use  . Smoking status: Former Smoker    Packs/day: 1.00    Years: 15.00    Pack years: 15.00    Types: Cigarettes    Quit date: 06/14/1977    Years since quitting: 41.9  . Smokeless tobacco: Never Used  Substance and Sexual Activity  . Alcohol use: No  . Drug use: No  . Sexual activity: Never  Lifestyle  . Physical activity    Days per week: Not on file    Minutes per session: Not on file  . Stress: Not on file  Relationships  . Social Herbalist on phone: Not on file    Gets together: Not on file    Attends religious service: Not on file    Active member of club or organization: Not on file    Attends meetings of clubs or organizations: Not on file    Relationship  status: Not on file  Other Topics Concern  . Not on file  Social History Narrative  . Not on file    Outpatient Encounter Medications as of 05/01/2019  Medication Sig  . Cholecalciferol (VITAMIN D3) 5000 units CAPS Take 1 capsule by mouth daily.  . citalopram (CELEXA) 10 MG tablet Take 2 tablets (20 mg total) by mouth daily for 30 days, THEN 1 tablet (10 mg total) daily for 30 days, THEN 0.5 tablets (5 mg total) daily.  Marland Kitchen esomeprazole (NEXIUM) 20 MG capsule Take 20 mg by mouth daily at 12 noon.  . fexofenadine (ALLEGRA) 180 MG tablet Take 1 tablet (180 mg total) by mouth daily.  . fluticasone (FLONASE) 50 MCG/ACT nasal spray Do 1 spray each nostril twice daily after your sinus rinses  . levothyroxine (SYNTHROID) 75 MCG tablet TAKE 1 TABLET BY MOUTH DAILY BEFORE BREAKFAST.  Marland Kitchen Lysine 1000 MG TABS Take 1 tablet by mouth daily.  Marland Kitchen omeprazole (PRILOSEC) 20 MG capsule TAKE 1 CAPSULE BY MOUTH 2 TIMES A DAY BEFORE A MEAL  . repaglinide  (PRANDIN) 1 MG tablet Take 1 tablet (1 mg total) by mouth 3 (three) times daily before meals.  . sitaGLIPtin (JANUVIA) 100 MG tablet Take 0.5 tablets (50 mg total) by mouth daily.  . [DISCONTINUED] rosuvastatin (CRESTOR) 5 MG tablet Take 1 tablet (5 mg total) by mouth at bedtime.  . [DISCONTINUED] bromocriptine (PARLODEL) 2.5 MG tablet 1/4 tab daily (Patient not taking: Reported on 04/11/2019)   No facility-administered encounter medications on file as of 05/01/2019.     Height 5\' 6"  (1.676 m), weight 168 lb (76.2 kg).   Activities of Daily Living In your present state of health, do you have any difficulty performing the following activities: 05/01/2019 10/31/2018  Hearing? N N  Vision? N N  Difficulty concentrating or making decisions? Y N  Walking or climbing stairs? N N  Dressing or bathing? N N  Doing errands, shopping? N N  Some recent data might be hidden    Patient Care Team: [name], DO as PCP - General (Family Medicine) Margaretmary Bayley, MD as Referring Physician (Internal Medicine) Gastroenterology, [place] as Consulting Physician (Gastroenterology) Renato Shin, MD as Consulting Physician (Endocrinology)    Assessment:   This is a routine wellness examination for [place].  Exercise Activities and Dietary recommendations    Goals   None     Fall Risk Fall Risk  05/01/2019 04/11/2019 04/11/2019 10/31/2018 12/05/2017  Falls in the past year? 0 0 0 0 No  Number falls in past yr: 0 0 0 - -  Injury with Fall? 0 0 0 - -  Follow up Falls evaluation completed - - Falls evaluation completed -   Is the patient's home free of loose throw rugs in walkways, pet beds, electrical cords, etc?   yes      Grab bars in the bathroom? yes      Handrails on the stairs?   yes      Adequate lighting?   yes  Timed Get Up and Go performed: n/a  Depression Screen PHQ 2/9 Scores 05/01/2019 04/11/2019 10/31/2018 12/05/2017  PHQ - 2 Score 0 0 0 1  PHQ- 9 Score 2 2 0 4      Cognitive Function MMSE - Mini Mental State Exam 05/01/2019  Orientation to time 5  Orientation to Place 5  Registration 3  Attention/ Calculation 5  Recall 3  Language- name 2 objects 2  Language- repeat 1  Language-  follow 3 step command (No Data)  Language- follow 3 step command-comments Virtual appointment  Language- read & follow direction (No Data)  Language-read & follow direction-comments virtual appointment  Write a sentence (No Data)  Write a sentence-comments virtual appointment  Copy design (No Data)  Copy design-comments virtual appointment     6CIT Screen 05/01/2019  What Year? 0 points  What month? 0 points  What time? 0 points  Count back from 20 0 points  Months in reverse 0 points  Repeat phrase 4 points  Total Score 4    There is no immunization history for the selected administration types on file for this patient.  Qualifies for Shingles Vaccine? Yes, needed Pneumococcal Vaccine is needed Influenza Vaccine is needed Screening Tests Health Maintenance  Topic Date Due  . OPHTHALMOLOGY EXAM  10/27/2017  . INFLUENZA VACCINE  01/13/2019  . DEXA SCAN  10/31/2019 (Originally 07/10/2009)  . TETANUS/TDAP  10/31/2019 (Originally 07/11/1963)  . Hepatitis C Screening  10/31/2019 (Originally March 17, 1946)  . PNA vac Low Risk Adult (1 of 2 - PCV13) 10/31/2019 (Originally 07/10/2009)  . MAMMOGRAM  04/10/2020 (Originally 02/07/2014)  . FOOT EXAM  08/21/2019  . HEMOGLOBIN A1C  10/29/2019  . URINE MICROALBUMIN  04/30/2020  . COLONOSCOPY  03/03/2025    Plan:    PLAN: - Discussed need for fasting lab work during appointment today. - Per patient, obtained lab work this morning, but was not fasting. - Discussed that last mammogram on record was obtained in 2013. - Reviewed need for mammogram yearly from age 63-75. - Given patient's lack of recent mammogram, recommended ordering one final mammogram today. - Referral provided today for mammography screening through the  [facility]. - Per patient, last had colonoscopy done three years ago in [facility]. - Reviewed that future screening will be ordered depending on results and report of last colonoscopy. - Per patient, "I had two very small polyps," but cannot remember indications for follow-up. - Recommended that patient call gastroenterologist that performed her colonoscopy and ask about proper follow-up. - Need for updated TDAP. - Need for shingles vaccination. Reviewed need for second shot within relevant time frame. - Need for pneumonia vaccination. - Need for influenza vaccination. - Patient will come to clinic to obtain immunizations as required. - Discussed low risk Hep C/HIV screen with patient today. - Need for DEXA scan. Referral provided today. - Education provided today regarding need for up-to-date screenings and immunizations. Lengthy conversation held and all questions answered. - Patient will call to receive vaccinations near future. - Return as scheduled in 4-6 months for chronic follow-up. - Per patient, returns to see Dr. Loanne Drilling every three months. - Advised patient to continue checking her BP at home. Reviewed goal BP with patient.   I have personally reviewed and noted the following in the patient's chart:   . Medical and social history . Use of alcohol, tobacco or illicit drugs  . Current medications and supplements . Functional ability and status . Nutritional status . Physical activity . Advanced directives . List of other physicians . Hospitalizations, surgeries, and ER visits in previous 12 months . Vitals . Screenings to include cognitive, depression, and falls . Referrals and appointments  In addition, I have reviewed and discussed with patient certain preventive protocols, quality metrics, and best practice recommendations. A written personalized care plan for preventive services as well as general preventive health recommendations were provided to patient.     [name],  DO  05/01/2019

## 2019-05-02 LAB — COMPREHENSIVE METABOLIC PANEL WITH GFR
ALT: 12 IU/L (ref 0–32)
AST: 13 IU/L (ref 0–40)
Albumin/Globulin Ratio: 1.8 (ref 1.2–2.2)
Albumin: 3.9 g/dL (ref 3.7–4.7)
Alkaline Phosphatase: 80 IU/L (ref 39–117)
BUN/Creatinine Ratio: 20 (ref 12–28)
BUN: 17 mg/dL (ref 8–27)
Bilirubin Total: 0.4 mg/dL (ref 0.0–1.2)
CO2: 25 mmol/L (ref 20–29)
Calcium: 9.1 mg/dL (ref 8.7–10.3)
Chloride: 105 mmol/L (ref 96–106)
Creatinine, Ser: 0.85 mg/dL (ref 0.57–1.00)
GFR calc Af Amer: 78 mL/min/1.73
GFR calc non Af Amer: 68 mL/min/1.73
Globulin, Total: 2.2 g/dL (ref 1.5–4.5)
Glucose: 178 mg/dL — ABNORMAL HIGH (ref 65–99)
Potassium: 5.1 mmol/L (ref 3.5–5.2)
Sodium: 139 mmol/L (ref 134–144)
Total Protein: 6.1 g/dL (ref 6.0–8.5)

## 2019-05-02 LAB — LIPID PANEL
Chol/HDL Ratio: 3.6 ratio (ref 0.0–4.4)
Cholesterol, Total: 172 mg/dL (ref 100–199)
HDL: 48 mg/dL (ref >39)
LDL Chol Calc (NIH): 95 mg/dL (ref 0–99)
Triglycerides: 170 mg/dL — ABNORMAL HIGH (ref 0–149)
VLDL Cholesterol Cal: 29 mg/dL (ref 5–40)

## 2019-05-02 LAB — HEMOGLOBIN A1C
Est. average glucose Bld gHb Est-mCnc: 180 mg/dL
Hgb A1c MFr Bld: 7.9 % — ABNORMAL HIGH (ref 4.8–5.6)

## 2019-05-02 LAB — MICROALBUMIN / CREATININE URINE RATIO
Creatinine, Urine: 47.3 mg/dL
Microalb/Creat Ratio: <6 mg/g creat (ref 0–29)
Microalbumin, Urine: <3 ug/mL

## 2019-05-02 LAB — VITAMIN D 25 HYDROXY (VIT D DEFICIENCY, FRACTURES): Vit D, 25-Hydroxy: 36.5 ng/mL (ref 30.0–100.0)

## 2019-05-08 ENCOUNTER — Telehealth: Payer: Self-pay | Admitting: Family Medicine

## 2019-05-08 DIAGNOSIS — E1169 Type 2 diabetes mellitus with other specified complication: Secondary | ICD-10-CM

## 2019-05-08 MED ORDER — ROSUVASTATIN CALCIUM 20 MG PO TABS: ORAL_TABLET | ORAL | 0 refills | Status: DC

## 2019-05-08 NOTE — Telephone Encounter (Signed)
-----   Message from [name], DO sent at 05/08/2019  6:08 AM EST ----- Chol not at goal--> inc crestor dose from 5mg  to 10mg !   If pt agrees, please send in 20mg  tab with sig of 1/2 tab po q hs disp 45, no RF as she will need FLP and ALT reck in 8wks or so. Please notify pt we will need to properly monitor treatment and have her call for follow-up lab work in 8 weeks or so.  Please place future order for FLP and ALT with diagnosis of hyperlipidemia if patient agrees to all the above  -Tell patient her A1c is under better control than prior.  Tell her that her endocrinologist Dr. Loanne Drilling will be able to see these results in the chart in case he would like to modify her treatment plan.  All rest the labs are stable/or essentially within normal limits.  Patient should follow-up in 3 months from her last OV with [place].

## 2019-05-08 NOTE — Telephone Encounter (Signed)
Can you please contact patient to schedule 8 week fasting labs for Lipid panel and ALT. Labs ordered.  Patient is aware of the lab results and verbalized understanding. Patient aware Crestor sent to [facility]. AS, CMA

## 2019-05-09 ENCOUNTER — Encounter: Payer: Self-pay | Admitting: Family Medicine

## 2019-05-23 LAB — SPECIMEN STATUS REPORT

## 2019-05-23 LAB — HEPATITIS C ANTIBODY: Hep C Virus Ab: <0.1 s/co ratio (ref 0.0–0.9)

## 2019-05-24 ENCOUNTER — Other Ambulatory Visit: Payer: Self-pay | Admitting: Endocrinology

## 2019-05-28 ENCOUNTER — Other Ambulatory Visit: Payer: Self-pay

## 2019-05-30 ENCOUNTER — Ambulatory Visit (INDEPENDENT_AMBULATORY_CARE_PROVIDER_SITE_OTHER): Payer: Medicare PPO | Admitting: Family Medicine

## 2019-05-30 ENCOUNTER — Encounter: Payer: Self-pay | Admitting: Endocrinology

## 2019-05-30 ENCOUNTER — Ambulatory Visit: Payer: Medicare PPO | Admitting: Endocrinology

## 2019-05-30 ENCOUNTER — Other Ambulatory Visit: Payer: Self-pay

## 2019-05-30 ENCOUNTER — Encounter: Payer: Self-pay | Admitting: Family Medicine

## 2019-05-30 VITALS — BP 146/68 | Wt 168.0 lb

## 2019-05-30 VITALS — BP 158/88 | HR 85 | Ht 66.0 in | Wt 168.8 lb

## 2019-05-30 DIAGNOSIS — K591 Functional diarrhea: Secondary | ICD-10-CM

## 2019-05-30 DIAGNOSIS — E118 Type 2 diabetes mellitus with unspecified complications: Secondary | ICD-10-CM

## 2019-05-30 DIAGNOSIS — E039 Hypothyroidism, unspecified: Secondary | ICD-10-CM | POA: Diagnosis not present

## 2019-05-30 DIAGNOSIS — K9289 Other specified diseases of the digestive system: Secondary | ICD-10-CM | POA: Diagnosis not present

## 2019-05-30 DIAGNOSIS — F4323 Adjustment disorder with mixed anxiety and depressed mood: Secondary | ICD-10-CM

## 2019-05-30 DIAGNOSIS — K589 Irritable bowel syndrome without diarrhea: Secondary | ICD-10-CM

## 2019-05-30 MED ORDER — REPAGLINIDE 2 MG PO TABS: 2.0000 mg | ORAL_TABLET | Freq: Three times a day (TID) | ORAL | 11 refills | Status: DC

## 2019-05-30 MED ORDER — DICYCLOMINE HCL 10 MG PO CAPS: ORAL_CAPSULE | ORAL | 0 refills | Status: DC

## 2019-05-30 NOTE — Patient Instructions (Addendum)
Your blood pressure is high today.  Please see your primary care provider soon, to have it rechecked.   I have sent a prescription to your pharmacy, to double the repaglinide, and:  Please stop taking the Januvia.  Please come back for a follow-up appointment in 6 weeks.

## 2019-05-30 NOTE — Progress Notes (Signed)
Subjective:    Patient ID: Amanda Douglas, female    DOB: 28-May-1946, 73 y.o.   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 Since on 75 mcg/d, pt states she feels well in general, except for fatigue. Pt also returns for f/u of diabetes mellitus:  DM type: 2 Dx'ed: 99991111 Complications: none.   Therapy: 2 oral meds.  GDM: never DKA: never Severe hypoglycemia: never.   Pancreatitis: never Pancreatic imaging: normal on 2008 CT Other: she has never taken insulin; she did not tolerate metformin-XR (diarrrhea).    Interval history: pt states she feels well in general.  She says cbg's vary from 120-146.Marland Kitchen She can no longer afford Januvia.  Past Medical History:  Diagnosis Date  . Depression   . Diabetes mellitus without complication ([place])   . Hyperlipidemia   . Thyroid disease     Past Surgical History:  Procedure Laterality Date  . ANKLE FRACTURE SURGERY    . CESAREAN SECTION      Social History   Socioeconomic History  . Marital status: Married    Spouse name: Not on file  . Number of children: Not on file  . Years of education: Not on file  . Highest education level: Not on file  Occupational History  . Not on file  Tobacco Use  . Smoking status: Former Smoker    Packs/day: 1.00    Years: 15.00    Pack years: 15.00    Types: Cigarettes    Quit date: 06/14/1977    Years since quitting: 41.9  . Smokeless tobacco: Never Used  Substance and Sexual Activity  . Alcohol use: No  . Drug use: No  . Sexual activity: Never  Other Topics Concern  . Not on file  Social History Narrative  . Not on file   Social Determinants of Health   Financial Resource Strain:   . Difficulty of Paying Living Expenses: Not on file  Food Insecurity:   . Worried About Charity fundraiser in the Last Year: Not on file  . Ran Out of Food in  the Last Year: Not on file  Transportation Needs:   . Lack of Transportation (Medical): Not on file  . Lack of Transportation (Non-Medical): Not on file  Physical Activity:   . Days of Exercise per Week: Not on file  . Minutes of Exercise per Session: Not on file  Stress:   . Feeling of Stress : Not on file  Social Connections:   . Frequency of Communication with Friends and Family: Not on file  . Frequency of Social Gatherings with Friends and Family: Not on file  . Attends Religious Services: Not on file  . Active Member of Clubs or Organizations: Not on file  . Attends Archivist Meetings: Not on file  . Marital Status: Not on file  Intimate Partner Violence:   . Fear of Current or Ex-Partner: Not on file  . Emotionally Abused: Not on file  . Physically Abused: Not on file  . Sexually Abused: Not on file    Current Outpatient Medications on File Prior to Visit  Medication Sig Dispense Refill  . Cholecalciferol (VITAMIN D3) 5000 units CAPS Take 1 capsule by mouth daily.    . citalopram (CELEXA) 10 MG tablet Take 2 tablets (20 mg total) by mouth daily for 30 days, THEN 1 tablet (  10 mg total) daily for 30 days, THEN 0.5 tablets (5 mg total) daily. 110 tablet 0  . esomeprazole (NEXIUM) 20 MG capsule Take 20 mg by mouth daily at 12 noon.    . fexofenadine (ALLEGRA) 180 MG tablet Take 1 tablet (180 mg total) by mouth daily. 90 tablet 3  . fluticasone (FLONASE) 50 MCG/ACT nasal spray Do 1 spray each nostril twice daily after your sinus rinses 16 g 2  . levothyroxine (SYNTHROID) 75 MCG tablet TAKE 1 TABLET BY MOUTH DAILY BEFORE BREAKFAST. 30 tablet 5  . Lysine 1000 MG TABS Take 1 tablet by mouth daily.    Marland Kitchen omeprazole (PRILOSEC) 20 MG capsule TAKE 1 CAPSULE BY MOUTH 2 TIMES A DAY BEFORE A MEAL 60 capsule 0  . rosuvastatin (CRESTOR) 20 MG tablet Take 1/2 tab every morning 45 tablet 0   No current facility-administered medications on file prior to visit.    Allergies    Allergen Reactions  . Codeine Nausea And Vomiting  . Metformin And Related Diarrhea    Family History  Problem Relation Age of Onset  . Diabetes Mother   . Hypertension Mother   . Thyroid disease Mother   . Depression Mother   . COPD Sister   . Arthritis Sister   . Healthy Brother   . Healthy Daughter   . Diabetes Son   . Cancer Sister        squamous cell  . Hypertension Son     BP (!) 158/88 (BP Location: Left Arm, Patient Position: Sitting, Cuff Size: Normal)   Pulse 85   Ht 5\' 6"  (1.676 m)   Wt 168 lb 12.8 oz (76.6 kg)   SpO2 98%   BMI 27.25 kg/m    Review of Systems She denies hypoglycemia    Objective:   Physical Exam VITAL SIGNS:  See vs page GENERAL: no distress Pulses: dorsalis pedis intact bilat.   MSK: no deformity of the feet CV: trace bilat leg edema.  Skin:  no ulcer on the feet.  normal color and temp on the feet. Neuro: sensation is intact to touch on the feet  Lab Results  Component Value Date   CREATININE 0.85 05/01/2019   BUN 17 05/01/2019   NA 139 05/01/2019   K 5.1 05/01/2019   CL 105 05/01/2019   CO2 25 05/01/2019   Lab Results  Component Value Date   HGBA1C 7.9 (H) 05/01/2019       Assessment & Plan:  HTN: is noted today Type 2 DM: well-controlled, but she wants cheaper rx Edema: This limits rx options  Patient Instructions  Your blood pressure is high today.  Please see your primary care provider soon, to have it rechecked.   I have sent a prescription to your pharmacy, to double the repaglinide, and:  Please stop taking the Januvia.  Please come back for a follow-up appointment in 6 weeks.

## 2019-05-30 NOTE — Progress Notes (Signed)
Telehealth office visit note for Amanda Douglas, D.O- at Primary Care at [facility]   I connected with current patient today and verified that I am speaking with the correct person using two identifiers.   . Location of the patient: Home . Location of the provider: Office Only the patient (+/- their family members at pt's discretion) and myself were participating in the encounter - This visit type was conducted due to national recommendations for restrictions regarding the COVID-19 Pandemic (e.g. social distancing) in an effort to limit this patient's exposure and mitigate transmission in our community.  This format is felt to be most appropriate for this patient at this time.   - The patient did not have access to video technology or had technical difficulties with video requiring transitioning to audio format only. - No physical exam could be performed with this format, beyond that communicated to [place] by the patient/ family members as noted.   - Additionally my office staff/ schedulers discussed with the patient that there may be a monetary charge related to this service, depending on their medical insurance.   The patient expressed understanding, and agreed to proceed.       History of Present Illness: Diarrhea, Abdominal Pain, and Constipation   I, [name], am serving as scribe for Dr. [name].  Says she's feeling alright, but has had a recent "bout of diarrhea."  Notes she's had "bouts of diarrhea" before "for a lot of years."    Says in the past, she will take imodium for relief.   "When [these bouts of diarrhea] first started, they would just last for a day or two, and it's been such a long time [since the last episode], I didn't think too much about it."    Says "diarrhea is the main thing, and it gets kind of redundant after a while."  She has been taking imodium and IBguard, and "I think that has really helped me because I have felt better the past  couple of days."  Her current episode is lasting "a little longer this time" and she wonders if there's a medication she can take for it.  Notes during these episodes, she typically has diarrhea, then gets constipated and has gas and bloating.  "When I have diarrhea, I have urgency to go and all that."  Notes "I'm pretty sure it's IBS, because I don't have any pain other than gas; I don't hurt, you know.  But I do have gas, woo-hoo."    Notes her gas is smelly and her only pain comes from gas.  Denies cramping.  Notes "I'll have diarrhea first, and then it will stop.  Then, maybe a day or two before I go to the bathroom again, I'll get a little constipated, but it usually works itself out.  This was the first bout I've had of this in a long time; it's been a long time since I've been bothered by this."  - She is not overly concerned; "I just wanted to know what you thought."    - She has not talked to Gastroenterology about these symptoms yet.   Notes she had a colonoscopy four years ago, and "had these bouts before I ever had [the colonoscopy]."  Notes "[Gastroenterology] never said anything about it."  She had a couple of "very small polyps" found on her colonoscopy.  Confirms that her symptoms are worsened by stress.    She continues on Celexa 20 mg daily.  Per patient, feels her anxiety / depression is under control.    "I don't suffer from bouts of depression or anything like that, but I can feel the stress."   Notes "everything doesn't stress me out, but some things do."  Notes "this election has really stressed me out, and I think that might have something to do with it."   GAD 7 : Generalized Anxiety Score 10/31/2018 10/29/2016  Nervous, Anxious, on Edge 0 0  Control/stop worrying 0 0  Worry too much - different things 0 0  Trouble relaxing 0 0  Restless 0 0  Easily annoyed or irritable 0 0  Afraid - awful might happen 0 0  Total GAD 7 Score 0 0  Anxiety Difficulty Not difficult at  all Not difficult at all    Depression screen [facility] 2/9 05/30/2019 05/01/2019 04/11/2019 10/31/2018 12/05/2017  Decreased Interest 0 0 0 0 0  Down, Depressed, Hopeless 0 0 0 0 1  PHQ - 2 Score 0 0 0 0 1  Altered sleeping 1 0 0 0 1  Tired, decreased energy 1 1 1  0 1  Change in appetite 0 1 1 0 1  Feeling bad or failure about yourself  0 0 0 0 0  Trouble concentrating 0 0 0 0 0  Moving slowly or fidgety/restless 0 0 0 0 0  Suicidal thoughts 0 0 0 0 0  PHQ-9 Score 2 2 2  0 4  Difficult doing work/chores Not difficult at all Not difficult at all Not difficult at all Not difficult at all Somewhat difficult  Some recent data might be hidden      Impression and Recommendations:    1. Gas bloat syndrome   2. Functional diarrhea   3. ? Irritable bowel syndrome, unspecified type   4. Adjustment disorder with mixed anxiety and depressed mood   5. Type 2 diabetes mellitus with complication (HCC)   6. Hypothyroidism, unspecified type       Gas Bloat Syndrome, Functional Diarrhea, ? IBS - Reviewed patient's symptoms at length during appointment today.  Discussed that symptoms may be related to inflammatory bowel diseases, and importance of conservative management to start.  - Reviewed prudent use of medications for relief of symptoms.  - Prescription Bentyl provided today.  - If symptoms do not improve on conservative management, we will consider referral to gastroenterology.   - Patient will let us know if she desires referral sooner.  - Last colonoscopy obtained 9 of 2016. - Emphasized importance of seeking pionted specialty care for ongoing symptoms as discussed.  - Discussed that IBS can be exacerbated by stress.  Encouraged patient to assess whether her symptoms are exacerbated by stressful events.  - Told patient to keep a food journal.  Reviewed that certain foods can cause our GI tract to become more aggravated and upset than others.  - To help maintain a stable mood, encouraged  patient to continue her Celexa as prescribed.  - Told patient to hydrate adequately and engage in regular physical activity, especially a daily formal exercise routine.  - Reviewed the "spokes of the wheel" of mood and health management.  Stressed the importance of ongoing prudent habits, including regular exercise, appropriate sleep hygiene, healthful dietary habits, and prayer/meditation to relax.  - Will continue to monitor.   Recommendations Follow up for chronic care as previously discussed.  - As part of my medical decision making, I reviewed the following data within the [place] History obtained from  pt /family, CMA notes reviewed and incorporated if applicable, Labs reviewed, Radiograph/ tests reviewed if applicable and OV notes from prior OV's with me, as well as other specialists she/he has seen since seeing me last, were all reviewed and used in my medical decision making process today.    - Additionally, discussion had with patient regarding our treatment plan, and their biases/concerns about that plan were used in my medical decision making today.    - The patient agreed with the plan and demonstrated an understanding of the instructions.   No barriers to understanding were identified.     Return for chronic care as previously discussed - in 1 mo with [facility] 3 days prior.    Meds ordered this encounter  Medications  . dicyclomine (BENTYL) 10 MG capsule    Sig: Tid before meals as needed gas/diarhea    Dispense:  90 capsule    Refill:  0    Medications Discontinued During This Encounter  Medication Reason  . omeprazole (PRILOSEC) 20 MG capsule Error     I provided 16 minutes of non face-to-face time during this encounter.  Additional time was spent with charting and coordination of care before and after the actual visit commenced.   Note:  This note was prepared with assistance of Dragon voice recognition software. Occasional wrong-word or  sound-a-like substitutions may have occurred due to the inherent limitations of voice recognition software.  This document serves as a record of services personally performed by Amanda Dance, DO. It was created on her behalf by [name], a trained medical scribe. The creation of this record is based on the scribe's personal observations and the provider's statements to them.   This case required medical decision making of at least moderate complexity. The above documentation has been reviewed to be accurate and was completed by Marjory Sneddon, D.O.     Patient Care Team    Relationship Specialty Notifications Start End  Amanda Dance, DO PCP - General Family Medicine  04/01/16   Margaretmary Bayley, MD Referring Physician Internal Medicine  04/01/16   Gastroenterology, [facility] Physician Gastroenterology  04/19/16    Comment: Patient had endoscopy and colonoscopy earlier in year or late 2016.    Renato Shin, MD Consulting Physician Endocrinology  08/21/16    Comment: Treats patients diabetes and thyroid  Everlene Farrier, MD Consulting Physician Obstetrics and Gynecology  05/09/19      -Vitals obtained; medications/ allergies reconciled;  personal medical, social, Sx etc.histories were updated by CMA, reviewed by me and are reflected in chart   Patient Active Problem List   Diagnosis Date Noted  . Hypertension associated with diabetes ([place]) 03/03/2017  . Hyperlipidemia associated with type 2 diabetes mellitus ([place]) 04/19/2016  . Type 2 diabetes mellitus with complication ([place]) [id number]  . Personal history of noncompliance with medical treatment and regimen 04/19/2016  . Hypertriglyceridemia 05/26/2016  . Adjustment disorder with mixed anxiety and depressed mood 04/19/2016  . Hypothyroidism 04/19/2016  . Overweight (BMI 25.0-29.9) 04/19/2016  . Gastroesophageal reflux disease 04/19/2016  . Environmental and seasonal allergies 03/03/2017  . Genetic  Acquired porokeratosis 03/03/2017  . Basal cell carcinoma (BCC)- chin in 2012 03/03/2017  . Colonoscopy refused 03/03/2017  . Hyperkalemia 05/26/2016  . Vitamin D deficiency 04/19/2016  . Patient refuses to take medication 04/11/2019  . Exertional chest pain 03/15/2017  . Nausea w exertion- relieved with rest 03/15/2017  . Counseling on health promotion and disease prevention 05/26/2016  Current Meds  Medication Sig  . Cholecalciferol (VITAMIN D3) 5000 units CAPS Take 1 capsule by mouth daily.  . citalopram (CELEXA) 10 MG tablet Take 2 tablets (20 mg total) by mouth daily for 30 days, THEN 1 tablet (10 mg total) daily for 30 days, THEN 0.5 tablets (5 mg total) daily.  Marland Kitchen esomeprazole (NEXIUM) 20 MG capsule Take 20 mg by mouth daily at 12 noon.  . fexofenadine (ALLEGRA) 180 MG tablet Take 1 tablet (180 mg total) by mouth daily.  . fluticasone (FLONASE) 50 MCG/ACT nasal spray Do 1 spray each nostril twice daily after your sinus rinses  . levothyroxine (SYNTHROID) 75 MCG tablet TAKE 1 TABLET BY MOUTH DAILY BEFORE BREAKFAST.  Marland Kitchen Lysine 1000 MG TABS Take 1 tablet by mouth daily.  . repaglinide (PRANDIN) 2 MG tablet Take 1 tablet (2 mg total) by mouth 3 (three) times daily before meals.  . rosuvastatin (CRESTOR) 20 MG tablet Take 1/2 tab every morning     Allergies:  Allergies  Allergen Reactions  . Codeine Nausea And Vomiting  . Metformin And Related Diarrhea     ROS:  See above HPI for pertinent positives and negatives   Objective:   Blood pressure (!) 146/68, weight 168 lb (76.2 kg).  (if some vitals are omitted, this means that patient was UNABLE to obtain them even though they were asked to get them prior to OV today.  They were asked to call us at their earliest convenience with these once obtained. )  General: A & O * 3; sounds in no acute distress; in usual state of health.  Skin: Pt confirms warm and dry extremities and pink fingertips HEENT: Pt confirms lips  non-cyanotic Chest: Patient confirms normal chest excursion and movement Respiratory: speaking in full sentences, no conversational dyspnea; patient confirms no use of accessory muscles Psych: insight appears good, mood- appears full

## 2019-06-20 ENCOUNTER — Telehealth: Payer: Self-pay | Admitting: Family Medicine

## 2019-06-20 DIAGNOSIS — Z780 Asymptomatic menopausal state: Secondary | ICD-10-CM

## 2019-06-20 DIAGNOSIS — E2839 Other primary ovarian failure: Secondary | ICD-10-CM

## 2019-06-20 DIAGNOSIS — Z1231 Encounter for screening mammogram for malignant neoplasm of breast: Secondary | ICD-10-CM

## 2019-06-20 NOTE — Telephone Encounter (Signed)
Received  Health Maintenance letter back from [facility] office provider stating pt had not been to them since 2011.Marland Kitchen No DEXA available.  FYI to med asst.  --glh

## 2019-06-20 NOTE — Telephone Encounter (Signed)
Please see below. AS, CMA 

## 2019-06-20 NOTE — Telephone Encounter (Signed)
These health maintenance tests have been ordered as advised below. AS, CMA

## 2019-06-20 NOTE — Telephone Encounter (Signed)
Then we will need to order this on patient and anything else that would have otherwise been done there such as mammogram etc.  She has been on the age of getting Pap smears.  Thank you for putting in those orders so patient's health maintenance can stay up-to-date.  Appreciate it.

## 2019-06-26 ENCOUNTER — Encounter: Payer: Self-pay | Admitting: Family Medicine

## 2019-07-03 ENCOUNTER — Other Ambulatory Visit: Payer: Self-pay

## 2019-07-03 ENCOUNTER — Other Ambulatory Visit: Payer: Medicare PPO

## 2019-07-03 DIAGNOSIS — E1169 Type 2 diabetes mellitus with other specified complication: Secondary | ICD-10-CM

## 2019-07-03 DIAGNOSIS — E785 Hyperlipidemia, unspecified: Secondary | ICD-10-CM | POA: Diagnosis not present

## 2019-07-04 LAB — LIPID PANEL
Chol/HDL Ratio: 3 ratio (ref 0.0–4.4)
Cholesterol, Total: 153 mg/dL (ref 100–199)
HDL: 51 mg/dL (ref >39)
LDL Chol Calc (NIH): 78 mg/dL (ref 0–99)
Triglycerides: 135 mg/dL (ref 0–149)
VLDL Cholesterol Cal: 24 mg/dL (ref 5–40)

## 2019-07-04 LAB — ALT: ALT: 17 IU/L (ref 0–32)

## 2019-07-18 ENCOUNTER — Other Ambulatory Visit: Payer: Self-pay

## 2019-07-18 ENCOUNTER — Ambulatory Visit: Payer: Medicare PPO | Admitting: Endocrinology

## 2019-07-18 ENCOUNTER — Encounter: Payer: Self-pay | Admitting: Endocrinology

## 2019-07-18 VITALS — BP 140/70 | HR 82 | Ht 66.0 in | Wt 170.8 lb

## 2019-07-18 DIAGNOSIS — E118 Type 2 diabetes mellitus with unspecified complications: Secondary | ICD-10-CM | POA: Diagnosis not present

## 2019-07-18 LAB — POCT GLYCOSYLATED HEMOGLOBIN (HGB A1C): Hemoglobin A1C: 7.8 % — AB (ref 4.0–5.6)

## 2019-07-18 LAB — T4, FREE: Free T4: 0.9 ng/dL (ref 0.60–1.60)

## 2019-07-18 LAB — TSH: TSH: 2.19 u[IU]/mL (ref 0.35–4.50)

## 2019-07-18 NOTE — Progress Notes (Signed)
Subjective:    Patient ID: Amanda Douglas, female    DOB: [birth year], 74 y.o.   MRN: [id number]  HPI Pt has chronic primary hypothyroidism (dx'ed 2010; she has been on prescribed thyroid hormone therapy since then; she has variable TFT--prob due to variable autoimmunity). Since on 75 mcg/d, pt states she feels well in general, except for fatigue. Pt also returns for f/u of diabetes mellitus:  DM type: 2 Dx'ed: [id number] Complications: none.   Therapy: 2 oral meds.  GDM: never DKA: never Severe hypoglycemia: never.   Pancreatitis: never Pancreatic imaging: normal on 2008 CT SDOH: she declines name brand meds.   Other: she has never taken insulin; she did not tolerate metformin-XR (diarrrhea).   Interval history: pt states she feels well in general.  She says cbg's are in the 100's.   Past Medical History:  Diagnosis Date  . Depression   . Diabetes mellitus without complication ([place])   . Hyperlipidemia   . Thyroid disease     Past Surgical History:  Procedure Laterality Date  . ANKLE FRACTURE SURGERY    . CESAREAN SECTION      Social History   Socioeconomic History  . Marital status: Married    Spouse name: Not on file  . Number of children: Not on file  . Years of education: Not on file  . Highest education level: Not on file  Occupational History  . Not on file  Tobacco Use  . Smoking status: Former Smoker    Packs/day: 1.00    Years: 15.00    Pack years: 15.00    Types: Cigarettes    Quit date: 06/14/1977    Years since quitting: 42.1  . Smokeless tobacco: Never Used  Substance and Sexual Activity  . Alcohol use: No  . Drug use: No  . Sexual activity: Never  Other Topics Concern  . Not on file  Social History Narrative  . Not on file   Social Determinants of Health   Financial Resource Strain:   . Difficulty of Paying Living Expenses: Not on file  Food Insecurity:   . Worried About Charity fundraiser in the Last Year: Not on file  . Ran Out of Food in  the Last Year: Not on file  Transportation Needs:   . Lack of Transportation (Medical): Not on file  . Lack of Transportation (Non-Medical): Not on file  Physical Activity:   . Days of Exercise per Week: Not on file  . Minutes of Exercise per Session: Not on file  Stress:   . Feeling of Stress : Not on file  Social Connections:   . Frequency of Communication with Friends and Family: Not on file  . Frequency of Social Gatherings with Friends and Family: Not on file  . Attends Religious Services: Not on file  . Active Member of Clubs or Organizations: Not on file  . Attends Archivist Meetings: Not on file  . Marital Status: Not on file  Intimate Partner Violence:   . Fear of Current or Ex-Partner: Not on file  . Emotionally Abused: Not on file  . Physically Abused: Not on file  . Sexually Abused: Not on file    Current Outpatient Medications on File Prior to Visit  Medication Sig Dispense Refill  . Cholecalciferol (VITAMIN D3) 5000 units CAPS Take 1 capsule by mouth daily.    Marland Kitchen dicyclomine (BENTYL) 10 MG capsule Tid before meals as needed gas/diarhea 90 capsule 0  . esomeprazole (  NEXIUM) 20 MG capsule Take 20 mg by mouth daily at 12 noon.    . fexofenadine (ALLEGRA) 180 MG tablet Take 1 tablet (180 mg total) by mouth daily. 90 tablet 3  . fluticasone (FLONASE) 50 MCG/ACT nasal spray Do 1 spray each nostril twice daily after your sinus rinses 16 g 2  . levothyroxine (SYNTHROID) 75 MCG tablet TAKE 1 TABLET BY MOUTH DAILY BEFORE BREAKFAST. 30 tablet 5  . Lysine 1000 MG TABS Take 1 tablet by mouth daily.    . repaglinide (PRANDIN) 2 MG tablet Take 1 tablet (2 mg total) by mouth 3 (three) times daily before meals. 90 tablet 11  . rosuvastatin (CRESTOR) 20 MG tablet Take 1/2 tab every morning 45 tablet 0  . citalopram (CELEXA) 10 MG tablet Take 2 tablets (20 mg total) by mouth daily for 30 days, THEN 1 tablet (10 mg total) daily for 30 days, THEN 0.5 tablets (5 mg total) daily.  110 tablet 0   No current facility-administered medications on file prior to visit.    Allergies  Allergen Reactions  . Codeine Nausea And Vomiting  . Metformin And Related Diarrhea    Family History  Problem Relation Age of Onset  . Diabetes Mother   . Hypertension Mother   . Thyroid disease Mother   . Depression Mother   . COPD Sister   . Arthritis Sister   . Healthy Brother   . Healthy Daughter   . Diabetes Son   . Cancer Sister        squamous cell  . Hypertension Son     BP 140/70 (BP Location: Right Arm, Patient Position: Sitting, Cuff Size: Large)   Pulse 82   Ht 5\' 6"  (1.676 m)   Wt 170 lb 12.8 oz (77.5 kg)   SpO2 98%   BMI 27.57 kg/m    Review of Systems She denies hypoglycemia    Objective:   Physical Exam VITAL SIGNS:  See vs page GENERAL: no distress Pulses: dorsalis pedis intact bilat.   MSK: no deformity of the feet CV: no leg edema Skin:  no ulcer on the feet.  normal color and temp on the feet. Neuro: sensation is intact to touch on the feet   Lab Results  Component Value Date   HGBA1C 7.8 (A) 07/18/2019   Lab Results  Component Value Date   CREATININE 0.85 05/01/2019   BUN 17 05/01/2019   NA 139 05/01/2019   K 5.1 05/01/2019   CL 105 05/01/2019   CO2 25 05/01/2019   Lab Results  Component Value Date   TSH 2.19 07/18/2019       Assessment & Plan:  HTN: is noted today Hypothyroidism: well-replaced.  Type 2 DM: well-controlled  Patient Instructions  Your blood pressure is high today.  Please see your primary care provider soon, to have it rechecked.   Please continue the same repaglinide.   Blood tests are requested for you today.  We'll let you know about the results.  If ever you have fever while taking methimazole, stop it and call us, even if the reason is obvious, because of the risk of a rare side-effect. It is best to never miss the methimazole.  However, if you do miss it, next best is to double up the next time.    Please come back for a follow-up appointment in 4 months.

## 2019-07-18 NOTE — Patient Instructions (Addendum)
Your blood pressure is high today.  Please see your primary care provider soon, to have it rechecked.   Please continue the same repaglinide.   Blood tests are requested for you today.  We'll let you know about the results.  If ever you have fever while taking methimazole, stop it and call us, even if the reason is obvious, because of the risk of a rare side-effect. It is best to never miss the methimazole.  However, if you do miss it, next best is to double up the next time.   Please come back for a follow-up appointment in 4 months.

## 2019-07-19 ENCOUNTER — Telehealth: Payer: Self-pay

## 2019-07-19 NOTE — Telephone Encounter (Signed)
-----   Message from Renato Shin, MD sent at 07/18/2019  5:51 PM EST ----- please contact patient: Thyroid is normal.  Please continue the same medication.  I'll see you next time.

## 2019-07-19 NOTE — Telephone Encounter (Signed)
Lab results reviewed by Dr. Ellison. Letter has been mailed. For future reference, letter can be found in Epic. 

## 2019-08-14 ENCOUNTER — Telehealth: Payer: Self-pay | Admitting: Family Medicine

## 2019-08-14 ENCOUNTER — Other Ambulatory Visit: Payer: Self-pay | Admitting: Family Medicine

## 2019-08-14 DIAGNOSIS — F4323 Adjustment disorder with mixed anxiety and depressed mood: Secondary | ICD-10-CM

## 2019-08-14 MED ORDER — CITALOPRAM HYDROBROMIDE 10 MG PO TABS: 10.0000 mg | ORAL_TABLET | Freq: Every day | ORAL | 0 refills | Status: DC

## 2019-08-14 NOTE — Addendum Note (Signed)
Addended by: Mickel Crow on: 08/14/2019 10:30 AM   Modules accepted: Orders

## 2019-08-14 NOTE — Telephone Encounter (Signed)
Please send in prescription for citalopram 10 mg tabs.   Sig: Patient will take 1/2 tablet daily for 2 weeks then increase to 1 tablet daily. -Dispense 60 no refill. -Please notify patient she will need office visit to discuss mood management and how this new treatment plan is going prior to refills.    Thank you very much

## 2019-08-14 NOTE — Telephone Encounter (Signed)
Patient is on Celexa but stopped taking it, and she realizes this was a mistake and wants to continue on this med. She is in need of a refill of this med, if approved please send to [facility].

## 2019-08-14 NOTE — Telephone Encounter (Signed)
Patient was last seen for Medicare wellness 05/01/2019. (Apt in December for acute issue)   citalopram (CELEXA) 10 MG tablet [id number]  ENDED   Order Details Dose, Route, Frequency: As Directed  Dispense Quantity: 110 tablet Refills: 0        Sig: Take 2 tablets (20 mg total) by mouth daily for 30 days, THEN 1 tablet (10 mg total) daily for 30 days, THEN 0.5 tablets (5 mg total) daily.       Start Date: 04/11/19 End Date: 07/10/19 after 90 doses  Written Date: 04/11/19 Expiration Date: 04/10/20     Diagnosis Association: Adjustment disorder with mixed anxiety and depressed mood (F43.23)  Original Order:  citalopram (CELEXA) 40 MG tablet [id number]  Providers  Authorizing Provider: [name], DO NPI: [id number]   DEA #: [id number]  Ordering User:  [name],      Patient previously taking Celexa. Patient had d/ced and is now requesting to restart drug.  Schedule apt to discuss? Please advise. AS, CMA

## 2019-08-14 NOTE — Telephone Encounter (Signed)
Medication sent to pharmacy as advised below per Dr. Raliegh Scarlet.   Patient is aware of directions of medication. Call transferred to front desk for scheduling. AS, CMA

## 2019-10-12 ENCOUNTER — Other Ambulatory Visit: Payer: Self-pay

## 2019-10-12 ENCOUNTER — Encounter: Payer: Self-pay | Admitting: Family Medicine

## 2019-10-12 ENCOUNTER — Ambulatory Visit (INDEPENDENT_AMBULATORY_CARE_PROVIDER_SITE_OTHER): Payer: Medicare PPO | Admitting: Family Medicine

## 2019-10-12 VITALS — BP 143/70 | HR 83 | Ht 66.0 in | Wt 168.0 lb

## 2019-10-12 DIAGNOSIS — F4323 Adjustment disorder with mixed anxiety and depressed mood: Secondary | ICD-10-CM | POA: Diagnosis not present

## 2019-10-12 DIAGNOSIS — E559 Vitamin D deficiency, unspecified: Secondary | ICD-10-CM

## 2019-10-12 DIAGNOSIS — I10 Essential (primary) hypertension: Secondary | ICD-10-CM

## 2019-10-12 DIAGNOSIS — Z7189 Other specified counseling: Secondary | ICD-10-CM

## 2019-10-12 DIAGNOSIS — E1159 Type 2 diabetes mellitus with other circulatory complications: Secondary | ICD-10-CM | POA: Diagnosis not present

## 2019-10-12 DIAGNOSIS — Z9119 Patient's noncompliance with other medical treatment and regimen: Secondary | ICD-10-CM | POA: Diagnosis not present

## 2019-10-12 DIAGNOSIS — E785 Hyperlipidemia, unspecified: Secondary | ICD-10-CM | POA: Diagnosis not present

## 2019-10-12 DIAGNOSIS — E1169 Type 2 diabetes mellitus with other specified complication: Secondary | ICD-10-CM

## 2019-10-12 DIAGNOSIS — J3089 Other allergic rhinitis: Secondary | ICD-10-CM | POA: Diagnosis not present

## 2019-10-12 DIAGNOSIS — Z91199 Patient's noncompliance with other medical treatment and regimen due to unspecified reason: Secondary | ICD-10-CM

## 2019-10-12 DIAGNOSIS — E118 Type 2 diabetes mellitus with unspecified complications: Secondary | ICD-10-CM

## 2019-10-12 DIAGNOSIS — I152 Hypertension secondary to endocrine disorders: Secondary | ICD-10-CM

## 2019-10-12 MED ORDER — CITALOPRAM HYDROBROMIDE 10 MG PO TABS: 10.0000 mg | ORAL_TABLET | Freq: Every day | ORAL | 1 refills | Status: DC

## 2019-10-12 MED ORDER — ROSUVASTATIN CALCIUM 20 MG PO TABS: ORAL_TABLET | ORAL | 1 refills | Status: DC

## 2019-10-12 NOTE — Progress Notes (Signed)
Telehealth office visit note for Amanda Douglas, D.O- at Primary Care at [facility]   I connected with current patient today and verified that I am speaking with the correct person   . Location of the patient: Home . Location of the provider: Office - This visit type was conducted due to national recommendations for restrictions regarding the COVID-19 Pandemic (e.g. social distancing) in an effort to limit this patient's exposure and mitigate transmission in our community.    - No physical exam could be performed with this format, beyond that communicated to [place] by the patient/ family members as noted.   - Additionally my office staff/ schedulers were to discuss with the patient that there may be a monetary charge related to this service, depending on their medical insurance.  My understanding is that patient understood and consented to proceed.     _________________________________________________________________________________   History of Present Illness:  I, [name], am serving as scribe for [employer].  - Mood Management Notes that after she got off the Celexa, she had a "rough patch" for a while, but "after I got back on it, I was fine."  Feels her mood has been fine now.  - Vitamin D Supplementation Continues her Vitamin D daily.  - Environmental & Seasonal Allergies Has had a terrible time with allergies this year.  She takes Allegra D, sometimes Claritin D, because the pharmacist "said it wouldn't hurt to change them up."  She also uses Flonase.  HPI:   Diabetes Mellitus: Followed by Dr. Loanne Drilling  Home glucose readings:  Her sugars have been "pretty good."  Says "not where I'd like for them to be."  Notes her A1c with him was 7.8, and they're working on it.   Feels her sugar is "up and down, but I feel like it's better."  Denies big lows or big highs.  - Patient reports good compliance with therapy plan: medication and/or lifestyle modification.   She continues management on Prandin.  - Her denies acute concerns or problems related to treatment plan  - She denies new concerns.  Denies polyuria/polydipsia, hypo/ hyperglycemia symptoms.  Denies new onset of: chest pain, exercise intolerance, shortness of breath, dizziness, visual changes, headache, lower extremity swelling or claudication.   Last A1C in the office was:  Lab Results  Component Value Date   HGBA1C 7.8 (A) 07/18/2019   HGBA1C 7.9 (H) 05/01/2019   HGBA1C 9.1 (A) 02/20/2019   Lab Results  Component Value Date   MICROALBUR 10 08/11/2017   LDLCALC 78 07/03/2019   CREATININE 0.85 05/01/2019   BP Readings from Last 3 Encounters:  10/12/19 (!) 143/70  07/18/19 140/70  05/30/19 (!) 146/68   Wt Readings from Last 3 Encounters:  10/12/19 168 lb (76.2 kg)  07/18/19 170 lb 12.8 oz (77.5 kg)  05/30/19 168 lb (76.2 kg)   HPI:  Hypertension:  -  Her blood pressure at home has been running: 140-something over 70-something.  Feels her blood pressure runs around 140 "not all the time."  Thinks her average is about 143/69-70, with a pulse of 83.  - Patient reports good compliance with medication and/or lifestyle modification  - Her denies acute concerns or problems related to treatment plan  - She denies new onset of: chest pain, exercise intolerance, shortness of breath, dizziness, visual changes, headache, lower extremity swelling or claudication.   Last 3 blood pressure readings in our office are as follows: BP Readings from Last 3  Encounters:  10/12/19 (!) 143/70  07/18/19 140/70  05/30/19 (!) 146/68   Filed Weights   10/12/19 0948  Weight: 168 lb (76.2 kg)    HPI:  Hyperlipidemia:  74 y.o. female here for cholesterol follow-up.   - Patient reports good compliance with treatment plan of:  medication and/ or lifestyle management.  Takes statin nightly at bedtime.   - Patient denies any acute concerns or problems with management plan   - She denies new  onset of: myalgias, arthralgias, increased fatigue more than normal, chest pains, exercise intolerance, shortness of breath, dizziness, visual changes, headache, lower extremity swelling or claudication.   Most recent cholesterol panel was:  Lab Results  Component Value Date   CHOL 153 07/03/2019   HDL 51 07/03/2019   LDLCALC 78 07/03/2019   TRIG 135 07/03/2019   CHOLHDL 3.0 07/03/2019   Hepatic Function Latest Ref Rng & Units 07/03/2019 05/01/2019 11/16/2018  Total Protein 6.0 - 8.5 g/dL - 6.1 6.1  Albumin 3.7 - 4.7 g/dL - 3.9 4.1  AST 0 - 40 IU/L - 13 10  ALT 0 - 32 IU/L '17 12 9  ' Alk Phosphatase 39 - 117 IU/L - 80 85  Total Bilirubin 0.0 - 1.2 mg/dL - 0.4 0.4        GAD 7 : Generalized Anxiety Score 10/31/2018 10/29/2016  Nervous, Anxious, on Edge 0 0  Control/stop worrying 0 0  Worry too much - different things 0 0  Trouble relaxing 0 0  Restless 0 0  Easily annoyed or irritable 0 0  Afraid - awful might happen 0 0  Total GAD 7 Score 0 0  Anxiety Difficulty Not difficult at all Not difficult at all    Depression screen [facility] 2/9 10/12/2019 05/30/2019 05/01/2019 04/11/2019 10/31/2018  Decreased Interest 0 0 0 0 0  Down, Depressed, Hopeless 0 0 0 0 0  PHQ - 2 Score 0 0 0 0 0  Altered sleeping 1 1 0 0 0  Tired, decreased energy '1 1 1 1 ' 0  Change in appetite 1 0 1 1 0  Feeling bad or failure about yourself  0 0 0 0 0  Trouble concentrating 0 0 0 0 0  Moving slowly or fidgety/restless 0 0 0 0 0  Suicidal thoughts 0 0 0 0 0  PHQ-9 Score '3 2 2 2 ' 0  Difficult doing work/chores Not difficult at all Not difficult at all Not difficult at all Not difficult at all Not difficult at all  Some recent data might be hidden      Impression and Recommendations:     1. Type 2 diabetes mellitus with complication (HCC)   2. Hyperlipidemia associated with type 2 diabetes mellitus ([place])   3. Poorly controlled Hypertension associated with diabetes ([place])-  diet controlled, refuses meds     4. Personal history of noncompliance with medical treatment and regimen   5. Vitamin D deficiency   6. Counseling on health promotion and disease prevention   7. Adjustment disorder with mixed anxiety and depressed mood   8. Environmental and seasonal allergies       Adjustment Disorder w/ Mixed Anxiety & Depressed Mood - Stable on current management. - Continue Celexa as established.  See med list. - Patient tolerating medication well without S-E.  - In addition to prescription intervention, reviewed the "spokes of the wheel" of mood and health management.  Stressed the importance of ongoing prudent habits, including regular exercise, appropriate sleep hygiene, healthful dietary  habits, and prayer/meditation to relax.  - Will continue to monitor.   Environmental & Seasonal Allergies - Per patient, allergies worse this year.  - Overall stable on current management.   - Per patient, takes Allegra D, sometimes Claritin D, and Flonase. - Continue treatment plan as established.  See med list.  - For additional control over symptoms and reduction of exposure to allergens, advised the patient to begin using AYR or Neilmed sinus rinses BID followed by flonase BID (one spray to each nostril).  - Will continue to monitor.   Type 2 diabetes mellitus with complication - Patient is followed by Dr. Loanne Drilling of endocrinology.  - A1c most recently is 7.8. - Continue management with Dr. Loanne Drilling as established.  - Counseled patient on pathophysiology of disease and discussed various treatment options, which always includes dietary and lifestyle modification as first line.    - Importance of low carb, heart-healthy diet discussed with patient in addition to regular aerobic exercise of 79mn 5d/week or more.   - Check FBS and 2 hours after the biggest meal of your day.  Keep log and bring in next specialty OV for specialist to review.     - Also told patient if you ever feel poorly, please  check your blood pressure and blood sugar, as one or the other could be the cause of your symptoms.  - We will continue to monitor.   Hyperlipidemia associated with type 2 diabetes mellitus (HCC)  - FLP stable last check three months ago:  Triglycerides = 135, down from 170 five months ago. HDL = 51, up from 48 five months ago. LDL = 78, down from 95 five months ago.  - Pt will continue current treatment regimen.  See med list.  - Prudent dietary changes such as low saturated & trans fat diets for hyperlipidemia and low carb diets for hypertriglyceridemia discussed with patient.    - Encouraged patient to follow AHA guidelines for regular exercise and also engage in weight loss if BMI above 25.   - We will continue to monitor and re-check as discussed.   Poorly Controlled Hypertension associated with diabetes - diet-controlled, refuses meds - Per patient, BP suboptimally controlled at home, averaging 143/70. - Discussed goal BP of under 140/90.  - Recommended beginning a low-dose blood pressure medication today.  Patient declines at this time.  - To help bring her blood pressure down, patient would rather work on diet and exercise. - Advised lifestyle modification including a heart healthy diet low in salt, and increased exercise.  - STRONGLY advised patient to keep a close BP log. - Follow-up in 4 months with BP log for review.  - Will continue to monitor.   Vitamin D deficiency - Continue supplementation as prescribed.  See med list. - Will continue to monitor and re-check as discussed.   Health Counseling & Preventative Maintenance - Advised patient to continue working toward exercising to improve overall mental, physical, and emotional health.    - Encouraged patient to engage in daily physical activity as tolerated, especially a formal exercise routine.  Recommended that the patient eventually strive for at least 150 minutes of moderate cardiovascular activity per  week according to guidelines established by the [facility]   - Health counseling performed.  All questions answered.      - As part of my medical decision making, I reviewed the following data within the [place] obtained from pt /family, CMA notes reviewed and incorporated if  applicable, Labs reviewed, Radiograph/ tests reviewed if applicable and OV notes from prior OV's with me, as well as other specialists she/he has seen since seeing me last, were all reviewed and used in my medical decision making process today.    - Additionally, when appropriate, discussion had with patient regarding our treatment plan, and their biases/concerns about that plan were used in my medical decision making today.    - The patient agreed with the plan and demonstrated an understanding of the instructions.   No barriers to understanding were identified.     - The patient was advised to call back or seek an in-person evaluation if the symptoms worsen or if the condition fails to improve as anticipated.   Return for f/up in 3-4 months with BP log for review, sooner if BP not controlled.     Meds ordered this encounter  Medications  . citalopram (CELEXA) 10 MG tablet    Sig: Take 1 tablet (10 mg total) by mouth daily. 1 tab daily.    Dispense:  90 tablet    Refill:  1  . rosuvastatin (CRESTOR) 20 MG tablet    Sig: Take 1/2 tab every night b-4 bed    Dispense:  45 tablet    Refill:  1    Medications Discontinued During This Encounter  Medication Reason  . rosuvastatin (CRESTOR) 20 MG tablet   . citalopram (CELEXA) 10 MG tablet Reorder       Time spent on visit including pre-visit chart review and post-visit care was 16 minutes.    Note:  This note was prepared with assistance of Dragon voice recognition software. Occasional wrong-word or sound-a-like substitutions may have occurred due to the inherent limitations of voice recognition software.  The [place]  was signed into law in 2016 which includes the topic of electronic health records.  This provides immediate access to information in MyChart.  This includes consultation notes, operative notes, office notes, lab results and pathology reports.  If you have any questions about what you read please let us know at your next visit or call us at the office.  We are right here with you.  This document serves as a record of services personally performed by Amanda Dance, DO. It was created on her behalf by [name], a trained medical scribe. The creation of this record is based on the scribe's personal observations and the provider's statements to them.    The above documentation from [name], medical scribe, has been reviewed by Marjory Sneddon, D.O.  __________________________________________________________________________________     Patient Care Team    Relationship Specialty Notifications Start End  Lorrene Reid, PA-C PCP - General   10/14/19   Margaretmary Bayley, MD Referring Physician Internal Medicine  04/01/16   Gastroenterology, [facility] Physician Gastroenterology  04/19/16    Comment: Patient had endoscopy and colonoscopy earlier in year or late 2016.    Renato Shin, MD Consulting Physician Endocrinology  08/21/16    Comment: Treats patients diabetes and thyroid  Everlene Farrier, MD Consulting Physician Obstetrics and Gynecology  05/09/19      -Vitals obtained; medications/ allergies reconciled;  personal medical, social, Sx etc.histories were updated by CMA, reviewed by me and are reflected in chart   Patient Active Problem List   Diagnosis Date Noted  . Hypertension associated with diabetes ([place]) 03/03/2017  . Hyperlipidemia associated with type 2 diabetes mellitus ([place]) 04/19/2016  . Type 2 diabetes mellitus with complication (HCC)  04/19/2016  . Personal history of noncompliance with medical treatment and regimen 04/19/2016  .  Hypertriglyceridemia 05/26/2016  . Adjustment disorder with mixed anxiety and depressed mood 04/19/2016  . Hypothyroidism 04/19/2016  . Overweight (BMI 25.0-29.9) 04/19/2016  . Gastroesophageal reflux disease 04/19/2016  . Environmental and seasonal allergies 03/03/2017  . 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 Basal cell carcinoma (BCC)- chin in 2012 03/03/2017  . Colonoscopy refused 03/03/2017  . Hyperkalemia 05/26/2016  . Vitamin D deficiency 04/19/2016  . Patient refuses to take medication 04/11/2019  . Exertional chest pain 03/15/2017  . Nausea w exertion- relieved with rest 03/15/2017  . Counseling on health promotion and disease prevention 05/26/2016     Current Meds  Medication Sig  . Cholecalciferol (VITAMIN D3) 5000 units CAPS Take 1 capsule by mouth daily.  . citalopram (CELEXA) 10 MG tablet Take 1 tablet (10 mg total) by mouth daily. 1 tab daily.  Marland Kitchen dicyclomine (BENTYL) 10 MG capsule Tid before meals as needed gas/diarhea  . esomeprazole (NEXIUM) 20 MG capsule Take 20 mg by mouth daily at 12 noon.  . fexofenadine (ALLEGRA) 180 MG tablet Take 1 tablet (180 mg total) by mouth daily.  . fluticasone (FLONASE) 50 MCG/ACT nasal spray Do 1 spray each nostril twice daily after your sinus rinses  . levothyroxine (SYNTHROID) 75 MCG tablet TAKE 1 TABLET BY MOUTH DAILY BEFORE BREAKFAST.  Marland Kitchen Lysine 1000 MG TABS Take 1 tablet by mouth daily.  . repaglinide (PRANDIN) 2 MG tablet Take 1 tablet (2 mg total) by mouth 3 (three) times daily before meals.  . rosuvastatin (CRESTOR) 20 MG tablet Take 1/2 tab every night b-4 bed  . [DISCONTINUED] citalopram (CELEXA) 10 MG tablet Take 1 tablet (10 mg total) by mouth daily. Take 1/2 tab daily for 2 weeks then increase to 1 tab daily.  . [DISCONTINUED] rosuvastatin (CRESTOR) 20 MG tablet Take 1/2 tab every morning     Allergies:  Allergies  Allergen Reactions  . Codeine Nausea And Vomiting  . Metformin And Related Diarrhea     ROS:    See above HPI for pertinent positives and negatives   Objective:   Blood pressure (!) 143/70, pulse 83, height '5\' 6"'  (1.676 m), weight 168 lb (76.2 kg).  (if some vitals are omitted, this means that patient was UNABLE to obtain them even though they were asked to get them prior to OV today.  They were asked to call us at their earliest convenience with these once obtained. ) General: A & O * 3; sounds in no acute distress; in usual state of health.  Skin: Pt confirms warm and dry extremities and pink fingertips HEENT: Pt confirms lips non-cyanotic Chest: Patient confirms normal chest excursion and movement Respiratory: speaking in full sentences, no conversational dyspnea; patient confirms no use of accessory muscles Psych: insight appears good, mood- appears full

## 2019-11-13 ENCOUNTER — Telehealth: Payer: Self-pay | Admitting: Physician Assistant

## 2019-11-13 ENCOUNTER — Other Ambulatory Visit: Payer: Self-pay

## 2019-11-13 NOTE — Telephone Encounter (Signed)
Pt called to inquire if we have a Doctor yet & if Herb Grays can treat her HTN says her BP has been elevated lately running 151/74 ,160 & her normal BP runs in 120-- advised pt no appts available & due to her concern would highly advise she go to an UC or ED for any immediate medical attention--also forwarding note to med asst to contact pt @ earliest convenience .[phone]  --glh

## 2019-11-14 NOTE — Telephone Encounter (Signed)
Left message on [phone] for patient to return my call.

## 2019-11-15 ENCOUNTER — Ambulatory Visit: Payer: Medicare PPO | Admitting: Endocrinology

## 2019-11-15 ENCOUNTER — Other Ambulatory Visit: Payer: Self-pay

## 2019-11-15 ENCOUNTER — Encounter: Payer: Self-pay | Admitting: Endocrinology

## 2019-11-15 VITALS — BP 120/70 | HR 90 | Ht 66.0 in | Wt 171.4 lb

## 2019-11-15 DIAGNOSIS — E118 Type 2 diabetes mellitus with unspecified complications: Secondary | ICD-10-CM

## 2019-11-15 LAB — POCT GLYCOSYLATED HEMOGLOBIN (HGB A1C): Hemoglobin A1C: 9 % — AB (ref 4.0–5.6)

## 2019-11-15 MED ORDER — COLESEVELAM HCL 625 MG PO TABS: 1250.0000 mg | ORAL_TABLET | Freq: Every day | ORAL | 5 refills | Status: DC

## 2019-11-15 NOTE — Telephone Encounter (Signed)
Spoke with pt who states that she did not go to UC or ED.  She states that her BPs the last couple of days have not been greater than 145/72.  Pt denies chest pain, SOB, dizziness, headaches, visual changes, arm pain or neck pain.  Advised pt to keep appt with Lorrene Reid on 11/27/2019 but that if she should experience any Red Flag symptoms, she needs to be seen at ED immediately.  Pt expressed understanding and is agreeable.  Charyl Bigger, CMA

## 2019-11-15 NOTE — Progress Notes (Signed)
Subjective:    Patient ID: Amanda Douglas, female    DOB: July 11, 1945, 74 y.o.   MRN: [id number]  HPI Pt has chronic primary hypothyroidism (dx'ed 2010; she has been on prescribed thyroid hormone therapy since then; she has variable TFT--prob due to variable autoimmunity). Since on 75 mcg/d, pt states she feels well in general, except for fatigue. Pt also returns for f/u of diabetes mellitus:  DM type: 2 Dx'ed: 99991111 Complications: none.   Therapy: repaglinide.   GDM: never DKA: never Severe hypoglycemia: never.   Pancreatitis: never Pancreatic imaging: normal on 2008 CT SDOH: she declines name brand meds.   Other: she has never taken insulin; she did not tolerate metformin-XR (diarrrhea).   Interval history: pt states she feels well in general.  She says cbg's are in the 100's.  Past Medical History:  Diagnosis Date  . Depression   . Diabetes mellitus without complication ([place])   . Hyperlipidemia   . Thyroid disease     Past Surgical History:  Procedure Laterality Date  . ANKLE FRACTURE SURGERY    . CESAREAN SECTION      Social History   Socioeconomic History  . Marital status: Married    Spouse name: Not on file  . Number of children: Not on file  . Years of education: Not on file  . Highest education level: Not on file  Occupational History  . Not on file  Tobacco Use  . Smoking status: Former Smoker    Packs/day: 1.00    Years: 15.00    Pack years: 15.00    Types: Cigarettes    Quit date: 06/14/1977    Years since quitting: 42.4  . Smokeless tobacco: Never Used  Substance and Sexual Activity  . Alcohol use: No  . Drug use: No  . Sexual activity: Never  Other Topics Concern  . Not on file  Social History Narrative  . Not on file   Social Determinants of Health   Financial Resource Strain:   . Difficulty of Paying Living Expenses:   Food Insecurity:   . Worried About Charity fundraiser in the Last Year:   . Arboriculturist in the Last Year:    Transportation Needs:   . Film/video editor (Medical):   Marland Kitchen Lack of Transportation (Non-Medical):   Physical Activity:   . Days of Exercise per Week:   . Minutes of Exercise per Session:   Stress:   . Feeling of Stress :   Social Connections:   . Frequency of Communication with Friends and Family:   . Frequency of Social Gatherings with Friends and Family:   . Attends Religious Services:   . Active Member of Clubs or Organizations:   . Attends Archivist Meetings:   Marland Kitchen Marital Status:   Intimate Partner Violence:   . Fear of Current or Ex-Partner:   . Emotionally Abused:   Marland Kitchen Physically Abused:   . Sexually Abused:     Current Outpatient Medications on File Prior to Visit  Medication Sig Dispense Refill  . Cholecalciferol (VITAMIN D3) 5000 units CAPS Take 1 capsule by mouth daily.    . citalopram (CELEXA) 10 MG tablet Take 1 tablet (10 mg total) by mouth daily. 1 tab daily. 90 tablet 1  . dicyclomine (BENTYL) 10 MG capsule Tid before meals as needed gas/diarhea 90 capsule 0  . esomeprazole (NEXIUM) 20 MG capsule Take 20 mg by mouth daily at 12 noon.    Marland Kitchen  fexofenadine (ALLEGRA) 180 MG tablet Take 1 tablet (180 mg total) by mouth daily. 90 tablet 3  . fluticasone (FLONASE) 50 MCG/ACT nasal spray Do 1 spray each nostril twice daily after your sinus rinses 16 g 2  . levothyroxine (SYNTHROID) 75 MCG tablet TAKE 1 TABLET BY MOUTH DAILY BEFORE BREAKFAST. 30 tablet 5  . Lysine 1000 MG TABS Take 1 tablet by mouth daily.    . repaglinide (PRANDIN) 2 MG tablet Take 1 tablet (2 mg total) by mouth 3 (three) times daily before meals. 90 tablet 11  . rosuvastatin (CRESTOR) 20 MG tablet Take 1/2 tab every night b-4 bed 45 tablet 1   No current facility-administered medications on file prior to visit.    Allergies  Allergen Reactions  . Codeine Nausea And Vomiting  . Metformin And Related Diarrhea    Family History  Problem Relation Age of Onset  . Diabetes Mother   .  Hypertension Mother   . Thyroid disease Mother   . Depression Mother   . COPD Sister   . Arthritis Sister   . Healthy Brother   . Healthy Daughter   . Diabetes Son   . Cancer Sister        squamous cell  . Hypertension Son     BP 120/70 (BP Location: Left Arm, Patient Position: Sitting, Cuff Size: Normal)   Pulse 90   Ht 5\' 6"  (1.676 m)   Wt 171 lb 6.4 oz (77.7 kg)   SpO2 99%   BMI 27.66 kg/m    Review of Systems She denies hypoglycemia.  She has intermitt nausea.      Objective:   Physical Exam VITAL SIGNS:  See vs page GENERAL: no distress Pulses: dorsalis pedis intact bilat.   MSK: no deformity of the feet CV: trace bilat leg edema Skin:  no ulcer on the feet.  normal color and temp on the feet.  Neuro: sensation is intact to touch on the feet.    Lab Results  Component Value Date   HGBA1C 9.0 (A) 11/15/2019   Lab Results  Component Value Date   TSH 2.19 07/18/2019   Lab Results  Component Value Date   CREATININE 0.85 05/01/2019   BUN 17 05/01/2019   NA 139 05/01/2019   K 5.1 05/01/2019   CL 105 05/01/2019   CO2 25 05/01/2019       Assessment & Plan:  Type 2 DM, worse: she needs insulin.  We discussed.  She is very hesitant.   Edema: This limits rx options.  Patient Instructions  Please continue the same repaglinide.   I have sent a prescription to your pharmacy, to add "colesevelam."  If ever you have fever while taking methimazole, stop it and call us, even if the reason is obvious, because of the risk of a rare side-effect.  It is best to never miss the methimazole.  However, if you do miss it, next best is to double up the next time.   Please see a diabetes educator, to learn about injection insulin from a bottle.  Please come back for a follow-up appointment in late July.

## 2019-11-15 NOTE — Patient Instructions (Addendum)
Please continue the same repaglinide.   I have sent a prescription to your pharmacy, to add "colesevelam."  If ever you have fever while taking methimazole, stop it and call us, even if the reason is obvious, because of the risk of a rare side-effect.  It is best to never miss the methimazole.  However, if you do miss it, next best is to double up the next time.   Please see a diabetes educator, to learn about injection insulin from a bottle.  Please come back for a follow-up appointment in late July.

## 2019-11-19 ENCOUNTER — Telehealth: Payer: Self-pay | Admitting: Endocrinology

## 2019-11-19 ENCOUNTER — Other Ambulatory Visit: Payer: Self-pay

## 2019-11-19 DIAGNOSIS — E039 Hypothyroidism, unspecified: Secondary | ICD-10-CM

## 2019-11-19 MED ORDER — COLESEVELAM HCL 625 MG PO TABS: 1250.0000 mg | ORAL_TABLET | Freq: Every day | ORAL | 5 refills | Status: DC

## 2019-11-19 NOTE — Telephone Encounter (Signed)
RX resent into pharmacy.

## 2019-11-19 NOTE — Telephone Encounter (Signed)
Patient says her pharmacy never received the Rx for the colesevelam And asked if we could re send that.

## 2019-11-21 ENCOUNTER — Telehealth: Payer: Self-pay

## 2019-11-21 NOTE — Telephone Encounter (Signed)
APPROVAL  Medication: [employer]: [employer] PA response: APPROVED Approval response: Approval ends 06/13/20  Documents have been labeled and placed in scan file for HIM and for our future reference.  [facility] (Key: [id number])  This request has been approved.  Please note any additional information provided by [facility] at the bottom of your screen.  [facility] Key: [id number] - PA Case ID: [id number] - Rx #: [id number] Need help? Call us at [phone] Outcome Approvedtoday PA Case: [id number], Status: Approved, Coverage Starts on: 06/15/2019 12:00:00 AM, Coverage Ends on: 06/13/2020 12:00:00 AM. Questions? Contact [phone]. Drug Colesevelam HCl 625MG  tablets Form [employer] Electronic PA Form Original Claim Info [id number] Provide Notice: Medicare Prescription Drug Coverage and Your Rights

## 2019-11-21 NOTE — Telephone Encounter (Signed)
PRIOR AUTHORIZATION  PA initiation date: 11/21/19  Medication: Welchol (colesevelam) Insurance Company: [place] [employer] completed electronically through [employer] My Meds: Yes  Will await insurance response re: approval/denialREKIA Douglas (Key: [id number])  Your information has been sent to [facility]. Amanda Douglas (Key: [id number])  Your information has been submitted to [facility]. [employer] will review the request and will issue a decision, typically within 3-7 days from your submission. You can check the updated outcome later by reopening this request.  If [employer] has not responded in 3-7 days or if you have any questions about your ePA request, please contact [employer] at [phone]. If you think there may be a problem with your PA request, use our live chat feature at the bottom right.  For [place] requests, please call [phone].

## 2019-11-26 ENCOUNTER — Other Ambulatory Visit: Payer: Self-pay | Admitting: Endocrinology

## 2019-11-26 NOTE — Telephone Encounter (Signed)
Last OV 11/15/19 Last fill 05/24/19  #30/5

## 2019-11-27 ENCOUNTER — Other Ambulatory Visit: Payer: Self-pay

## 2019-11-27 ENCOUNTER — Ambulatory Visit (INDEPENDENT_AMBULATORY_CARE_PROVIDER_SITE_OTHER): Payer: Medicare PPO | Admitting: Physician Assistant

## 2019-11-27 ENCOUNTER — Encounter: Payer: Self-pay | Admitting: Physician Assistant

## 2019-11-27 VITALS — BP 149/76 | HR 92 | Temp 97.8°F | Ht 66.0 in | Wt 174.1 lb

## 2019-11-27 DIAGNOSIS — I152 Hypertension secondary to endocrine disorders: Secondary | ICD-10-CM

## 2019-11-27 DIAGNOSIS — E039 Hypothyroidism, unspecified: Secondary | ICD-10-CM | POA: Diagnosis not present

## 2019-11-27 DIAGNOSIS — I10 Essential (primary) hypertension: Secondary | ICD-10-CM | POA: Diagnosis not present

## 2019-11-27 DIAGNOSIS — E118 Type 2 diabetes mellitus with unspecified complications: Secondary | ICD-10-CM

## 2019-11-27 DIAGNOSIS — E1159 Type 2 diabetes mellitus with other circulatory complications: Secondary | ICD-10-CM

## 2019-11-27 NOTE — Patient Instructions (Signed)
DASH Eating Plan DASH stands for "Dietary Approaches to Stop Hypertension." The DASH eating plan is a healthy eating plan that has been shown to reduce high blood pressure (hypertension). It may also reduce your risk for type 2 diabetes, heart disease, and stroke. The DASH eating plan may also help with weight loss. What are tips for following this plan?  General guidelines  Avoid eating more than 2,300 mg (milligrams) of salt (sodium) a day. If you have hypertension, you may need to reduce your sodium intake to 1,500 mg a day.  Limit alcohol intake to no more than 1 drink a day for nonpregnant women and 2 drinks a day for men. One drink equals 12 oz of beer, 5 oz of wine, or 1 oz of hard liquor.  Work with your health care provider to maintain a healthy body weight or to lose weight. Ask what an ideal weight is for you.  Get at least 30 minutes of exercise that causes your heart to beat faster (aerobic exercise) most days of the week. Activities may include walking, swimming, or biking.  Work with your health care provider or diet and nutrition specialist (dietitian) to adjust your eating plan to your individual calorie needs. Reading food labels   Check food labels for the amount of sodium per serving. Choose foods with less than 5 percent of the Daily Value of sodium. Generally, foods with less than 300 mg of sodium per serving fit into this eating plan.  To find whole grains, look for the word "whole" as the first word in the ingredient list. Shopping  Buy products labeled as "low-sodium" or "no salt added."  Buy fresh foods. Avoid canned foods and premade or frozen meals. Cooking  Avoid adding salt when cooking. Use salt-free seasonings or herbs instead of table salt or sea salt. Check with your health care provider or pharmacist before using salt substitutes.  Do not fry foods. Cook foods using healthy methods such as baking, boiling, grilling, and broiling instead.  Cook with  heart-healthy oils, such as olive, canola, soybean, or sunflower oil. Meal planning  Eat a balanced diet that includes: ? 5 or more servings of fruits and vegetables each day. At each meal, try to fill half of your plate with fruits and vegetables. ? Up to 6-8 servings of whole grains each day. ? Less than 6 oz of lean meat, poultry, or fish each day. A 3-oz serving of meat is about the same size as a deck of cards. One egg equals 1 oz. ? 2 servings of low-fat dairy each day. ? A serving of nuts, seeds, or beans 5 times each week. ? Heart-healthy fats. Healthy fats called Omega-3 fatty acids are found in foods such as flaxseeds and coldwater fish, like sardines, salmon, and mackerel.  Limit how much you eat of the following: ? Canned or prepackaged foods. ? Food that is high in trans fat, such as fried foods. ? Food that is high in saturated fat, such as fatty meat. ? Sweets, desserts, sugary drinks, and other foods with added sugar. ? Full-fat dairy products.  Do not salt foods before eating.  Try to eat at least 2 vegetarian meals each week.  Eat more home-cooked food and less restaurant, buffet, and fast food.  When eating at a restaurant, ask that your food be prepared with less salt or no salt, if possible. What foods are recommended? The items listed may not be a complete list. Talk with your dietitian about   what dietary choices are best for you. Grains Whole-grain or whole-wheat bread. Whole-grain or whole-wheat pasta. Brown rice. Oatmeal. Quinoa. Bulgur. Whole-grain and low-sodium cereals. Pita bread. Low-fat, low-sodium crackers. Whole-wheat flour tortillas. Vegetables Fresh or frozen vegetables (raw, steamed, roasted, or grilled). Low-sodium or reduced-sodium tomato and vegetable juice. Low-sodium or reduced-sodium tomato sauce and tomato paste. Low-sodium or reduced-sodium canned vegetables. Fruits All fresh, dried, or frozen fruit. Canned fruit in natural juice (without  added sugar). Meat and other protein foods Skinless chicken or turkey. Ground chicken or turkey. Pork with fat trimmed off. Fish and seafood. Egg whites. Dried beans, peas, or lentils. Unsalted nuts, nut butters, and seeds. Unsalted canned beans. Lean cuts of beef with fat trimmed off. Low-sodium, lean deli meat. Dairy Low-fat (1%) or fat-free (skim) milk. Fat-free, low-fat, or reduced-fat cheeses. Nonfat, low-sodium ricotta or cottage cheese. Low-fat or nonfat yogurt. Low-fat, low-sodium cheese. Fats and oils Soft margarine without trans fats. Vegetable oil. Low-fat, reduced-fat, or light mayonnaise and salad dressings (reduced-sodium). Canola, safflower, olive, soybean, and sunflower oils. Avocado. Seasoning and other foods Herbs. Spices. Seasoning mixes without salt. Unsalted popcorn and pretzels. Fat-free sweets. What foods are not recommended? The items listed may not be a complete list. Talk with your dietitian about what dietary choices are best for you. Grains Baked goods made with fat, such as croissants, muffins, or some breads. Dry pasta or rice meal packs. Vegetables Creamed or fried vegetables. Vegetables in a cheese sauce. Regular canned vegetables (not low-sodium or reduced-sodium). Regular canned tomato sauce and paste (not low-sodium or reduced-sodium). Regular tomato and vegetable juice (not low-sodium or reduced-sodium). Pickles. Olives. Fruits Canned fruit in a light or heavy syrup. Fried fruit. Fruit in cream or butter sauce. Meat and other protein foods Fatty cuts of meat. Ribs. Fried meat. Bacon. Sausage. Bologna and other processed lunch meats. Salami. Fatback. Hotdogs. Bratwurst. Salted nuts and seeds. Canned beans with added salt. Canned or smoked fish. Whole eggs or egg yolks. Chicken or turkey with skin. Dairy Whole or 2% milk, cream, and half-and-half. Whole or full-fat cream cheese. Whole-fat or sweetened yogurt. Full-fat cheese. Nondairy creamers. Whipped toppings.  Processed cheese and cheese spreads. Fats and oils Butter. Stick margarine. Lard. Shortening. Ghee. Bacon fat. Tropical oils, such as coconut, palm kernel, or palm oil. Seasoning and other foods Salted popcorn and pretzels. Onion salt, garlic salt, seasoned salt, table salt, and sea salt. Worcestershire sauce. Tartar sauce. Barbecue sauce. Teriyaki sauce. Soy sauce, including reduced-sodium. Steak sauce. Canned and packaged gravies. Fish sauce. Oyster sauce. Cocktail sauce. Horseradish that you find on the shelf. Ketchup. Mustard. Meat flavorings and tenderizers. Bouillon cubes. Hot sauce and Tabasco sauce. Premade or packaged marinades. Premade or packaged taco seasonings. Relishes. Regular salad dressings. Where to find more information:  National Heart, Lung, and Blood Institute: www.nhlbi.nih.gov  American Heart Association: www.heart.org Summary  The DASH eating plan is a healthy eating plan that has been shown to reduce high blood pressure (hypertension). It may also reduce your risk for type 2 diabetes, heart disease, and stroke.  With the DASH eating plan, you should limit salt (sodium) intake to 2,300 mg a day. If you have hypertension, you may need to reduce your sodium intake to 1,500 mg a day.  When on the DASH eating plan, aim to eat more fresh fruits and vegetables, whole grains, lean proteins, low-fat dairy, and heart-healthy fats.  Work with your health care provider or diet and nutrition specialist (dietitian) to adjust your eating plan to your   individual calorie needs. This information is not intended to replace advice given to you by your health care provider. Make sure you discuss any questions you have with your health care provider. Document Revised: 05/13/2017 Document Reviewed: 05/24/2016 Elsevier Patient Education  2020 Elsevier Inc.  

## 2019-11-27 NOTE — Progress Notes (Signed)
Established Patient Office Visit  Subjective:  Patient ID: Amanda Douglas, female    DOB: 1945-12-07  Age: 74 y.o. MRN: [id number]  CC:  Chief Complaint  Patient presents with   Hypertension    HPI Amanda Douglas presents for follow-up on hypertension.   HTN: Pt denies chest pain, palpitations, dizziness or lower extremity swelling swelling. Not currently on antihypertensive medication and is trying to control blood pressure with diet. Checks BP at home with a wrist cuff and readings range in 140-150/70-80. Pt follows a low salt diet.  Diabetes: Pt is followed by endocrinology. Denies increased urination or thirst. Pt reports she was recently started on a new medication, whelchol. No hypoglycemic events. Checking glucose at home.  She has a diabetic nutrition consult scheduled.  Patient is inquiring about PCP to resume diabetes management.  Hypothyroid. Stable. Reports compliance with levothyroxine 75 mcg.  Past Medical History:  Diagnosis Date   Depression    Diabetes mellitus without complication ([place])    Hyperlipidemia    Thyroid disease     Past Surgical History:  Procedure Laterality Date   ANKLE FRACTURE SURGERY     CESAREAN SECTION      Family History  Problem Relation Age of Onset   Diabetes Mother    Hypertension Mother    Thyroid disease Mother    Depression Mother    COPD Sister    Arthritis Sister    Healthy Brother    Healthy Daughter    Diabetes Son    Cancer Sister        squamous cell   Hypertension Son     Social History   Socioeconomic History   Marital status: Married    Spouse name: Not on file   Number of children: Not on file   Years of education: Not on file   Highest education level: Not on file  Occupational History   Not on file  Tobacco Use   Smoking status: Former Smoker    Packs/day: 1.00    Years: 15.00    Pack years: 15.00    Types: Cigarettes    Quit date: 06/14/1977    Years since quitting:  42.5   Smokeless tobacco: Never Used  Vaping Use   Vaping Use: Never used  Substance and Sexual Activity   Alcohol use: No   Drug use: No   Sexual activity: Never  Other Topics Concern   Not on file  Social History Narrative   Not on file   Social Determinants of Health   Financial Resource Strain:    Difficulty of Paying Living Expenses:   Food Insecurity:    Worried About Charity fundraiser in the Last Year:    Arboriculturist in the Last Year:   Transportation Needs:    Film/video editor (Medical):    Lack of Transportation (Non-Medical):   Physical Activity:    Days of Exercise per Week:    Minutes of Exercise per Session:   Stress:    Feeling of Stress :   Social Connections:    Frequency of Communication with Friends and Family:    Frequency of Social Gatherings with Friends and Family:    Attends Religious Services:    Active Member of Clubs or Organizations:    Attends Archivist Meetings:    Marital Status:   Intimate Partner Violence:    Fear of Current or Ex-Partner:    Emotionally Abused:    Physically  Abused:    Sexually Abused:     Outpatient Medications Prior to Visit  Medication Sig Dispense Refill   Cholecalciferol (VITAMIN D3) 5000 units CAPS Take 1 capsule by mouth daily.     citalopram (CELEXA) 10 MG tablet Take 1 tablet (10 mg total) by mouth daily. 1 tab daily. 90 tablet 1   colesevelam (WELCHOL) 625 MG tablet Take 2 tablets (1,250 mg total) by mouth daily. 60 tablet 5   esomeprazole (NEXIUM) 20 MG capsule Take 20 mg by mouth daily at 12 noon.     fexofenadine (ALLEGRA) 180 MG tablet Take 1 tablet (180 mg total) by mouth daily. 90 tablet 3   fluticasone (FLONASE) 50 MCG/ACT nasal spray Do 1 spray each nostril twice daily after your sinus rinses 16 g 2   levothyroxine (SYNTHROID) 75 MCG tablet TAKE 1 TABLET BY MOUTH DAILY BEFORE BREAKFAST. 30 tablet 5   Lysine 1000 MG TABS Take 1 tablet by mouth  daily.     rosuvastatin (CRESTOR) 20 MG tablet Take 1/2 tab every night b-4 bed 45 tablet 1   dicyclomine (BENTYL) 10 MG capsule Tid before meals as needed gas/diarhea (Patient not taking: Reported on 11/27/2019) 90 capsule 0   repaglinide (PRANDIN) 2 MG tablet Take 1 tablet (2 mg total) by mouth 3 (three) times daily before meals. (Patient not taking: Reported on 11/27/2019) 90 tablet 11   No facility-administered medications prior to visit.    Allergies  Allergen Reactions   Codeine Nausea And Vomiting   Metformin And Related Diarrhea    ROS Review of Systems  A fourteen system review of systems was performed and found to be positive as per HPI.    Objective:    Physical Exam General: Well nourished, in no apparent distress. Eyes: PERRLA, EOMs, conjunctiva clr Resp: Respiratory effort- normal, ECTA B/L w/o W/R/R  Cardio: RRR w/o MRGs. Abdomen: no gross distention. Lymphatics:  less 2 sec cap RF M-sk: Full ROM, 5/5 strength, normal gait.  Skin: Warm, dry  Neuro: Alert, Oriented Psych: Normal affect, Insight and Judgment appropriate.   BP (!) 149/76    Pulse 92    Temp 97.8 F (36.6 C) (Oral)    Ht 5\' 6"  (1.676 m)    Wt 174 lb 1.6 oz (79 kg)    SpO2 98%    BMI 28.10 kg/m  Wt Readings from Last 3 Encounters:  11/27/19 174 lb 1.6 oz (79 kg)  11/15/19 171 lb 6.4 oz (77.7 kg)  10/12/19 168 lb (76.2 kg)     Health Maintenance Due  Topic Date Due   COVID-19 Vaccine (1) Never done   TETANUS/TDAP  Never done   DEXA SCAN  Never done   PNA vac Low Risk Adult (1 of 2 - PCV13) Never done   OPHTHALMOLOGY EXAM  12/04/2019    There are no preventive care reminders to display for this patient.  Lab Results  Component Value Date   TSH 2.19 07/18/2019   Lab Results  Component Value Date   WBC 6.3 11/16/2018   HGB 13.1 11/16/2018   HCT 39.1 11/16/2018   MCV 86 11/16/2018   PLT 285 11/16/2018   Lab Results  Component Value Date   NA 139 05/01/2019   K 5.1  05/01/2019   CO2 25 05/01/2019   GLUCOSE 178 (H) 05/01/2019   BUN 17 05/01/2019   CREATININE 0.85 05/01/2019   BILITOT 0.4 05/01/2019   ALKPHOS 80 05/01/2019   AST 13 05/01/2019  ALT 17 07/03/2019   PROT 6.1 05/01/2019   ALBUMIN 3.9 05/01/2019   CALCIUM 9.1 05/01/2019   Lab Results  Component Value Date   CHOL 153 07/03/2019   Lab Results  Component Value Date   HDL 51 07/03/2019   Lab Results  Component Value Date   LDLCALC 78 07/03/2019   Lab Results  Component Value Date   TRIG 135 07/03/2019   Lab Results  Component Value Date   CHOLHDL 3.0 07/03/2019   Lab Results  Component Value Date   HGBA1C 9.0 (A) 11/15/2019      Assessment & Plan:   Problem List Items Addressed This Visit      Cardiovascular and Mediastinum   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 - Discussed with patient to purchase upper arm cuff, and check blood pressure twice daily and keep a log.  Instructed to let me know blood pressure readings in 2 to 3 weeks, and if consistently above 140/90 will start blood pressure medication. Patient verbalized understanding. - Continue DASH diet. - Encourage to stay as active as possible. - Stay well hydrated, at least 64 fl oz   Type 2 Diabetes Mellitus: -Most recent A1c 9.0, uncontrolled.  -Followed by endocrinology. Discussed with patient will review prior office visits and see why Dr. Raliegh Scarlet referred to endocrinology for diabetes management. -Continue Welchol. -Continue ambulatory glucose monitoring and notify clinic if FBS consistently <80 or >160. -Follow low glucose/carbohydrate diet and stay as active as possible.  -Diabetes education therapy is scheduled for 12/12/19.  Hypothyroid: - Stable, asymptomatic.  - Followed by endocrinology. - Last TSH wnl's - Continue Levothyroxine 75 mcg.  No  orders of the defined types were placed in this encounter.   Follow-up: Return in about 3 months (around 02/27/2020) for HTN, DM, HLD.    Lorrene Reid, PA-C

## 2019-12-10 ENCOUNTER — Ambulatory Visit: Payer: Medicare PPO | Admitting: Nutrition

## 2019-12-12 ENCOUNTER — Encounter: Payer: Medicare PPO | Attending: Endocrinology | Admitting: Nutrition

## 2019-12-12 ENCOUNTER — Other Ambulatory Visit: Payer: Self-pay

## 2019-12-12 DIAGNOSIS — E118 Type 2 diabetes mellitus with unspecified complications: Secondary | ICD-10-CM | POA: Insufficient documentation

## 2019-12-12 NOTE — Progress Notes (Signed)
Patient was identified by name and DOB.  She reports that she is here to learn how to take insulin, and is not happy about this.  She reports feeling "very tired all of the time, even appon waking. Does not like to test blood sugar, and takes it rarely, but says it runs around 190-low 200s acB We discussed how insulin works, and how to take the insulin both in the pen and using a vial and syringe.  We discussed cost and symptoms and treatment of low blood sugars as well.  She reported good understanding of all of the above topics and had no final questions. We discussed the importance of testing blood sugar every morning to determine the correct dose of insulin, and the need to notify Dr. Cordelia Pen office if blood sugars drop below 70, or remain over 250, before her return visit next month.  She agreed to do this. We also discussed the possibility of her feeling much better after taking the insulin.  She has agreed to try this, and has asked that I message Dr. Loanne Drilling to call the prescription in for the insulin to [facility].   This note with a message will be forwarded to him with the message.

## 2019-12-12 NOTE — Patient Instructions (Addendum)
Take insulin as directed daily Call if questions Test blood sugar before breakfast every morning, or if feeling symptoms of low blood sugar Call office if blood sugars do not come down, or if above 250, before next visit.

## 2019-12-13 ENCOUNTER — Other Ambulatory Visit: Payer: Self-pay | Admitting: Endocrinology

## 2019-12-13 MED ORDER — LANTUS SOLOSTAR 100 UNIT/ML ~~LOC~~ SOPN
10.0000 [IU] | PEN_INJECTOR | SUBCUTANEOUS | 99 refills | Status: DC
Start: 2019-12-13 — End: 2020-01-10

## 2020-01-10 ENCOUNTER — Encounter: Payer: Self-pay | Admitting: Endocrinology

## 2020-01-10 ENCOUNTER — Other Ambulatory Visit: Payer: Self-pay

## 2020-01-10 ENCOUNTER — Telehealth: Payer: Self-pay

## 2020-01-10 ENCOUNTER — Ambulatory Visit: Payer: Medicare PPO | Admitting: Endocrinology

## 2020-01-10 VITALS — BP 120/78 | HR 95 | Ht 66.0 in | Wt 173.2 lb

## 2020-01-10 DIAGNOSIS — E118 Type 2 diabetes mellitus with unspecified complications: Secondary | ICD-10-CM

## 2020-01-10 LAB — BASIC METABOLIC PANEL
BUN: 21 mg/dL (ref 6–23)
CO2: 28 mEq/L (ref 19–32)
Calcium: 9.8 mg/dL (ref 8.4–10.5)
Chloride: 100 mEq/L (ref 96–112)
Creatinine, Ser: 0.79 mg/dL (ref 0.40–1.20)
GFR: 70.85 mL/min (ref >60.00)
Glucose, Bld: 274 mg/dL — ABNORMAL HIGH (ref 70–99)
Potassium: 4.7 mEq/L (ref 3.5–5.1)
Sodium: 133 mEq/L — ABNORMAL LOW (ref 135–145)

## 2020-01-10 LAB — POCT GLYCOSYLATED HEMOGLOBIN (HGB A1C): Hemoglobin A1C: 10.7 % — AB (ref 4.0–5.6)

## 2020-01-10 LAB — TSH: TSH: 2.56 u[IU]/mL (ref 0.35–4.50)

## 2020-01-10 LAB — T4, FREE: Free T4: 1.11 ng/dL (ref 0.60–1.60)

## 2020-01-10 MED ORDER — INSULIN NPH (HUMAN) (ISOPHANE) 100 UNIT/ML ~~LOC~~ SUSP
25.0000 [IU] | SUBCUTANEOUS | 11 refills | Status: DC
Start: 2020-01-10 — End: 2020-04-14

## 2020-01-10 NOTE — Telephone Encounter (Signed)
-----   Message from Renato Shin, MD sent at 01/10/2020  5:02 PM EDT ----- please contact patient: Aside from the blood sugar, normal results.  I'll see you next time.

## 2020-01-10 NOTE — Progress Notes (Signed)
Subjective:    Patient ID: Amanda Douglas, female    DOB: [birth year], 74 y.o.   MRN: [id number]  HPI Pt has chronic primary hypothyroidism (dx'ed 2010; she has been on prescribed thyroid hormone therapy since then; she has variable TFT--prob due to variable autoimmunity). Since on 75 mcg/d, pt states she feels well in general, except for fatigue. Pt also returns for f/u of diabetes mellitus:  DM type: Insulin-requiring type 2 Dx'ed: 2992 Complications: none.   Therapy: insulin since 2021   GDM: never DKA: never Severe hypoglycemia: never.   Pancreatitis: never Pancreatic imaging: normal on 2008 CT SDOH: she declines name brand meds.   Other: she did not tolerate metformin-XR (diarrrhea).   Interval history: pt states she feels well in general.  She says cbg's vary from 140-260.  She stopped 2 oral meds.  She says Lantus is too expensive.   Past Medical History:  Diagnosis Date  . Depression   . Diabetes mellitus without complication ([place])   . Hyperlipidemia   . Thyroid disease     Past Surgical History:  Procedure Laterality Date  . ANKLE FRACTURE SURGERY    . CESAREAN SECTION      Social History   Socioeconomic History  . Marital status: Married    Spouse name: Not on file  . Number of children: Not on file  . Years of education: Not on file  . Highest education level: Not on file  Occupational History  . Not on file  Tobacco Use  . Smoking status: Former Smoker    Packs/day: 1.00    Years: 15.00    Pack years: 15.00    Types: Cigarettes    Quit date: 06/14/1977    Years since quitting: 42.6  . Smokeless tobacco: Never Used  Vaping Use  . Vaping Use: Never used  Substance and Sexual Activity  . Alcohol use: No  . Drug use: No  . Sexual activity: Never  Other Topics Concern  . Not on file  Social History Narrative  . Not on file   Social Determinants of Health   Financial Resource Strain:   . Difficulty of Paying Living Expenses:   Food Insecurity:    . Worried About Charity fundraiser in the Last Year:   . Arboriculturist in the Last Year:   Transportation Needs:   . Film/video editor (Medical):   Marland Kitchen Lack of Transportation (Non-Medical):   Physical Activity:   . Days of Exercise per Week:   . Minutes of Exercise per Session:   Stress:   . Feeling of Stress :   Social Connections:   . Frequency of Communication with Friends and Family:   . Frequency of Social Gatherings with Friends and Family:   . Attends Religious Services:   . Active Member of Clubs or Organizations:   . Attends Archivist Meetings:   Marland Kitchen Marital Status:   Intimate Partner Violence:   . Fear of Current or Ex-Partner:   . Emotionally Abused:   Marland Kitchen Physically Abused:   . Sexually Abused:     Current Outpatient Medications on File Prior to Visit  Medication Sig Dispense Refill  . Cholecalciferol (VITAMIN D3) 5000 units CAPS Take 1 capsule by mouth daily.    . citalopram (CELEXA) 10 MG tablet Take 1 tablet (10 mg total) by mouth daily. 1 tab daily. 90 tablet 1  . esomeprazole (NEXIUM) 20 MG capsule Take 20 mg by mouth daily at  12 noon.    . fexofenadine (ALLEGRA) 180 MG tablet Take 1 tablet (180 mg total) by mouth daily. 90 tablet 3  . fluticasone (FLONASE) 50 MCG/ACT nasal spray Do 1 spray each nostril twice daily after your sinus rinses 16 g 2  . levothyroxine (SYNTHROID) 75 MCG tablet TAKE 1 TABLET BY MOUTH DAILY BEFORE BREAKFAST. 30 tablet 5  . Lysine 1000 MG TABS Take 1 tablet by mouth daily.    . rosuvastatin (CRESTOR) 20 MG tablet Take 1/2 tab every night b-4 bed 45 tablet 1   No current facility-administered medications on file prior to visit.    Allergies  Allergen Reactions  . Codeine Nausea And Vomiting  . Metformin And Related Diarrhea    Family History  Problem Relation Age of Onset  . Diabetes Mother   . Hypertension Mother   . Thyroid disease Mother   . Depression Mother   . COPD Sister   . Arthritis Sister   .  Healthy Brother   . Healthy Daughter   . Diabetes Son   . Cancer Sister        squamous cell  . Hypertension Son     BP 120/78   Pulse 95   Ht 5\' 6"  (1.676 m)   Wt 173 lb 3.2 oz (78.6 kg)   SpO2 97%   BMI 27.96 kg/m    Review of Systems She denies hypoglycemia.      Objective:   Physical Exam VITAL SIGNS:  See vs page GENERAL: no distress Pulses: dorsalis pedis intact bilat.   MSK: no deformity of the feet CV: no leg edema.   Skin:  no ulcer on the feet.  normal color and temp on the feet.  Neuro: sensation is intact to touch on the feet.    Lab Results  Component Value Date   HGBA1C 10.7 (A) 01/10/2020       Assessment & Plan:  Insulin-requiring type 2 DM: worse SDOH: she needs the cheapest possible meds  Patient Instructions  I have sent a prescription to your pharmacy, to change the insulin to NPH On this type of insulin schedule, you should eat meals on a regular schedule.  If a meal is missed or significantly delayed, your blood sugar could go low.  Blood tests are requested for you today.  We'll let you know about the results.  If ever you have fever while taking methimazole, stop it and call us, even if the reason is obvious, because of the risk of a rare side-effect.  It is best to never miss the methimazole.  However, if you do miss it, next best is to double up the next time.   Please come back for a follow-up appointment in 2 months.

## 2020-01-10 NOTE — Telephone Encounter (Signed)
RESULTS  Results were reviewed by Dr. Ellison. A letter has been mailed to pt home address. For future reference, letter can be found in Epic. 

## 2020-01-10 NOTE — Patient Instructions (Addendum)
I have sent a prescription to your pharmacy, to change the insulin to NPH On this type of insulin schedule, you should eat meals on a regular schedule.  If a meal is missed or significantly delayed, your blood sugar could go low.  Blood tests are requested for you today.  We'll let you know about the results.  If ever you have fever while taking methimazole, stop it and call us, even if the reason is obvious, because of the risk of a rare side-effect.  It is best to never miss the methimazole.  However, if you do miss it, next best is to double up the next time.   Please come back for a follow-up appointment in 2 months.

## 2020-01-24 ENCOUNTER — Telehealth: Payer: Self-pay | Admitting: Physician Assistant

## 2020-01-24 MED ORDER — FLUCONAZOLE 150 MG PO TABS
150.0000 mg | ORAL_TABLET | Freq: Once | ORAL | 0 refills | Status: AC
Start: 2020-01-24 — End: 2020-01-24

## 2020-01-24 NOTE — Telephone Encounter (Signed)
Patient having vaginal yeast infection for 2 wks now. She has been using OTC medication and it is not helping.   Spoke with Herb Grays who agrees to send in [place] for patient.   Patient verbalized understanding. AS< CMA   [facility]

## 2020-01-24 NOTE — Addendum Note (Signed)
Addended by: Mickel Crow on: 01/24/2020 11:42 AM   Modules accepted: Orders

## 2020-01-24 NOTE — Telephone Encounter (Signed)
Patient called requesting to speak with nurse about a yeast infection, she declined to share details with front staff. Please contact when available

## 2020-02-19 ENCOUNTER — Telehealth: Payer: Self-pay | Admitting: Physician Assistant

## 2020-02-19 NOTE — Telephone Encounter (Signed)
Patient has an appt on the 16th but called saying she has been dealing some allergy issues and wants to speak with the nurse about OTC or prescribed meds that could help. Please advise

## 2020-02-20 NOTE — Telephone Encounter (Signed)
LMTCB. AS, CMA 

## 2020-02-21 NOTE — Telephone Encounter (Signed)
Patient does have diagnosis of environmental and seasonal allergies. Will send Singulair and recommend to continue with a daily allergy medication such as Claritin, Allegra, Zyrtec or Xyzal and do nasal rinses. Recommend to keep follow-up appointment as scheduled to further discuss.  Thank you, Herb Grays

## 2020-02-21 NOTE — Telephone Encounter (Signed)
Patient states she is having a very hard time with allergies this year. She has tried taking several otc allergy meds such as: claritin, Claritin d, zyrtec, etc with no relief  Patient requesting Singular.  Please advise. AS, CMA   [facility]

## 2020-02-22 NOTE — Telephone Encounter (Signed)
Left message for patient to advise below. AS, CMA

## 2020-02-27 ENCOUNTER — Telehealth: Payer: Self-pay | Admitting: Physician Assistant

## 2020-02-27 ENCOUNTER — Other Ambulatory Visit: Payer: Self-pay | Admitting: Physician Assistant

## 2020-02-27 MED ORDER — MONTELUKAST SODIUM 10 MG PO TABS
10.0000 mg | ORAL_TABLET | Freq: Every day | ORAL | 0 refills | Status: DC
Start: 2020-02-27 — End: 2020-06-04

## 2020-02-27 NOTE — Telephone Encounter (Signed)
Medication has been sent to requested pharmacy. AS, CMA

## 2020-02-27 NOTE — Telephone Encounter (Signed)
Patient was advised that she would receive a prescription for Singulair, I do not see where order was placed. Can we send this med to [facility] as she is still having seasonal allergy issues.

## 2020-02-28 ENCOUNTER — Ambulatory Visit (INDEPENDENT_AMBULATORY_CARE_PROVIDER_SITE_OTHER): Payer: Medicare PPO | Admitting: Physician Assistant

## 2020-02-28 ENCOUNTER — Other Ambulatory Visit: Payer: Self-pay

## 2020-02-28 ENCOUNTER — Encounter: Payer: Self-pay | Admitting: Physician Assistant

## 2020-02-28 VITALS — BP 130/66 | Ht 66.0 in | Wt 174.0 lb

## 2020-02-28 DIAGNOSIS — E1169 Type 2 diabetes mellitus with other specified complication: Secondary | ICD-10-CM | POA: Diagnosis not present

## 2020-02-28 DIAGNOSIS — J3089 Other allergic rhinitis: Secondary | ICD-10-CM

## 2020-02-28 DIAGNOSIS — E118 Type 2 diabetes mellitus with unspecified complications: Secondary | ICD-10-CM

## 2020-02-28 DIAGNOSIS — E1159 Type 2 diabetes mellitus with other circulatory complications: Secondary | ICD-10-CM | POA: Diagnosis not present

## 2020-02-28 DIAGNOSIS — E785 Hyperlipidemia, unspecified: Secondary | ICD-10-CM | POA: Diagnosis not present

## 2020-02-28 DIAGNOSIS — I10 Essential (primary) hypertension: Secondary | ICD-10-CM

## 2020-02-28 MED ORDER — ROSUVASTATIN CALCIUM 20 MG PO TABS: 20.0000 mg | ORAL_TABLET | Freq: Every day | ORAL | 1 refills | Status: DC

## 2020-02-28 NOTE — Progress Notes (Signed)
Telehealth office visit note for Amanda Reid, PA-C- at Primary Care at [facility]   I connected with current patient today by telephone and verified that I am speaking with the correct person    Location of the patient: Home  Location of the provider: Office - This visit type was conducted due to national recommendations for restrictions regarding the COVID-19 Pandemic (e.g. social distancing) in an effort to limit this patient's exposure and mitigate transmission in our community.    - No physical exam could be performed with this format, beyond that communicated to [place] by the patient/ family members as noted.   - Additionally my office staff/ schedulers were to discuss with the patient that there may be a monetary charge related to this service, depending on their medical insurance.  My understanding is that patient understood and consented to proceed.     _________________________________________________________________________________   History of Present Illness: Pt calls in to follow up on hypertension, diabetes mellitus, and hyperlipidemia.  HTN: Pt denies chest pain, palpitations, dizziness or leg swelling. States blood pressure is doing better. Checks BP at home and readings range in 130/60s. Pt follows a low salt diet.  Diabetes Mellitus: Pt denies increased urination or thirst. Pt reports medication compliance. Checking glucose at home but hasn't recently.   HLD: Pt taking medication as directed without issues. Denies side effects including myalgias and RUQ pain. Reports she has been taking full tablet of Crestor 20 mg.  Seasonal allergies: Pt reports her daughter takes Singulair to help with seasonal allergies and was interested on trying medication. She takes OTC allergy medication.   Yeast infection: States symptoms resolved with diflucan.     GAD 7 : Generalized Anxiety Score 10/31/2018 10/29/2016  Nervous, Anxious, on Edge 0 0  Control/stop worrying 0 0  Worry  too much - different things 0 0  Trouble relaxing 0 0  Restless 0 0  Easily annoyed or irritable 0 0  Afraid - awful might happen 0 0  Total GAD 7 Score 0 0  Anxiety Difficulty Not difficult at all Not difficult at all    Depression screen [facility] 2/9 02/28/2020 11/27/2019 10/12/2019 05/30/2019 05/01/2019  Decreased Interest 0 0 0 0 0  Down, Depressed, Hopeless 0 0 0 0 0  PHQ - 2 Score 0 0 0 0 0  Altered sleeping 1 3 1 1  0  Tired, decreased energy 1 3 1 1 1   Change in appetite 0 0 1 0 1  Feeling bad or failure about yourself  0 0 0 0 0  Trouble concentrating 0 0 0 0 0  Moving slowly or fidgety/restless 0 0 0 0 0  Suicidal thoughts 0 0 0 0 0  PHQ-9 Score 2 6 3 2 2   Difficult doing work/chores Not difficult at all Not difficult at all Not difficult at all Not difficult at all Not difficult at all  Some recent data might be hidden      Impression and Recommendations:     1. Type 2 diabetes mellitus with complication (HCC)   2. Hypertension associated with diabetes ([place])-  diet controlled, refuses meds   3. Hyperlipidemia associated with type 2 diabetes mellitus ([place])   4. Environmental and seasonal allergies     Type 2 diabetes mellitus with complication: -Continue to follow-up with endocrinology, Dr. Loanne Drilling. -Most recent A1c elevated -Continue current medication regimen. -Recommend to follow a low carbohydrate and glucose diet.  Hypertension associated with diabetes, diet controlled: -BP stable,  asymptomatic -Continue ambulatory BP and pulse monitoring. -Continue low-sodium diet. -Will continue to monitor.  Hyperlipidemia associated with type 2 diabetes mellitus: -Last lipid panel WNL, LDL 78 -Continue Crestor 20 mg: Patient has been taking full tablet and tolerating without issues so advised to continue with full dose. Dose adjusted on med list. -Follow a heart healthy diet and stay as active as possible. -Plan to recheck lipid panel and hepatic function next  OV.  Environmental and seasonal allergies: -Continue with antihistamine medication.  -Patient is not on a beta blocker or warfarin so reasonable to trial Singulair.    - As part of my medical decision making, I reviewed the following data within the [place] History obtained from pt /family, CMA notes reviewed and incorporated if applicable, Labs reviewed, Radiograph/ tests reviewed if applicable and OV notes from prior OV's with me, as well as any other specialists she/he has seen since seeing me last, were all reviewed and used in my medical decision making process today.    - Additionally, when appropriate, discussion had with patient regarding our treatment plan, and their biases/concerns about that plan were used in my medical decision making today.    - The patient agreed with the plan and demonstrated an understanding of the instructions.   No barriers to understanding were identified.     - The patient was advised to call back or seek an in-person evaluation if the symptoms worsen or if the condition fails to improve as anticipated.   Return in about 3 months (around 05/29/2020) for DM, HTN, HLD, Vit D and FBW (include Vit d) few days prior.    No orders of the defined types were placed in this encounter.   No orders of the defined types were placed in this encounter.   There are no discontinued medications.     Time spent on visit including pre-visit chart review and post-visit care was 15 minutes.      The [place] was signed into law in 2016 which includes the topic of electronic health records.  This provides immediate access to information in MyChart.  This includes consultation notes, operative notes, office notes, lab results and pathology reports.  If you have any questions about what you read please let us know at your next visit or call us at the office.  We are right here with you.  Note:  This note was prepared with assistance  of Dragon voice recognition software. Occasional wrong-word or sound-a-like substitutions may have occurred due to the inherent limitations of voice recognition software.  __________________________________________________________________________________     Patient Care Team    Relationship Specialty Notifications Start End  Amanda Douglas, [place] PCP - General   10/14/19   Margaretmary Bayley, MD Referring Physician Internal Medicine  04/01/16   Gastroenterology, [facility] Physician Gastroenterology  04/19/16    Comment: Patient had endoscopy and colonoscopy earlier in year or late 2016.    Renato Shin, MD Consulting Physician Endocrinology  08/21/16    Comment: Treats patients diabetes and thyroid  Everlene Farrier, MD Consulting Physician Obstetrics and Gynecology  05/09/19      -Vitals obtained; medications/ allergies reconciled;  personal medical, social, Sx etc.histories were updated by CMA, reviewed by me and are reflected in chart   Patient Active Problem List   Diagnosis Date Noted   Patient refuses to take medication 04/11/2019   Exertional chest pain 03/15/2017   Nausea w exertion- relieved with rest 03/15/2017  Hypertension associated with diabetes ([place]) 03/03/2017   Environmental and seasonal allergies 03/03/2017   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   Basal cell carcinoma (BCC)- chin in 2012 03/03/2017   Colonoscopy refused 03/03/2017   Hypertriglyceridemia 05/26/2016   Hyperkalemia 05/26/2016   Counseling on health promotion and disease prevention 05/26/2016   Adjustment disorder with mixed anxiety and depressed mood 04/19/2016   Hyperlipidemia associated with type 2 diabetes mellitus ([place]) 04/19/2016   Type 2 diabetes mellitus with complication ([place]) 92/33/0076   Hypothyroidism 04/19/2016   Overweight (BMI 25.0-29.9) 04/19/2016   Personal history of noncompliance with medical treatment and regimen 04/19/2016   Vitamin D deficiency  04/19/2016   Gastroesophageal reflux disease 04/19/2016     Current Meds  Medication Sig   Cholecalciferol (VITAMIN D3) 5000 units CAPS Take 1 capsule by mouth daily.   citalopram (CELEXA) 10 MG tablet Take 1 tablet (10 mg total) by mouth daily. 1 tab daily.   esomeprazole (NEXIUM) 20 MG capsule Take 20 mg by mouth daily at 12 noon.   fluticasone (FLONASE) 50 MCG/ACT nasal spray Do 1 spray each nostril twice daily after your sinus rinses   insulin NPH Human (NOVOLIN N) 100 UNIT/ML injection Inject 0.25 mLs (25 Units total) into the skin every morning. And syringes 1/day   levothyroxine (SYNTHROID) 75 MCG tablet TAKE 1 TABLET BY MOUTH DAILY BEFORE BREAKFAST.   Lysine 1000 MG TABS Take 1 tablet by mouth daily.   montelukast (SINGULAIR) 10 MG tablet Take 1 tablet (10 mg total) by mouth at bedtime.   Multiple Vitamin (MULTIVITAMIN) tablet Take 1 tablet by mouth daily.   rosuvastatin (CRESTOR) 20 MG tablet Take 1/2 tab every night b-4 bed     Allergies:  Allergies  Allergen Reactions   Codeine Nausea And Vomiting   Metformin And Related Diarrhea     ROS:  See above HPI for pertinent positives and negatives   Objective:   Blood pressure 130/66, height 5\' 6"  (1.676 m), weight 174 lb (78.9 kg).  (if some vitals are omitted, this means that patient was UNABLE to obtain them even though they were asked to get them prior to OV today.  They were asked to call us at their earliest convenience with these once obtained. ) General: A & O * 3; sounds in no acute distress; in usual state of health.  Respiratory: speaking in full sentences, no conversational dyspnea; Psych: insight appears good, mood- appears full

## 2020-03-18 ENCOUNTER — Ambulatory Visit: Payer: Medicare PPO | Admitting: Endocrinology

## 2020-04-11 DIAGNOSIS — E119 Type 2 diabetes mellitus without complications: Secondary | ICD-10-CM | POA: Diagnosis not present

## 2020-04-11 LAB — HM DIABETES EYE EXAM

## 2020-04-14 ENCOUNTER — Other Ambulatory Visit: Payer: Self-pay

## 2020-04-14 ENCOUNTER — Encounter: Payer: Self-pay | Admitting: Endocrinology

## 2020-04-14 ENCOUNTER — Ambulatory Visit: Payer: Medicare PPO | Admitting: Endocrinology

## 2020-04-14 VITALS — BP 152/84 | HR 82 | Ht 66.0 in | Wt 176.0 lb

## 2020-04-14 DIAGNOSIS — E118 Type 2 diabetes mellitus with unspecified complications: Secondary | ICD-10-CM

## 2020-04-14 LAB — POCT GLYCOSYLATED HEMOGLOBIN (HGB A1C): Hemoglobin A1C: 9.4 % — AB (ref 4.0–5.6)

## 2020-04-14 MED ORDER — INSULIN NPH (HUMAN) (ISOPHANE) 100 UNIT/ML ~~LOC~~ SUSP
30.0000 [IU] | SUBCUTANEOUS | 11 refills | Status: DC
Start: 2020-04-14 — End: 2020-08-26

## 2020-04-14 NOTE — Patient Instructions (Addendum)
Please increase the NPH insulin to 30 units each morning.  On this type of insulin schedule, you should eat meals on a regular schedule.  If a meal is missed or significantly delayed, your blood sugar could go low.  If ever you have fever while taking methimazole, stop it and call us, even if the reason is obvious, because of the risk of a rare side-effect.  It is best to never miss the methimazole.  However, if you do miss it, next best is to double up the next time.   Please come back for a follow-up appointment in 3 months.

## 2020-04-14 NOTE — Progress Notes (Signed)
Subjective:    Patient ID: Amanda Douglas, female    DOB: 1945-10-06, 74 y.o.   MRN: [id number]  HPI Pt has chronic primary hypothyroidism (dx'ed 2010; she has been on prescribed thyroid hormone therapy since then; she has variable TFT--prob due to variable autoimmunity). Since on 75 mcg/d, pt states she feels well in general, except for fatigue. Pt also returns for f/u of diabetes mellitus:  DM type: Insulin-requiring type 2 Dx'ed: 4818 Complications: none.   Therapy: insulin since 2021   GDM: never DKA: never Severe hypoglycemia: never.   Pancreatitis: never Pancreatic imaging: normal on 2008 CT SDOH: she declines name brand meds.   Other: she did not tolerate metformin-XR (diarrrhea).   Interval history: pt states she feels well in general.  She says cbg's vary from 139-199.   Past Medical History:  Diagnosis Date  . Depression   . Diabetes mellitus without complication ([place])   . Hyperlipidemia   . Thyroid disease     Past Surgical History:  Procedure Laterality Date  . ANKLE FRACTURE SURGERY    . CESAREAN SECTION      Social History   Socioeconomic History  . Marital status: Married    Spouse name: Not on file  . Number of children: Not on file  . Years of education: Not on file  . Highest education level: Not on file  Occupational History  . Not on file  Tobacco Use  . Smoking status: Former Smoker    Packs/day: 1.00    Years: 15.00    Pack years: 15.00    Types: Cigarettes    Quit date: 06/14/1977    Years since quitting: 42.8  . Smokeless tobacco: Never Used  Vaping Use  . Vaping Use: Never used  Substance and Sexual Activity  . Alcohol use: No  . Drug use: No  . Sexual activity: Never  Other Topics Concern  . Not on file  Social History Narrative  . Not on file   Social Determinants of Health   Financial Resource Strain:   . Difficulty of Paying Living Expenses: Not on file  Food Insecurity:   . Worried About Charity fundraiser in the  Last Year: Not on file  . Ran Out of Food in the Last Year: Not on file  Transportation Needs:   . Lack of Transportation (Medical): Not on file  . Lack of Transportation (Non-Medical): Not on file  Physical Activity:   . Days of Exercise per Week: Not on file  . Minutes of Exercise per Session: Not on file  Stress:   . Feeling of Stress : Not on file  Social Connections:   . Frequency of Communication with Friends and Family: Not on file  . Frequency of Social Gatherings with Friends and Family: Not on file  . Attends Religious Services: Not on file  . Active Member of Clubs or Organizations: Not on file  . Attends Archivist Meetings: Not on file  . Marital Status: Not on file  Intimate Partner Violence:   . Fear of Current or Ex-Partner: Not on file  . Emotionally Abused: Not on file  . Physically Abused: Not on file  . Sexually Abused: Not on file    Current Outpatient Medications on File Prior to Visit  Medication Sig Dispense Refill  . Cholecalciferol (VITAMIN D3) 5000 units CAPS Take 1 capsule by mouth daily.    . citalopram (CELEXA) 10 MG tablet Take 1 tablet (10 mg total)  by mouth daily. 1 tab daily. 90 tablet 1  . esomeprazole (NEXIUM) 20 MG capsule Take 20 mg by mouth daily at 12 noon.    . fexofenadine (ALLEGRA) 180 MG tablet Take 1 tablet (180 mg total) by mouth daily. 90 tablet 3  . fluticasone (FLONASE) 50 MCG/ACT nasal spray Do 1 spray each nostril twice daily after your sinus rinses 16 g 2  . levothyroxine (SYNTHROID) 75 MCG tablet TAKE 1 TABLET BY MOUTH DAILY BEFORE BREAKFAST. 30 tablet 5  . Lysine 1000 MG TABS Take 1 tablet by mouth daily.    . montelukast (SINGULAIR) 10 MG tablet Take 1 tablet (10 mg total) by mouth at bedtime. 90 tablet 0  . Multiple Vitamin (MULTIVITAMIN) tablet Take 1 tablet by mouth daily.    . rosuvastatin (CRESTOR) 20 MG tablet Take 1 tablet (20 mg total) by mouth at bedtime. 90 tablet 1   No current facility-administered  medications on file prior to visit.    Allergies  Allergen Reactions  . Codeine Nausea And Vomiting  . Metformin And Related Diarrhea    Family History  Problem Relation Age of Onset  . Diabetes Mother   . Hypertension Mother   . Thyroid disease Mother   . Depression Mother   . COPD Sister   . Arthritis Sister   . Healthy Brother   . Healthy Daughter   . Diabetes Son   . Cancer Sister        squamous cell  . Hypertension Son     BP (!) 152/84   Pulse 82   Ht 5\' 6"  (1.676 m)   Wt 176 lb (79.8 kg)   SpO2 95%   BMI 28.41 kg/m    Review of Systems She denies hypoglycemia    Objective:   Physical Exam VITAL SIGNS:  See vs page GENERAL: no distress Pulses: dorsalis pedis intact bilat.   MSK: no deformity of the feet CV: trace bilat leg edema Skin:  no ulcer on the feet.  normal color and temp on the feet. Neuro: sensation is intact to touch on the feet  Lab Results  Component Value Date   HGBA1C 9.4 (A) 04/14/2020       Assessment & Plan:  Insulin-requiring type 2 DM: uncontrolled.   Patient Instructions  Please increase the NPH insulin to 30 units each morning.  On this type of insulin schedule, you should eat meals on a regular schedule.  If a meal is missed or significantly delayed, your blood sugar could go low.  If ever you have fever while taking methimazole, stop it and call us, even if the reason is obvious, because of the risk of a rare side-effect.  It is best to never miss the methimazole.  However, if you do miss it, next best is to double up the next time.   Please come back for a follow-up appointment in 3 months.

## 2020-04-18 ENCOUNTER — Other Ambulatory Visit: Payer: Self-pay | Admitting: Family Medicine

## 2020-04-18 DIAGNOSIS — E1169 Type 2 diabetes mellitus with other specified complication: Secondary | ICD-10-CM

## 2020-04-23 ENCOUNTER — Telehealth: Payer: Self-pay | Admitting: Physician Assistant

## 2020-04-23 DIAGNOSIS — E1169 Type 2 diabetes mellitus with other specified complication: Secondary | ICD-10-CM

## 2020-04-23 DIAGNOSIS — E785 Hyperlipidemia, unspecified: Secondary | ICD-10-CM

## 2020-04-23 MED ORDER — ROSUVASTATIN CALCIUM 20 MG PO TABS: 20.0000 mg | ORAL_TABLET | Freq: Every day | ORAL | 0 refills | Status: DC

## 2020-04-23 NOTE — Telephone Encounter (Signed)
Refill sent to pharmacy. AS, CMA 

## 2020-04-23 NOTE — Telephone Encounter (Signed)
Patient needs a refill on Crestor. Please send to [facility]. Thank you

## 2020-04-23 NOTE — Addendum Note (Signed)
Addended by: Mickel Crow on: 04/23/2020 10:06 AM   Modules accepted: Orders

## 2020-04-25 ENCOUNTER — Encounter: Payer: Self-pay | Admitting: Physician Assistant

## 2020-05-06 ENCOUNTER — Telehealth: Payer: Self-pay | Admitting: Endocrinology

## 2020-05-06 NOTE — Telephone Encounter (Signed)
Please decrease the NPH insulin to 28 units each morning.

## 2020-05-06 NOTE — Telephone Encounter (Signed)
Patient requests to be called at ph# [phone] re: Patient states since Dr. Loanne Drilling increased dosage of insulin her blood sugars have been dropping (Patient does not know what her blood sugar level is, she feels her blood sugars dropping.

## 2020-05-06 NOTE — Telephone Encounter (Signed)
Called patient, advised of recommendations from MD.  Patient verbalized understanding.

## 2020-05-06 NOTE — Telephone Encounter (Signed)
Will route to MD

## 2020-05-26 ENCOUNTER — Other Ambulatory Visit: Payer: Self-pay | Admitting: Family Medicine

## 2020-05-26 DIAGNOSIS — F4323 Adjustment disorder with mixed anxiety and depressed mood: Secondary | ICD-10-CM

## 2020-05-29 ENCOUNTER — Other Ambulatory Visit: Payer: Self-pay

## 2020-05-29 DIAGNOSIS — F4323 Adjustment disorder with mixed anxiety and depressed mood: Secondary | ICD-10-CM

## 2020-05-29 MED ORDER — CITALOPRAM HYDROBROMIDE 10 MG PO TABS: 10.0000 mg | ORAL_TABLET | Freq: Every day | ORAL | 0 refills | Status: DC

## 2020-06-02 ENCOUNTER — Other Ambulatory Visit: Payer: Self-pay | Admitting: Endocrinology

## 2020-06-04 ENCOUNTER — Ambulatory Visit (INDEPENDENT_AMBULATORY_CARE_PROVIDER_SITE_OTHER): Payer: Medicare PPO | Admitting: Physician Assistant

## 2020-06-04 ENCOUNTER — Encounter: Payer: Self-pay | Admitting: Physician Assistant

## 2020-06-04 ENCOUNTER — Other Ambulatory Visit: Payer: Self-pay

## 2020-06-04 VITALS — BP 139/74 | HR 98 | Ht 66.0 in | Wt 180.2 lb

## 2020-06-04 DIAGNOSIS — E118 Type 2 diabetes mellitus with unspecified complications: Secondary | ICD-10-CM | POA: Diagnosis not present

## 2020-06-04 DIAGNOSIS — E039 Hypothyroidism, unspecified: Secondary | ICD-10-CM | POA: Diagnosis not present

## 2020-06-04 DIAGNOSIS — I152 Hypertension secondary to endocrine disorders: Secondary | ICD-10-CM | POA: Diagnosis not present

## 2020-06-04 DIAGNOSIS — F4323 Adjustment disorder with mixed anxiety and depressed mood: Secondary | ICD-10-CM | POA: Diagnosis not present

## 2020-06-04 DIAGNOSIS — J3089 Other allergic rhinitis: Secondary | ICD-10-CM

## 2020-06-04 DIAGNOSIS — E1159 Type 2 diabetes mellitus with other circulatory complications: Secondary | ICD-10-CM

## 2020-06-04 LAB — POCT UA - MICROALBUMIN
Albumin/Creatinine Ratio, Urine, POC: <30
Creatinine, POC: 200 mg/dL
Microalbumin Ur, POC: 30 mg/L

## 2020-06-04 MED ORDER — MONTELUKAST SODIUM 10 MG PO TABS: 10.0000 mg | ORAL_TABLET | Freq: Every day | ORAL | 1 refills | Status: DC

## 2020-06-04 MED ORDER — CITALOPRAM HYDROBROMIDE 20 MG PO TABS
20.0000 mg | ORAL_TABLET | Freq: Every day | ORAL | 1 refills | Status: DC
Start: 2020-06-04 — End: 2021-01-14

## 2020-06-04 NOTE — Patient Instructions (Signed)

## 2020-06-04 NOTE — Progress Notes (Signed)
Established Patient Office Visit  Subjective:  Patient ID: Amanda Douglas, female    DOB: 1946-02-16  Age: 74 y.o. MRN: [id number]  CC:  Chief Complaint  Patient presents with  . Diabetes  . Hypertension  . Hyperlipidemia    HPI [place] presents for follow up on diabetes mellitus, hypertension, thyroid disease and mood management.  Diabetes mellitus: Pt denies increased urination or thirst. Pt is followed by Endocrinology.  Dr. Loanne Drilling had increased insulin to 30 units and patient started experiencing hypoglycemic events so insulin was reduced to 27 units. Checking glucose at home. FBS vary from 140s-200s. States unsure why A1c continues to be elevated, monitors carbohydrates and sweets.  HTN: Pt denies chest pain, palpitations, dizziness or lower extremity swelling. Checks BP at home and readings fluctuate. Pt tries to follow a low salt diet.  Hypothyroid: Reports medication compliance.  Asymptomatic. Managed by Endocrinology.   Mood: Feels like she would benefit from increasing the dose to 20 mg and would like to give it a try. Sometimes goes through fluctuations in mood and energy levels. Has been on lowest dose for a while.  Denies SI/HI.  The addition of Singulair has significantly helped with seasonal allergies and chronic sinusitis.  Requesting refill.   Past Medical History:  Diagnosis Date  . Depression   . Diabetes mellitus without complication ([place])   . Hyperlipidemia   . Thyroid disease     Past Surgical History:  Procedure Laterality Date  . ANKLE FRACTURE SURGERY    . CESAREAN SECTION      Family History  Problem Relation Age of Onset  . Diabetes Mother   . Hypertension Mother   . Thyroid disease Mother   . Depression Mother   . COPD Sister   . Arthritis Sister   . Healthy Brother   . Healthy Daughter   . Diabetes Son   . Cancer Sister        squamous cell  . Hypertension Son     Social History   Socioeconomic History  . Marital  status: Married    Spouse name: Not on file  . Number of children: Not on file  . Years of education: Not on file  . Highest education level: Not on file  Occupational History  . Not on file  Tobacco Use  . Smoking status: Former Smoker    Packs/day: 1.00    Years: 15.00    Pack years: 15.00    Types: Cigarettes    Quit date: 06/14/1977    Years since quitting: 43.0  . Smokeless tobacco: Never Used  Vaping Use  . Vaping Use: Never used  Substance and Sexual Activity  . Alcohol use: No  . Drug use: No  . Sexual activity: Never  Other Topics Concern  . Not on file  Social History Narrative  . Not on file   Social Determinants of Health   Financial Resource Strain: Not on file  Food Insecurity: Not on file  Transportation Needs: Not on file  Physical Activity: Not on file  Stress: Not on file  Social Connections: Not on file  Intimate Partner Violence: Not on file    Outpatient Medications Prior to Visit  Medication Sig Dispense Refill  . Cholecalciferol (VITAMIN D3) 5000 units CAPS Take 1 capsule by mouth daily.    Marland Kitchen esomeprazole (NEXIUM) 20 MG capsule Take 20 mg by mouth daily at 12 noon.    . fexofenadine (ALLEGRA) 180 MG tablet Take 1  tablet (180 mg total) by mouth daily. 90 tablet 3  . fluticasone (FLONASE) 50 MCG/ACT nasal spray Do 1 spray each nostril twice daily after your sinus rinses 16 g 2  . insulin NPH Human (NOVOLIN N) 100 UNIT/ML injection Inject 0.3 mLs (30 Units total) into the skin every morning. And syringes 1/day 10 mL 11  . levothyroxine (SYNTHROID) 75 MCG tablet TAKE 1 TABLET BY MOUTH DAILY BEFORE BREAKFAST. 30 tablet 5  . Lysine 1000 MG TABS Take 1 tablet by mouth daily.    . Multiple Vitamin (MULTIVITAMIN) tablet Take 1 tablet by mouth daily.    . rosuvastatin (CRESTOR) 20 MG tablet Take 1 tablet (20 mg total) by mouth at bedtime. 90 tablet 0  . citalopram (CELEXA) 10 MG tablet Take 1 tablet (10 mg total) by mouth daily. 1 tab daily. 60 tablet 0  .  montelukast (SINGULAIR) 10 MG tablet Take 1 tablet (10 mg total) by mouth at bedtime. 90 tablet 0   No facility-administered medications prior to visit.    Allergies  Allergen Reactions  . Codeine Nausea And Vomiting  . Metformin And Related Diarrhea    ROS Review of Systems A fourteen system review of systems was performed and found to be positive as per HPI.   Objective:    Physical Exam General:  Well Developed, well nourished, appropriate for stated age.  Neuro:  Alert and oriented,  extra-ocular muscles intact, no focal deficits  HEENT:  Normocephalic, atraumatic, neck supple  Skin:  no gross rash, warm, pink. Cardiac:  RRR, S1 S2, w/o murmur Respiratory:  ECTA B/L, Not using accessory muscles, speaking in full sentences- unlabored. Vascular:  Ext warm, no cyanosis apprec.; no gross edema Psych:  No HI/SI, judgement and insight good, Euthymic mood. Full Affect.  There were no vitals taken for this visit. Wt Readings from Last 3 Encounters:  04/14/20 176 lb (79.8 kg)  02/28/20 174 lb (78.9 kg)  01/10/20 173 lb 3.2 oz (78.6 kg)     Health Maintenance Due  Topic Date Due  . COVID-19 Vaccine (1) Never done  . TETANUS/TDAP  Never done  . DEXA SCAN  Never done  . PNA vac Low Risk Adult (1 of 2 - PCV13) Never done  . INFLUENZA VACCINE  Never done    There are no preventive care reminders to display for this patient.  Lab Results  Component Value Date   TSH 2.56 01/10/2020   Lab Results  Component Value Date   WBC 6.3 11/16/2018   HGB 13.1 11/16/2018   HCT 39.1 11/16/2018   MCV 86 11/16/2018   PLT 285 11/16/2018   Lab Results  Component Value Date   NA 133 (L) 01/10/2020   K 4.7 01/10/2020   CO2 28 01/10/2020   GLUCOSE 274 (H) 01/10/2020   BUN 21 01/10/2020   CREATININE 0.79 01/10/2020   BILITOT 0.4 05/01/2019   ALKPHOS 80 05/01/2019   AST 13 05/01/2019   ALT 17 07/03/2019   PROT 6.1 05/01/2019   ALBUMIN 3.9 05/01/2019   CALCIUM 9.8 01/10/2020    GFR 70.85 01/10/2020   Lab Results  Component Value Date   CHOL 153 07/03/2019   Lab Results  Component Value Date   HDL 51 07/03/2019   Lab Results  Component Value Date   LDLCALC 78 07/03/2019   Lab Results  Component Value Date   TRIG 135 07/03/2019   Lab Results  Component Value Date   CHOLHDL 3.0 07/03/2019  Lab Results  Component Value Date   HGBA1C 9.4 (A) 04/14/2020      Assessment & Plan:   Problem List Items Addressed This Visit      Cardiovascular and Mediastinum   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 -Continue to follow-up with endocrinology. -Continue current medication regimen and ambulatory glucose monitoring. -Continue to monitor carbohydrates and glucose and stay as active as possible. -UA microalbumin performed today, normal.  Hypertension associated with diabetes: -Fairly controlled with diet. -Recommend to continue with monitoring sodium and stay well-hydrated. -Will continue to monitor.  Adjustment disorder with mixed anxiety and depressed mood: -PHQ-9 score of 0. -Discussed with patient reasonable to trial Celexa 20 mg (max dose for pt) for more consistent and stabilized mood.  Advised to let me know if unable to tolerate increased dose. -Will continue to monitor.  Hypothyroidism: -Last TSH 2.56, WNL -Continue to follow-up with endocrinology.  Continue current medication regimen.  Environmental and seasonal  allergies: -Improved.  Continue current medication regimen.  Provided refill.  Meds ordered this encounter  Medications  . citalopram (CELEXA) 20 MG tablet    Sig: Take 1 tablet (20 mg total) by mouth daily.    Dispense:  90 tablet    Refill:  1    Order Specific Question:   Supervising Provider    Answer:   Beatrice Lecher D [[id number]]  . DISCONTD: montelukast (SINGULAIR) 10 MG tablet    Sig: Take 1 tablet (10 mg total) by mouth at bedtime.    Dispense:  90 tablet    Refill:  1    Order Specific Question:   Supervising Provider    Answer:   Beatrice Lecher D [[id number]]  . montelukast (SINGULAIR) 10 MG tablet    Sig: Take 1 tablet (10 mg total) by mouth at bedtime.    Dispense:  90 tablet    Refill:  1    Follow-up: Return in about 4 months (around 10/03/2020) for MCW and FBW few days prior .   Note:  This note was prepared with assistance of Dragon voice recognition software. Occasional wrong-word or sound-a-like substitutions may have occurred due to the inherent limitations of voice recognition software.  Lorrene Reid, PA-C

## 2020-07-29 ENCOUNTER — Ambulatory Visit: Payer: Medicare PPO | Admitting: Endocrinology

## 2020-08-04 ENCOUNTER — Other Ambulatory Visit: Payer: Self-pay | Admitting: Physician Assistant

## 2020-08-04 DIAGNOSIS — E1169 Type 2 diabetes mellitus with other specified complication: Secondary | ICD-10-CM

## 2020-08-26 ENCOUNTER — Ambulatory Visit: Payer: Medicare PPO | Admitting: Endocrinology

## 2020-08-26 ENCOUNTER — Other Ambulatory Visit: Payer: Self-pay

## 2020-08-26 VITALS — BP 154/80 | HR 92 | Ht 66.0 in | Wt 176.6 lb

## 2020-08-26 DIAGNOSIS — E118 Type 2 diabetes mellitus with unspecified complications: Secondary | ICD-10-CM

## 2020-08-26 LAB — POCT GLYCOSYLATED HEMOGLOBIN (HGB A1C): Hemoglobin A1C: 8.9 % — AB (ref 4.0–5.6)

## 2020-08-26 MED ORDER — NOVOLIN 70/30 FLEXPEN RELION (70-30) 100 UNIT/ML ~~LOC~~ SUPN: 30.0000 [IU] | PEN_INJECTOR | Freq: Every day | SUBCUTANEOUS | 11 refills | Status: DC

## 2020-08-26 NOTE — Patient Instructions (Addendum)
Your blood pressure is high today.  Please see your primary care provider soon, to have it rechecked Please increase the NPH insulin to "70/30," 30 units with breakfast. On this type of insulin schedule, you should eat meals on a regular schedule.  If a meal is missed or significantly delayed, your blood sugar could go low.  It is best to never miss the levothyroxine.  However, if you do miss it, next best is to double up the next time.   check your blood sugar twice a day.  vary the time of day when you check, between before the 3 meals, and at bedtime.  also check if you have symptoms of your blood sugar being too high or too low.  please keep a record of the readings and bring it to your next appointment here (or you can bring the meter itself).  You can write it on any piece of paper.  please call us sooner if your blood sugar goes below 70, or if most of your readings are over 200. Please come back for a follow-up appointment in 2 months.

## 2020-08-26 NOTE — Progress Notes (Signed)
Subjective:    Patient ID: Amanda Douglas, female    DOB: 10-05-1945, 75 y.o.   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 Since on 75 mcg/d, pt states she feels well in general, except for fatigue.  Pt also returns for f/u of diabetes mellitus:  DM type: Insulin-requiring type 2 Dx'ed: 1749 Complications: none.   Therapy: insulin since 2021   GDM: never DKA: never Severe hypoglycemia: never.   Pancreatitis: never Pancreatic imaging: normal on 2008 CT SDOH: she declines name brand meds, and multiple daily injections  Other: she did not tolerate metformin-XR (diarrrhea).   Interval history: She says cbg's vary from 176-214.  It is in general higher as the day goes on.  She reduced insulin to 27 units, due to sxs of tremor, difficulty with concentration, and sweaty.  These sxs happen fasting, but she has not checked cbg during sxs.   Past Medical History:  Diagnosis Date  . Depression   . Diabetes mellitus without complication ([place])   . Hyperlipidemia   . Thyroid disease     Past Surgical History:  Procedure Laterality Date  . ANKLE FRACTURE SURGERY    . CESAREAN SECTION      Social History   Socioeconomic History  . Marital status: Married    Spouse name: Not on file  . Number of children: Not on file  . Years of education: Not on file  . Highest education level: Not on file  Occupational History  . Not on file  Tobacco Use  . Smoking status: Former Smoker    Packs/day: 1.00    Years: 15.00    Pack years: 15.00    Types: Cigarettes    Quit date: 06/14/1977    Years since quitting: 43.2  . Smokeless tobacco: Never Used  Vaping Use  . Vaping Use: Never used  Substance and Sexual Activity  . Alcohol use: No  . Drug use: No  . Sexual activity: Never  Other Topics Concern  . Not on file  Social History Narrative   . Not on file   Social Determinants of Health   Financial Resource Strain: Not on file  Food Insecurity: Not on file  Transportation Needs: Not on file  Physical Activity: Not on file  Stress: Not on file  Social Connections: Not on file  Intimate Partner Violence: Not on file    Current Outpatient Medications on File Prior to Visit  Medication Sig Dispense Refill  . Cholecalciferol (VITAMIN D3) 5000 units CAPS Take 1 capsule by mouth daily.    . citalopram (CELEXA) 20 MG tablet Take 1 tablet (20 mg total) by mouth daily. 90 tablet 1  . esomeprazole (NEXIUM) 20 MG capsule Take 20 mg by mouth daily at 12 noon.    . fexofenadine (ALLEGRA) 180 MG tablet Take 1 tablet (180 mg total) by mouth daily. 90 tablet 3  . fluticasone (FLONASE) 50 MCG/ACT nasal spray Do 1 spray each nostril twice daily after your sinus rinses 16 g 2  . levothyroxine (SYNTHROID) 75 MCG tablet TAKE 1 TABLET BY MOUTH DAILY BEFORE BREAKFAST. 30 tablet 5  . Lysine 1000 MG TABS Take 1 tablet by mouth daily.    . montelukast (SINGULAIR) 10 MG tablet Take 1 tablet (10 mg total) by mouth at bedtime. 90 tablet 1  . Multiple Vitamin (MULTIVITAMIN) tablet Take 1 tablet  by mouth daily.    . rosuvastatin (CRESTOR) 20 MG tablet TAKE 1 TABLET (20 MG TOTAL) BY MOUTH AT BEDTIME. 90 tablet 0   No current facility-administered medications on file prior to visit.    Allergies  Allergen Reactions  . Codeine Nausea And Vomiting  . Metformin And Related Diarrhea    Family History  Problem Relation Age of Onset  . Diabetes Mother   . Hypertension Mother   . Thyroid disease Mother   . Depression Mother   . COPD Sister   . Arthritis Sister   . Healthy Brother   . Healthy Daughter   . Diabetes Son   . Cancer Sister        squamous cell  . Hypertension Son     BP (!) 154/80 (BP Location: Right Arm, Patient Position: Sitting, Cuff Size: Normal)   Pulse 92   Ht 5\' 6"  (1.676 m)   Wt 176 lb 9.6 oz (80.1 kg)   SpO2 96%    BMI 28.50 kg/m    Review of Systems Denies LOC.      Objective:   Physical Exam VITAL SIGNS:  See vs page GENERAL: no distress Pulses: dorsalis pedis intact bilat.   MSK: no deformity of the feet CV: trace right leg edema, and none on the left.   Skin:  no ulcer on the feet, but skin is dry.  normal color and temp on the feet. Neuro: sensation is intact to touch on the feet.    Lab Results  Component Value Date   HGBA1C 8.9 (A) 08/26/2020    Lab Results  Component Value Date   TSH 2.56 01/10/2020       Assessment & Plan:  HTN: is noted today.  Insulin-requiring type 2 DM: uncontrolled.    Patient Instructions  Your blood pressure is high today.  Please see your primary care provider soon, to have it rechecked Please increase the NPH insulin to "70/30," 30 units with breakfast. On this type of insulin schedule, you should eat meals on a regular schedule.  If a meal is missed or significantly delayed, your blood sugar could go low.  It is best to never miss the levothyroxine.  However, if you do miss it, next best is to double up the next time.   check your blood sugar twice a day.  vary the time of day when you check, between before the 3 meals, and at bedtime.  also check if you have symptoms of your blood sugar being too high or too low.  please keep a record of the readings and bring it to your next appointment here (or you can bring the meter itself).  You can write it on any piece of paper.  please call us sooner if your blood sugar goes below 70, or if most of your readings are over 200. Please come back for a follow-up appointment in 2 months.

## 2020-08-28 DIAGNOSIS — H25812 Combined forms of age-related cataract, left eye: Secondary | ICD-10-CM | POA: Diagnosis not present

## 2020-09-08 DIAGNOSIS — H25812 Combined forms of age-related cataract, left eye: Secondary | ICD-10-CM | POA: Diagnosis not present

## 2020-09-08 DIAGNOSIS — H2512 Age-related nuclear cataract, left eye: Secondary | ICD-10-CM | POA: Diagnosis not present

## 2020-09-22 ENCOUNTER — Other Ambulatory Visit: Payer: Self-pay | Admitting: Physician Assistant

## 2020-09-22 DIAGNOSIS — I152 Hypertension secondary to endocrine disorders: Secondary | ICD-10-CM

## 2020-09-22 DIAGNOSIS — E559 Vitamin D deficiency, unspecified: Secondary | ICD-10-CM

## 2020-09-22 DIAGNOSIS — E039 Hypothyroidism, unspecified: Secondary | ICD-10-CM

## 2020-09-22 DIAGNOSIS — E1159 Type 2 diabetes mellitus with other circulatory complications: Secondary | ICD-10-CM

## 2020-09-22 DIAGNOSIS — E118 Type 2 diabetes mellitus with unspecified complications: Secondary | ICD-10-CM

## 2020-09-22 DIAGNOSIS — E1169 Type 2 diabetes mellitus with other specified complication: Secondary | ICD-10-CM

## 2020-09-29 DIAGNOSIS — H25811 Combined forms of age-related cataract, right eye: Secondary | ICD-10-CM | POA: Diagnosis not present

## 2020-09-29 DIAGNOSIS — H2513 Age-related nuclear cataract, bilateral: Secondary | ICD-10-CM | POA: Diagnosis not present

## 2020-09-29 DIAGNOSIS — H2511 Age-related nuclear cataract, right eye: Secondary | ICD-10-CM | POA: Diagnosis not present

## 2020-10-01 ENCOUNTER — Other Ambulatory Visit: Payer: Medicare PPO

## 2020-10-01 ENCOUNTER — Other Ambulatory Visit: Payer: Self-pay

## 2020-10-01 DIAGNOSIS — E039 Hypothyroidism, unspecified: Secondary | ICD-10-CM

## 2020-10-01 DIAGNOSIS — E1169 Type 2 diabetes mellitus with other specified complication: Secondary | ICD-10-CM

## 2020-10-01 DIAGNOSIS — E559 Vitamin D deficiency, unspecified: Secondary | ICD-10-CM | POA: Diagnosis not present

## 2020-10-01 DIAGNOSIS — I152 Hypertension secondary to endocrine disorders: Secondary | ICD-10-CM | POA: Diagnosis not present

## 2020-10-01 DIAGNOSIS — E785 Hyperlipidemia, unspecified: Secondary | ICD-10-CM | POA: Diagnosis not present

## 2020-10-01 DIAGNOSIS — E118 Type 2 diabetes mellitus with unspecified complications: Secondary | ICD-10-CM

## 2020-10-01 DIAGNOSIS — E1159 Type 2 diabetes mellitus with other circulatory complications: Secondary | ICD-10-CM

## 2020-10-02 ENCOUNTER — Telehealth: Payer: Self-pay | Admitting: Physician Assistant

## 2020-10-02 LAB — HEMOGLOBIN A1C
Est. average glucose Bld gHb Est-mCnc: 237 mg/dL
Hgb A1c MFr Bld: 9.9 % — ABNORMAL HIGH (ref 4.8–5.6)

## 2020-10-02 LAB — COMPREHENSIVE METABOLIC PANEL
ALT: 25 IU/L (ref 0–32)
AST: 16 IU/L (ref 0–40)
Albumin/Globulin Ratio: 2.3 — ABNORMAL HIGH (ref 1.2–2.2)
Albumin: 4.5 g/dL (ref 3.7–4.7)
Alkaline Phosphatase: 98 IU/L (ref 44–121)
BUN/Creatinine Ratio: 14 (ref 12–28)
BUN: 12 mg/dL (ref 8–27)
Bilirubin Total: 0.6 mg/dL (ref 0.0–1.2)
CO2: 22 mmol/L (ref 20–29)
Calcium: 9.1 mg/dL (ref 8.7–10.3)
Chloride: 95 mmol/L — ABNORMAL LOW (ref 96–106)
Creatinine, Ser: 0.86 mg/dL (ref 0.57–1.00)
Globulin, Total: 2 g/dL (ref 1.5–4.5)
Glucose: 383 mg/dL — ABNORMAL HIGH (ref 65–99)
Potassium: 5 mmol/L (ref 3.5–5.2)
Sodium: 135 mmol/L (ref 134–144)
Total Protein: 6.5 g/dL (ref 6.0–8.5)
eGFR: 70 mL/min/{1.73_m2} (ref >59)

## 2020-10-02 LAB — CBC
Hematocrit: 42.8 % (ref 34.0–46.6)
Hemoglobin: 13.7 g/dL (ref 11.1–15.9)
MCH: 29.1 pg (ref 26.6–33.0)
MCHC: 32 g/dL (ref 31.5–35.7)
MCV: 91 fL (ref 79–97)
Platelets: 298 10*3/uL (ref 150–450)
RBC: 4.7 x10E6/uL (ref 3.77–5.28)
RDW: 13.1 % (ref 11.7–15.4)
WBC: 8.3 10*3/uL (ref 3.4–10.8)

## 2020-10-02 LAB — VITAMIN D 25 HYDROXY (VIT D DEFICIENCY, FRACTURES): Vit D, 25-Hydroxy: 56.9 ng/mL (ref 30.0–100.0)

## 2020-10-02 LAB — TSH: TSH: 4.61 u[IU]/mL — ABNORMAL HIGH (ref 0.450–4.500)

## 2020-10-02 LAB — LIPID PANEL
Chol/HDL Ratio: 2.6 ratio (ref 0.0–4.4)
Cholesterol, Total: 161 mg/dL (ref 100–199)
HDL: 63 mg/dL (ref >39)
LDL Chol Calc (NIH): 72 mg/dL (ref 0–99)
Triglycerides: 157 mg/dL — ABNORMAL HIGH (ref 0–149)
VLDL Cholesterol Cal: 26 mg/dL (ref 5–40)

## 2020-10-02 NOTE — Telephone Encounter (Signed)
Labs have been added. AS, CMA 

## 2020-10-02 NOTE — Telephone Encounter (Signed)
-----   Message from Lorrene Reid, [place] sent at 10/02/2020 11:53 AM EDT ----- Please contact [facility] and add free T4 and T3.  Thank you, Herb Grays

## 2020-10-03 ENCOUNTER — Ambulatory Visit: Payer: Medicare PPO | Admitting: Physician Assistant

## 2020-10-03 LAB — T3: T3, Total: 92 ng/dL (ref 71–180)

## 2020-10-03 LAB — SPECIMEN STATUS REPORT

## 2020-10-03 LAB — T4, FREE: Free T4: 1.62 ng/dL (ref 0.82–1.77)

## 2020-10-22 ENCOUNTER — Encounter: Payer: Self-pay | Admitting: Physician Assistant

## 2020-10-22 ENCOUNTER — Ambulatory Visit (INDEPENDENT_AMBULATORY_CARE_PROVIDER_SITE_OTHER): Payer: Medicare PPO | Admitting: Physician Assistant

## 2020-10-22 VITALS — Ht 66.0 in | Wt 176.0 lb

## 2020-10-22 DIAGNOSIS — E118 Type 2 diabetes mellitus with unspecified complications: Secondary | ICD-10-CM | POA: Diagnosis not present

## 2020-10-22 DIAGNOSIS — Z0000 Encounter for general adult medical examination without abnormal findings: Secondary | ICD-10-CM | POA: Diagnosis not present

## 2020-10-22 MED ORDER — EMPAGLIFLOZIN 10 MG PO TABS: 10.0000 mg | ORAL_TABLET | Freq: Every day | ORAL | 0 refills | Status: DC

## 2020-10-22 NOTE — Patient Instructions (Signed)
Preventive Care 61 Years and Older, Female Preventive care refers to lifestyle choices and visits with your health care provider that can promote health and wellness. This includes:  A yearly physical exam. This is also called an annual wellness visit.  Regular dental and eye exams.  Immunizations.  Screening for certain conditions.  Healthy lifestyle choices, such as: ? Eating a healthy diet. ? Getting regular exercise. ? Not using drugs or products that contain nicotine and tobacco. ? Limiting alcohol use. What can I expect for my preventive care visit? Physical exam Your health care provider will check your:  Height and weight. These may be used to calculate your BMI (body mass index). BMI is a measurement that tells if you are at a healthy weight.  Heart rate and blood pressure.  Body temperature.  Skin for abnormal spots. Counseling Your health care provider may ask you questions about your:  Past medical problems.  Family's medical history.  Alcohol, tobacco, and drug use.  Emotional well-being.  Home life and relationship well-being.  Sexual activity.  Diet, exercise, and sleep habits.  History of falls.  Memory and ability to understand (cognition).  Work and work Statistician.  Pregnancy and menstrual history.  Access to firearms. What immunizations do I need? Vaccines are usually given at various ages, according to a schedule. Your health care provider will recommend vaccines for you based on your age, medical history, and lifestyle or other factors, such as travel or where you work.   What tests do I need? Blood tests  Lipid and cholesterol levels. These may be checked every 5 years, or more often depending on your overall health.  Hepatitis C test.  Hepatitis B test. Screening  Lung cancer screening. You may have this screening every year starting at age 74 if you have a 30-pack-year history of smoking and currently smoke or have quit within  the past 15 years.  Colorectal cancer screening. ? All adults should have this screening starting at age 44 and continuing until age 58. ? Your health care provider may recommend screening at age 2 if you are at increased risk. ? You will have tests every 1-10 years, depending on your results and the type of screening test.  Diabetes screening. ? This is done by checking your blood sugar (glucose) after you have not eaten for a while (fasting). ? You may have this done every 1-3 years.  Mammogram. ? This may be done every 1-2 years. ? Talk with your health care provider about how often you should have regular mammograms.  Abdominal aortic aneurysm (AAA) screening. You may need this if you are a current or former smoker.  BRCA-related cancer screening. This may be done if you have a family history of breast, ovarian, tubal, or peritoneal cancers. Other tests  STD (sexually transmitted disease) testing, if you are at risk.  Bone density scan. This is done to screen for osteoporosis. You may have this done starting at age 104. Talk with your health care provider about your test results, treatment options, and if necessary, the need for more tests. Follow these instructions at home: Eating and drinking  Eat a diet that includes fresh fruits and vegetables, whole grains, lean protein, and low-fat dairy products. Limit your intake of foods with high amounts of sugar, saturated fats, and salt.  Take vitamin and mineral supplements as recommended by your health care provider.  Do not drink alcohol if your health care provider tells you not to drink.  If you drink alcohol: ? Limit how much you have to 0-1 drink a day. ? Be aware of how much alcohol is in your drink. In the [place] one drink equals one 12 oz bottle of beer (355 mL), one 5 oz glass of wine (148 mL), or one 1 oz glass of hard liquor (44 mL).   Lifestyle  Take daily care of your teeth and gums. Brush your teeth every morning  and night with fluoride toothpaste. Floss one time each day.  Stay active. Exercise for at least 30 minutes 5 or more days each week.  Do not use any products that contain nicotine or tobacco, such as cigarettes, e-cigarettes, and chewing tobacco. If you need help quitting, ask your health care provider.  Do not use drugs.  If you are sexually active, practice safe sex. Use a condom or other form of protection in order to prevent STIs (sexually transmitted infections).  Talk with your health care provider about taking a low-dose aspirin or statin.  Find healthy ways to cope with stress, such as: ? Meditation, yoga, or listening to music. ? Journaling. ? Talking to a trusted person. ? Spending time with friends and family. Safety  Always wear your seat belt while driving or riding in a vehicle.  Do not drive: ? If you have been drinking alcohol. Do not ride with someone who has been drinking. ? When you are tired or distracted. ? While texting.  Wear a helmet and other protective equipment during sports activities.  If you have firearms in your house, make sure you follow all gun safety procedures. What's next?  Visit your health care provider once a year for an annual wellness visit.  Ask your health care provider how often you should have your eyes and teeth checked.  Stay up to date on all vaccines. This information is not intended to replace advice given to you by your health care provider. Make sure you discuss any questions you have with your health care provider. Document Revised: 05/21/2020 Document Reviewed: 05/25/2018 Elsevier Patient Education  2021 [employer]  

## 2020-10-22 NOTE — Progress Notes (Signed)
Virtual Visit via Telephone Note:  I connected with Amanda Douglas by telephone and verified that I am speaking with the correct person using two identifiers.    I discussed the limitations, risks, security and privacy concerns for performing an evaluation and management service by telephone and the availability of in person appointments. The staff discussed with patient that there may be a patient responsible charge related to this service. The patient expressed understanding and agreed to proceed.   Location of Patient- Home Location of Provider- Office    Subjective:   Amanda Douglas is a 75 y.o. female who presents for Medicare Annual (Subsequent) preventive examination.  Review of Systems    General:   No F/C, wt loss Pulm:   No DIB, SOB, pleuritic chest pain Card:  No CP, palpitations Abd:  No n/v/d or pain Ext:  No inc edema from baseline    Objective:    Today's Vitals   10/22/20 1036  Weight: 176 lb (79.8 kg)  Height: 5\' 6"  (1.676 m)   Body mass index is 28.41 kg/m.  Advanced Directives 03/03/2017 04/19/2016  Does Patient Have a Medical Advance Directive? No No  Would patient like information on creating a medical advance directive? No - Patient declined No - patient declined information    Current Medications (verified) Outpatient Encounter Medications as of 10/22/2020  Medication Sig  . Cholecalciferol (VITAMIN D3) 5000 units CAPS Take 1 capsule by mouth daily.  . citalopram (CELEXA) 20 MG tablet Take 1 tablet (20 mg total) by mouth daily.  . empagliflozin (JARDIANCE) 10 MG TABS tablet Take 1 tablet (10 mg total) by mouth daily before breakfast.  . esomeprazole (NEXIUM) 20 MG capsule Take 20 mg by mouth daily at 12 noon.  . fexofenadine (ALLEGRA) 180 MG tablet Take 1 tablet (180 mg total) by mouth daily.  . fluticasone (FLONASE) 50 MCG/ACT nasal spray Do 1 spray each nostril twice daily after your sinus rinses  . insulin isophane & regular human (NOVOLIN  70/30 FLEXPEN RELION) (70-30) 100 UNIT/ML KwikPen Inject 30 Units into the skin daily with breakfast. And pen needles 1/day  . levothyroxine (SYNTHROID) 75 MCG tablet TAKE 1 TABLET BY MOUTH DAILY BEFORE BREAKFAST.  Marland Kitchen Lysine 1000 MG TABS Take 1 tablet by mouth daily.  . montelukast (SINGULAIR) 10 MG tablet Take 1 tablet (10 mg total) by mouth at bedtime.  . Multiple Vitamin (MULTIVITAMIN) tablet Take 1 tablet by mouth daily.  . rosuvastatin (CRESTOR) 20 MG tablet TAKE 1 TABLET (20 MG TOTAL) BY MOUTH AT BEDTIME.   No facility-administered encounter medications on file as of 10/22/2020.    Allergies (verified) Codeine and Metformin and related   History: Past Medical History:  Diagnosis Date  . Depression   . Diabetes mellitus without complication ([place])   . Hyperlipidemia   . Thyroid disease    Past Surgical History:  Procedure Laterality Date  . ANKLE FRACTURE SURGERY    . CESAREAN SECTION     Family History  Problem Relation Age of Onset  . Diabetes Mother   . Hypertension Mother   . Thyroid disease Mother   . Depression Mother   . COPD Sister   . Arthritis Sister   . Healthy Brother   . Healthy Daughter   . Diabetes Son   . Cancer Sister        squamous cell  . Hypertension Son    Social History   Socioeconomic History  . Marital status: Married  Spouse name: Not on file  . Number of children: Not on file  . Years of education: Not on file  . Highest education level: Not on file  Occupational History  . Not on file  Tobacco Use  . Smoking status: Former Smoker    Packs/day: 1.00    Years: 15.00    Pack years: 15.00    Types: Cigarettes    Quit date: 06/14/1977    Years since quitting: 43.3  . Smokeless tobacco: Never Used  Vaping Use  . Vaping Use: Never used  Substance and Sexual Activity  . Alcohol use: No  . Drug use: No  . Sexual activity: Never  Other Topics Concern  . Not on file  Social History Narrative  . Not on file   Social Determinants  of Health   Financial Resource Strain: Not on file  Food Insecurity: Not on file  Transportation Needs: Not on file  Physical Activity: Not on file  Stress: Not on file  Social Connections: Not on file    Tobacco Counseling Counseling given: Not Answered   Diabetic?Yes         Activities of Daily Living In your present state of health, do you have any difficulty performing the following activities: 10/22/2020 06/04/2020  Hearing? N N  Vision? N Y  Difficulty concentrating or making decisions? N N  Walking or climbing stairs? N N  Dressing or bathing? N N  Doing errands, shopping? N N  Some recent data might be hidden    Patient Care Team: Lorrene Reid, PA-C as PCP - General Margaretmary Bayley, MD as Referring Physician (Internal Medicine) Gastroenterology, [place] as Consulting Physician (Gastroenterology) Renato Shin, MD as Consulting Physician (Endocrinology) Everlene Farrier, MD as Consulting Physician (Obstetrics and Gynecology)  Indicate any recent Medical Services you may have received from other than [facility] providers in the past year (date may be approximate).     Assessment:   This is a routine wellness examination for [place].  Hearing/Vision screen No exam data present  Dietary issues and exercise activities discussed: -Recommend to continue to monitor carbohydrates and glucose intake, follow a heart healthy diet,  continue to stay as active as possible.   Goals Addressed   None    Depression Screen PHQ 2/9 Scores 10/22/2020 06/04/2020 02/28/2020 11/27/2019 10/12/2019 05/30/2019 05/01/2019  PHQ - 2 Score 0 0 0 0 0 0 0  PHQ- 9 Score 5 0 2 6 3 2 2     Fall Risk Fall Risk  10/22/2020 06/04/2020 02/28/2020 05/01/2019 04/11/2019  Falls in the past year? 0 0 0 0 0  Number falls in past yr: 0 - - 0 0  Injury with Fall? 0 - - 0 0  Risk for fall due to : No Fall Risks - - - -  Follow up Falls evaluation completed Falls evaluation completed Falls  evaluation completed Falls evaluation completed -    FALL RISK PREVENTION PERTAINING TO THE HOME:  Any stairs in or around the home? No  If so, are there any without handrails? No  Home free of loose throw rugs in walkways, pet beds, electrical cords, etc? Yes  Adequate lighting in your home to reduce risk of falls? Yes   ASSISTIVE DEVICES UTILIZED TO PREVENT FALLS:  Life alert? No  Use of a cane, walker or w/c? No  Grab bars in the bathroom? No  Shower chair or bench in shower? Yes  Elevated toilet seat or a handicapped toilet? No  TIMED UP AND GO:  Was the test performed? No .  Length of time to ambulate 10 feet:  sec.   Telehealth  Cognitive Function: wnl MMSE - Mini Mental State Exam 05/01/2019  Orientation to time 5  Orientation to Place 5  Registration 3  Attention/ Calculation 5  Recall 3  Language- name 2 objects 2  Language- repeat 1  Language- follow 3 step command (No Data)  Language- follow 3 step command-comments Virtual appointment  Language- read & follow direction (No Data)  Language-read & follow direction-comments virtual appointment  Write a sentence (No Data)  Write a sentence-comments virtual appointment  Copy design (No Data)  Copy design-comments virtual appointment     6CIT Screen 10/22/2020 05/01/2019  What Year? 0 points 0 points  What month? 0 points 0 points  What time? 0 points 0 points  Count back from 20 2 points 0 points  Months in reverse 0 points 0 points  Repeat phrase 0 points 4 points  Total Score 2 4    Immunizations There is no immunization history for the selected administration types on file for this patient.  TDAP status: Due, Education has been provided regarding the importance of this vaccine. Advised may receive this vaccine at local pharmacy or Health Dept. Aware to provide a copy of the vaccination record if obtained from local pharmacy or Health Dept. Verbalized acceptance and understanding.  Flu Vaccine  status: Due, Education has been provided regarding the importance of this vaccine. Advised may receive this vaccine at local pharmacy or Health Dept. Aware to provide a copy of the vaccination record if obtained from local pharmacy or Health Dept. Verbalized acceptance and understanding.  Pneumococcal vaccine status: Due, Education has been provided regarding the importance of this vaccine. Advised may receive this vaccine at local pharmacy or Health Dept. Aware to provide a copy of the vaccination record if obtained from local pharmacy or Health Dept. Verbalized acceptance and understanding.  Covid-19 vaccine status: Declined, Education has been provided regarding the importance of this vaccine but patient still declined. Advised may receive this vaccine at local pharmacy or Health Dept.or vaccine clinic. Aware to provide a copy of the vaccination record if obtained from local pharmacy or Health Dept. Verbalized acceptance and understanding.  Qualifies for Shingles Vaccine? Yes   Zostavax completed No   Shingrix Completed?: No.    Education has been provided regarding the importance of this vaccine. Patient has been advised to call insurance company to determine out of pocket expense if they have not yet received this vaccine. Advised may also receive vaccine at local pharmacy or Health Dept. Verbalized acceptance and understanding.  Screening Tests Health Maintenance  Topic Date Due  . COVID-19 Vaccine (1) 11/07/2020 (Originally 07/10/1957)  . DEXA SCAN  10/22/2021 (Originally 07/10/2009)  . TETANUS/TDAP  10/22/2021 (Originally 07/11/1964)  . PNA vac Low Risk Adult (1 of 2 - PCV13) 10/22/2021 (Originally 07/10/2009)  . INFLUENZA VACCINE  01/12/2021  . HEMOGLOBIN A1C  04/02/2021  . OPHTHALMOLOGY EXAM  04/11/2021  . URINE MICROALBUMIN  06/04/2021  . FOOT EXAM  08/26/2021  . Hepatitis C Screening  Completed  . HPV VACCINES  Aged Out    Health Maintenance  There are no preventive care reminders  to display for this patient.  Colorectal cancer screening: No longer required.   Mammogram status: No longer required due to age.  Dexa: patient declined  Lung Cancer Screening: (Low Dose CT Chest recommended if Age 79-80 years, 30 pack-year  currently smoking OR have quit w/in 15years.) does not qualify.   Lung Cancer Screening Referral:   Additional Screening:  Hepatitis C Screening: does qualify; Completed   Vision Screening: Recommended annual ophthalmology exams for early detection of glaucoma and other disorders of the eye. Is the patient up to date with their annual eye exam?  Yes  Who is the provider or what is the name of the office in which the patient attends annual eye exams? [facility] If pt is not established with a provider, would they like to be referred to a provider to establish care? No .   Dental Screening: Recommended annual dental exams for proper oral hygiene  Community Resource Referral / Chronic Care Management: CRR required this visit?  No   CCM required this visit?  No      Plan:  -Discussed with patient most recent lab results which are essentially within normal limits or stable from prior with the exception of A1c, lipid panel and TSH. A1c has increased 8.9 to 9.9, triglycerides increased from 135 to 157, and TSH is mildly elevated. Free T4 and T3 are within normal range, advised to schedule lab visit in 6 weeks to repeat thyroid labs and determine if treatment adjustments are indicated. -Patient is unsatisfied with Endocrinologist so will continue management of diabetes mellitus and hypothyroidism. Will start SGL2i- Jardiance. Discussed with patient potential side effects. Recent renal function normal. -Continue current medication regimen. -Follow up in 3 months for DM, HLD, thyroid. Lab visit in 6 weeks for thyroid labs.   I have personally reviewed and noted the following in the patient's chart:   . Medical and social history . Use of alcohol,  tobacco or illicit drugs  . Current medications and supplements including opioid prescriptions.  . Functional ability and status . Nutritional status . Physical activity . Advanced directives . List of other physicians . Hospitalizations, surgeries, and ER visits in previous 12 months . Vitals . Screenings to include cognitive, depression, and falls . Referrals and appointments  In addition, I have reviewed and discussed with patient certain preventive protocols, quality metrics, and best practice recommendations. A written personalized care plan for preventive services as well as general preventive health recommendations were provided to patient.

## 2020-10-29 ENCOUNTER — Ambulatory Visit: Payer: Medicare PPO | Admitting: Endocrinology

## 2020-10-31 ENCOUNTER — Telehealth: Payer: Self-pay

## 2020-10-31 NOTE — Telephone Encounter (Signed)
Pt has been changed from Novolin 70/30 FlexPen Relion 100n/ml to Novolog Mix 70/30 FlexPen by Loanne Drilling.

## 2020-11-12 ENCOUNTER — Other Ambulatory Visit: Payer: Self-pay | Admitting: Physician Assistant

## 2020-11-12 DIAGNOSIS — E785 Hyperlipidemia, unspecified: Secondary | ICD-10-CM

## 2020-11-12 DIAGNOSIS — E1169 Type 2 diabetes mellitus with other specified complication: Secondary | ICD-10-CM

## 2020-11-28 ENCOUNTER — Other Ambulatory Visit: Payer: Self-pay | Admitting: Physician Assistant

## 2020-11-28 DIAGNOSIS — E039 Hypothyroidism, unspecified: Secondary | ICD-10-CM

## 2020-11-28 MED ORDER — LEVOTHYROXINE SODIUM 75 MCG PO TABS: 75.0000 ug | ORAL_TABLET | Freq: Every day | ORAL | 0 refills | Status: DC

## 2020-12-04 ENCOUNTER — Other Ambulatory Visit: Payer: Self-pay

## 2020-12-04 DIAGNOSIS — E039 Hypothyroidism, unspecified: Secondary | ICD-10-CM

## 2020-12-05 ENCOUNTER — Other Ambulatory Visit: Payer: Self-pay

## 2020-12-05 ENCOUNTER — Other Ambulatory Visit: Payer: Medicare PPO

## 2020-12-05 DIAGNOSIS — E039 Hypothyroidism, unspecified: Secondary | ICD-10-CM

## 2020-12-06 LAB — T3: T3, Total: 72 ng/dL (ref 71–180)

## 2020-12-06 LAB — T4, FREE: Free T4: 1.43 ng/dL (ref 0.82–1.77)

## 2020-12-06 LAB — TSH: TSH: 2.32 u[IU]/mL (ref 0.450–4.500)

## 2020-12-10 ENCOUNTER — Other Ambulatory Visit: Payer: Self-pay | Admitting: Physician Assistant

## 2020-12-10 DIAGNOSIS — E039 Hypothyroidism, unspecified: Secondary | ICD-10-CM

## 2020-12-10 MED ORDER — LEVOTHYROXINE SODIUM 75 MCG PO TABS: 75.0000 ug | ORAL_TABLET | Freq: Every day | ORAL | 0 refills | Status: DC

## 2020-12-16 ENCOUNTER — Telehealth: Payer: Self-pay | Admitting: Physician Assistant

## 2020-12-16 DIAGNOSIS — B379 Candidiasis, unspecified: Secondary | ICD-10-CM

## 2020-12-16 MED ORDER — FLUCONAZOLE 150 MG PO TABS: 150.0000 mg | ORAL_TABLET | Freq: Once | ORAL | 0 refills | Status: AC

## 2020-12-16 NOTE — Telephone Encounter (Signed)
Pt called office stating she had vaginal yeast and has tried OTC medication to no avail. Per Herb Grays sending Diflucan to [facility]. AS, CMA

## 2021-01-08 ENCOUNTER — Other Ambulatory Visit: Payer: Self-pay | Admitting: Physician Assistant

## 2021-01-08 DIAGNOSIS — J3089 Other allergic rhinitis: Secondary | ICD-10-CM

## 2021-01-14 ENCOUNTER — Other Ambulatory Visit: Payer: Self-pay | Admitting: Physician Assistant

## 2021-01-14 DIAGNOSIS — F4323 Adjustment disorder with mixed anxiety and depressed mood: Secondary | ICD-10-CM

## 2021-01-17 ENCOUNTER — Other Ambulatory Visit: Payer: Self-pay | Admitting: Physician Assistant

## 2021-01-17 DIAGNOSIS — E118 Type 2 diabetes mellitus with unspecified complications: Secondary | ICD-10-CM

## 2021-01-22 ENCOUNTER — Other Ambulatory Visit: Payer: Self-pay

## 2021-01-22 ENCOUNTER — Encounter: Payer: Self-pay | Admitting: Physician Assistant

## 2021-01-22 ENCOUNTER — Ambulatory Visit (INDEPENDENT_AMBULATORY_CARE_PROVIDER_SITE_OTHER): Payer: Medicare PPO | Admitting: Physician Assistant

## 2021-01-22 VITALS — BP 121/71 | HR 86 | Temp 98.3°F | Ht 66.0 in | Wt 173.7 lb

## 2021-01-22 DIAGNOSIS — E1159 Type 2 diabetes mellitus with other circulatory complications: Secondary | ICD-10-CM | POA: Diagnosis not present

## 2021-01-22 DIAGNOSIS — E039 Hypothyroidism, unspecified: Secondary | ICD-10-CM | POA: Diagnosis not present

## 2021-01-22 DIAGNOSIS — E118 Type 2 diabetes mellitus with unspecified complications: Secondary | ICD-10-CM

## 2021-01-22 DIAGNOSIS — F4323 Adjustment disorder with mixed anxiety and depressed mood: Secondary | ICD-10-CM | POA: Diagnosis not present

## 2021-01-22 DIAGNOSIS — I152 Hypertension secondary to endocrine disorders: Secondary | ICD-10-CM

## 2021-01-22 DIAGNOSIS — E785 Hyperlipidemia, unspecified: Secondary | ICD-10-CM

## 2021-01-22 DIAGNOSIS — E1169 Type 2 diabetes mellitus with other specified complication: Secondary | ICD-10-CM

## 2021-01-22 LAB — POCT GLYCOSYLATED HEMOGLOBIN (HGB A1C): Hemoglobin A1C: 8.9 % — AB (ref 4.0–5.6)

## 2021-01-22 MED ORDER — EMPAGLIFLOZIN 25 MG PO TABS: 25.0000 mg | ORAL_TABLET | Freq: Every day | ORAL | 0 refills | Status: DC

## 2021-01-22 MED ORDER — NOVOLIN 70/30 FLEXPEN RELION (70-30) 100 UNIT/ML ~~LOC~~ SUPN: 30.0000 [IU] | PEN_INJECTOR | Freq: Every day | SUBCUTANEOUS | 11 refills | Status: DC

## 2021-01-22 NOTE — Patient Instructions (Signed)
Diabetes Mellitus and Nutrition, Adult When you have diabetes, or diabetes mellitus, it is very important to have healthy eating habits because your blood sugar (glucose) levels are greatly affected by what you eat and drink. Eating healthy foods in the right amounts, at about the same times every day, can help you:  Control your blood glucose.  Lower your risk of heart disease.  Improve your blood pressure.  Reach or maintain a healthy weight. What can affect my meal plan? Every person with diabetes is different, and each person has different needs for a meal plan. Your health care provider may recommend that you work with a dietitian to make a meal plan that is best for you. Your meal plan may vary depending on factors such as:  The calories you need.  The medicines you take.  Your weight.  Your blood glucose, blood pressure, and cholesterol levels.  Your activity level.  Other health conditions you have, such as heart or kidney disease. How do carbohydrates affect me? Carbohydrates, also called carbs, affect your blood glucose level more than any other type of food. Eating carbs naturally raises the amount of glucose in your blood. Carb counting is a method for keeping track of how many carbs you eat. Counting carbs is important to keep your blood glucose at a healthy level, especially if you use insulin or take certain oral diabetes medicines. It is important to know how many carbs you can safely have in each meal. This is different for every person. Your dietitian can help you calculate how many carbs you should have at each meal and for each snack. How does alcohol affect me? Alcohol can cause a sudden decrease in blood glucose (hypoglycemia), especially if you use insulin or take certain oral diabetes medicines. Hypoglycemia can be a life-threatening condition. Symptoms of hypoglycemia, such as sleepiness, dizziness, and confusion, are similar to symptoms of having too much  alcohol.  Do not drink alcohol if: ? Your health care provider tells you not to drink. ? You are pregnant, may be pregnant, or are planning to become pregnant.  If you drink alcohol: ? Do not drink on an empty stomach. ? Limit how much you use to:  0-1 drink a day for women.  0-2 drinks a day for men. ? Be aware of how much alcohol is in your drink. In the [place] one drink equals one 12 oz bottle of beer (355 mL), one 5 oz glass of wine (148 mL), or one 1 oz glass of hard liquor (44 mL). ? Keep yourself hydrated with water, diet soda, or unsweetened iced tea.  Keep in mind that regular soda, juice, and other mixers may contain a lot of sugar and must be counted as carbs. What are tips for following this plan? Reading food labels  Start by checking the serving size on the "Nutrition Facts" label of packaged foods and drinks. The amount of calories, carbs, fats, and other nutrients listed on the label is based on one serving of the item. Many items contain more than one serving per package.  Check the total grams (g) of carbs in one serving. You can calculate the number of servings of carbs in one serving by dividing the total carbs by 15. For example, if a food has 30 g of total carbs per serving, it would be equal to 2 servings of carbs.  Check the number of grams (g) of saturated fats and trans fats in one serving. Choose foods that have   a low amount or none of these fats.  Check the number of milligrams (mg) of salt (sodium) in one serving. Most people should limit total sodium intake to less than 2,300 mg per day.  Always check the nutrition information of foods labeled as "low-fat" or "nonfat." These foods may be higher in added sugar or refined carbs and should be avoided.  Talk to your dietitian to identify your daily goals for nutrients listed on the label. Shopping  Avoid buying canned, pre-made, or processed foods. These foods tend to be high in fat, sodium, and added  sugar.  Shop around the outside edge of the grocery store. This is where you will most often find fresh fruits and vegetables, bulk grains, fresh meats, and fresh dairy. Cooking  Use low-heat cooking methods, such as baking, instead of high-heat cooking methods like deep frying.  Cook using healthy oils, such as olive, canola, or sunflower oil.  Avoid cooking with butter, cream, or high-fat meats. Meal planning  Eat meals and snacks regularly, preferably at the same times every day. Avoid going long periods of time without eating.  Eat foods that are high in fiber, such as fresh fruits, vegetables, beans, and whole grains. Talk with your dietitian about how many servings of carbs you can eat at each meal.  Eat 4-6 oz (112-168 g) of lean protein each day, such as lean meat, chicken, fish, eggs, or tofu. One ounce (oz) of lean protein is equal to: ? 1 oz (28 g) of meat, chicken, or fish. ? 1 egg. ?  cup (62 g) of tofu.  Eat some foods each day that contain healthy fats, such as avocado, nuts, seeds, and fish.   What foods should I eat? Fruits Berries. Apples. Oranges. Peaches. Apricots. Plums. Grapes. Mango. Papaya. Pomegranate. Kiwi. Cherries. Vegetables Lettuce. Spinach. Leafy greens, including kale, chard, collard greens, and mustard greens. Beets. Cauliflower. Cabbage. Broccoli. Carrots. Green beans. Tomatoes. Peppers. Onions. Cucumbers. Brussels sprouts. Grains Whole grains, such as whole-wheat or whole-grain bread, crackers, tortillas, cereal, and pasta. Unsweetened oatmeal. Quinoa. Brown or wild rice. Meats and other proteins Seafood. Poultry without skin. Lean cuts of poultry and beef. Tofu. Nuts. Seeds. Dairy Low-fat or fat-free dairy products such as milk, yogurt, and cheese. The items listed above may not be a complete list of foods and beverages you can eat. Contact a dietitian for more information. What foods should I avoid? Fruits Fruits canned with  syrup. Vegetables Canned vegetables. Frozen vegetables with butter or cream sauce. Grains Refined white flour and flour products such as bread, pasta, snack foods, and cereals. Avoid all processed foods. Meats and other proteins Fatty cuts of meat. Poultry with skin. Breaded or fried meats. Processed meat. Avoid saturated fats. Dairy Full-fat yogurt, cheese, or milk. Beverages Sweetened drinks, such as soda or iced tea. The items listed above may not be a complete list of foods and beverages you should avoid. Contact a dietitian for more information. Questions to ask a health care provider  Do I need to meet with a diabetes educator?  Do I need to meet with a dietitian?  What number can I call if I have questions?  When are the best times to check my blood glucose? Where to find more information:  American Diabetes Association: diabetes.org  Academy of Nutrition and Dietetics: www.eatright.org  National Institute of Diabetes and Digestive and Kidney Diseases: www.niddk.nih.gov  Association of Diabetes Care and Education Specialists: www.diabeteseducator.org Summary  It is important to have healthy eating   habits because your blood sugar (glucose) levels are greatly affected by what you eat and drink.  A healthy meal plan will help you control your blood glucose and maintain a healthy lifestyle.  Your health care provider may recommend that you work with a dietitian to make a meal plan that is best for you.  Keep in mind that carbohydrates (carbs) and alcohol have immediate effects on your blood glucose levels. It is important to count carbs and to use alcohol carefully. This information is not intended to replace advice given to you by your health care provider. Make sure you discuss any questions you have with your health care provider. Document Revised: 05/08/2019 Document Reviewed: 05/08/2019 Elsevier Patient Education  2021 Elsevier Inc.  

## 2021-01-22 NOTE — Assessment & Plan Note (Signed)
-  A1c has mildly improved from 9.9 to 8.9, discussed with patient increasing Jardiance to 25 mg and patient is agreeable. Discussed with patient if A1c continues to improve and closer to goal (7-7.5) then will consider to discontinue insulin due to side effects. Recommend to continue ambulatory glucose monitoring, especially for hypoglycemia.  -Patient is not on ACEi or ARB for renal protection, will further discuss at follow up visit. -Will continue to monitor.

## 2021-01-22 NOTE — Assessment & Plan Note (Signed)
-  Stable. -PHQ-9 score of 5, GAD-7 score of 0. -Continue current medication regimen.

## 2021-01-22 NOTE — Assessment & Plan Note (Signed)
-  Diet controlled. -Continue ambulatory monitoring. -Will continue to monitor.

## 2021-01-22 NOTE — Assessment & Plan Note (Signed)
-  Last lipid panel: total cholesterol 161, triglycerides 157, HDL 63, LDL 72 -Continue current medication regimen. -Will repeat lipid panel and ALT next follow up visit.

## 2021-01-22 NOTE — Assessment & Plan Note (Signed)
-  Last TSH wnl -Continue current medication regimen. -Will repeat thyroid labs next follow up visit.

## 2021-01-22 NOTE — Progress Notes (Signed)
Established Patient Office Visit  Subjective:  Patient ID: Amanda Douglas, female    DOB: 08/11/1945  Age: 75 y.o. MRN: [id number]  CC:  Chief Complaint  Patient presents with   Diabetes   Hypertension   Hyperlipidemia    HPI [name] presents for follow up on diabetes mellitus, hyperlipidemia, hypertension and hypothyroidism.   Diabetes: Pt denies increased urination or thirst. Pt reports tolerating Jardiance without issues. Has not been taking her insulin daily because of hypoglycemia. Does report one hypoglycemic event when she was at [facility] and had candy to raise her sugar. States has also noticed weight loss since not taking insulin daily. Checking glucose at home. FBS range 140-150. Tries to monitor carbohydrate and glucose intake.   HTN: Pt denies chest pain, palpitations, dizziness or lower extremity swelling. Checks BP at home and readings range <130/80. Pt reports good hydration.  HLD: Pt taking medication as directed without issues. Tries to stay active with house activities. Reports is not able to exercise due to her ankle pain and hernia.  Hypothyroidism: Asymptomatic. Reports medication compliance.  Mood: States mood is stable. Denies mood changes or SI/HI.  Past Medical History:  Diagnosis Date   Depression    Diabetes mellitus without complication ([place])    Hyperlipidemia    Thyroid disease     Past Surgical History:  Procedure Laterality Date   ANKLE FRACTURE SURGERY     CESAREAN SECTION      Family History  Problem Relation Age of Onset   Diabetes Mother    Hypertension Mother    Thyroid disease Mother    Depression Mother    COPD Sister    Arthritis Sister    Healthy Brother    Healthy Daughter    Diabetes Son    Cancer Sister        squamous cell   Hypertension Son     Social History   Socioeconomic History   Marital status: Married    Spouse name: Not on file   Number of children: Not on file   Years of education: Not on  file   Highest education level: Not on file  Occupational History   Not on file  Tobacco Use   Smoking status: Former    Packs/day: 1.00    Years: 15.00    Pack years: 15.00    Types: Cigarettes    Quit date: 06/14/1977    Years since quitting: 43.6   Smokeless tobacco: Never  Vaping Use   Vaping Use: Never used  Substance and Sexual Activity   Alcohol use: No   Drug use: No   Sexual activity: Never  Other Topics Concern   Not on file  Social History Narrative   Not on file   Social Determinants of Health   Financial Resource Strain: Not on file  Food Insecurity: Not on file  Transportation Needs: Not on file  Physical Activity: Not on file  Stress: Not on file  Social Connections: Not on file  Intimate Partner Violence: Not on file    Outpatient Medications Prior to Visit  Medication Sig Dispense Refill   Cholecalciferol (VITAMIN D3) 5000 units CAPS Take 1 capsule by mouth daily.     citalopram (CELEXA) 20 MG tablet TAKE 1 TABLET BY MOUTH DAILY. 90 tablet 1   esomeprazole (NEXIUM) 20 MG capsule Take 20 mg by mouth daily at 12 noon.     fexofenadine (ALLEGRA) 180 MG tablet Take 1 tablet (180 mg total)  by mouth daily. 90 tablet 3   fluticasone (FLONASE) 50 MCG/ACT nasal spray Do 1 spray each nostril twice daily after your sinus rinses 16 g 2   levothyroxine (SYNTHROID) 75 MCG tablet Take 1 tablet (75 mcg total) by mouth daily before breakfast. 90 tablet 0   Lysine 1000 MG TABS Take 1 tablet by mouth daily.     montelukast (SINGULAIR) 10 MG tablet TAKE 1 TABLET BY MOUTH AT BEDTIME. 90 tablet 1   Multiple Vitamin (MULTIVITAMIN) tablet Take 1 tablet by mouth daily.     rosuvastatin (CRESTOR) 20 MG tablet TAKE 1 TABLET (20 MG TOTAL) BY MOUTH AT BEDTIME. 90 tablet 0   insulin isophane & regular human (NOVOLIN 70/30 FLEXPEN RELION) (70-30) 100 UNIT/ML KwikPen Inject 30 Units into the skin daily with breakfast. And pen needles 1/day 15 mL 11   JARDIANCE 10 MG TABS tablet TAKE 1  TABLET (10 MG TOTAL) BY MOUTH DAILY BEFORE BREAKFAST. 90 tablet 0   No facility-administered medications prior to visit.    Allergies  Allergen Reactions   Codeine Nausea And Vomiting   Metformin And Related Diarrhea    ROS Review of Systems A fourteen system review of systems was performed and found to be positive as per HPI.   Objective:    Physical Exam General:  Well Developed, well nourished, in no acute distress  Neuro:  Alert and oriented,  extra-ocular muscles intact  HEENT:  Normocephalic, atraumatic, neck supple Skin:  no gross rash, warm, pink. Cardiac:  RRR, S1 S2 Respiratory:  CTA B/L w/o wheezing, crackles or rales, Not using accessory muscles, speaking in full sentences- unlabored. Vascular:  Ext warm, no cyanosis apprec.; cap RF less 2 sec. Psych:  No HI/SI, judgement and insight good, Euthymic mood. Full Affect.  BP 121/71   Pulse 86   Temp 98.3 F (36.8 C)   Ht '5\' 6"'  (1.676 m)   Wt 173 lb 11.2 oz (78.8 kg)   SpO2 99%   BMI 28.04 kg/m  Wt Readings from Last 3 Encounters:  01/22/21 173 lb 11.2 oz (78.8 kg)  10/22/20 176 lb (79.8 kg)  08/26/20 176 lb 9.6 oz (80.1 kg)     Health Maintenance Due  Topic Date Due   COVID-19 Vaccine (1) Never done   Zoster Vaccines- Shingrix (1 of 2) Never done   INFLUENZA VACCINE  01/12/2021    There are no preventive care reminders to display for this patient.  Lab Results  Component Value Date   TSH 2.320 12/05/2020   Lab Results  Component Value Date   WBC 8.3 10/01/2020   HGB 13.7 10/01/2020   HCT 42.8 10/01/2020   MCV 91 10/01/2020   PLT 298 10/01/2020   Lab Results  Component Value Date   NA 135 10/01/2020   K 5.0 10/01/2020   CO2 22 10/01/2020   GLUCOSE 383 (H) 10/01/2020   BUN 12 10/01/2020   CREATININE 0.86 10/01/2020   BILITOT 0.6 10/01/2020   ALKPHOS 98 10/01/2020   AST 16 10/01/2020   ALT 25 10/01/2020   PROT 6.5 10/01/2020   ALBUMIN 4.5 10/01/2020   CALCIUM 9.1 10/01/2020   EGFR 70  10/01/2020   GFR 70.85 01/10/2020   Lab Results  Component Value Date   CHOL 161 10/01/2020   Lab Results  Component Value Date   HDL 63 10/01/2020   Lab Results  Component Value Date   LDLCALC 72 10/01/2020   Lab Results  Component Value Date  TRIG 157 (H) 10/01/2020   Lab Results  Component Value Date   CHOLHDL 2.6 10/01/2020   Lab Results  Component Value Date   HGBA1C 8.9 (A) 01/22/2021      Assessment & Plan:   Problem List Items Addressed This Visit       Cardiovascular and Mediastinum   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 -Continue ambulatory monitoring. -Will continue to monitor.      Relevant Medications   insulin isophane & regular human KwikPen (NOVOLIN 70/30 KWIKPEN) (70-30) 100 UNIT/ML KwikPen   empagliflozin (JARDIANCE) 25 MG TABS tablet     Endocrine   Hyperlipidemia associated with type 2 diabetes mellitus (HCC) (Chronic)    -Last lipid panel: total cholesterol 161, triglycerides 157, HDL 63, LDL 72 -Continue current medication regimen. -Will repeat lipid panel and ALT next follow up visit.      Relevant Medications   insulin isophane & regular human KwikPen (NOVOLIN 70/30 KWIKPEN) (70-30) 100 UNIT/ML KwikPen   empagliflozin (JARDIANCE) 25 MG TABS tablet   Type 2 diabetes mellitus with complication (HCC) - Primary (Chronic)    -A1c has mildly improved from 9.9 to 8.9, discussed with patient increasing Jardiance to 25 mg and patient is agreeable. Discussed with patient if A1c continues to improve and closer to goal (7-7.5) then will consider to discontinue insulin due to side effects. Recommend to continue ambulatory glucose monitoring, especially for hypoglycemia.  -Patient is not on ACEi or ARB for renal protection, will further discuss at follow up visit. -Will continue to monitor.      Relevant Medications   insulin isophane & regular human KwikPen (NOVOLIN 70/30 KWIKPEN) (70-30) 100 UNIT/ML  KwikPen   empagliflozin (JARDIANCE) 25 MG TABS tablet   Other Relevant Orders   POCT glycosylated hemoglobin (Hb A1C) (Completed)   Hypothyroidism (Chronic)    -Last TSH wnl -Continue current medication regimen. -Will repeat thyroid labs next follow up visit.         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 -PHQ-9 score of 5, GAD-7 score of 0. -Continue current medication regimen.        Meds ordered this encounter  Medications   insulin isophane & regular human KwikPen (NOVOLIN 70/30 KWIKPEN) (70-30) 100 UNIT/ML KwikPen    Sig: Inject 30 Units into the skin daily with breakfast. And pen needles 1/day    Dispense:  15 mL    Refill:  11   empagliflozin (JARDIANCE) 25 MG TABS tablet    Sig: Take 1 tablet (25 mg total) by mouth daily before breakfast.    Dispense:  90 tablet    Refill:  0    Order Specific Question:   Supervising Provider    Answer:   Beatrice Lecher D [[id number]    Follow-up: Return in about 3 months (around 04/24/2021) for DM, HTN, HLD and FBW.    Lorrene Reid, PA-C

## 2021-03-03 ENCOUNTER — Other Ambulatory Visit: Payer: Self-pay | Admitting: Physician Assistant

## 2021-03-03 DIAGNOSIS — E785 Hyperlipidemia, unspecified: Secondary | ICD-10-CM

## 2021-03-03 DIAGNOSIS — E1169 Type 2 diabetes mellitus with other specified complication: Secondary | ICD-10-CM

## 2021-04-17 ENCOUNTER — Other Ambulatory Visit: Payer: Self-pay | Admitting: Physician Assistant

## 2021-04-17 DIAGNOSIS — E118 Type 2 diabetes mellitus with unspecified complications: Secondary | ICD-10-CM

## 2021-04-23 ENCOUNTER — Other Ambulatory Visit: Payer: Self-pay

## 2021-04-23 DIAGNOSIS — E039 Hypothyroidism, unspecified: Secondary | ICD-10-CM

## 2021-04-23 DIAGNOSIS — E1169 Type 2 diabetes mellitus with other specified complication: Secondary | ICD-10-CM

## 2021-04-23 DIAGNOSIS — E118 Type 2 diabetes mellitus with unspecified complications: Secondary | ICD-10-CM

## 2021-04-23 DIAGNOSIS — I152 Hypertension secondary to endocrine disorders: Secondary | ICD-10-CM

## 2021-04-24 ENCOUNTER — Other Ambulatory Visit: Payer: Self-pay

## 2021-04-24 ENCOUNTER — Other Ambulatory Visit: Payer: Medicare PPO

## 2021-04-24 DIAGNOSIS — E785 Hyperlipidemia, unspecified: Secondary | ICD-10-CM | POA: Diagnosis not present

## 2021-04-24 DIAGNOSIS — E1169 Type 2 diabetes mellitus with other specified complication: Secondary | ICD-10-CM | POA: Diagnosis not present

## 2021-04-24 DIAGNOSIS — E1159 Type 2 diabetes mellitus with other circulatory complications: Secondary | ICD-10-CM

## 2021-04-24 DIAGNOSIS — I152 Hypertension secondary to endocrine disorders: Secondary | ICD-10-CM | POA: Diagnosis not present

## 2021-04-24 DIAGNOSIS — E039 Hypothyroidism, unspecified: Secondary | ICD-10-CM

## 2021-04-24 DIAGNOSIS — E118 Type 2 diabetes mellitus with unspecified complications: Secondary | ICD-10-CM | POA: Diagnosis not present

## 2021-04-25 LAB — COMPREHENSIVE METABOLIC PANEL
ALT: 37 IU/L — ABNORMAL HIGH (ref 0–32)
AST: 27 IU/L (ref 0–40)
Albumin/Globulin Ratio: 2.2 (ref 1.2–2.2)
Albumin: 4.3 g/dL (ref 3.7–4.7)
Alkaline Phosphatase: 81 IU/L (ref 44–121)
BUN/Creatinine Ratio: 26 (ref 12–28)
BUN: 20 mg/dL (ref 8–27)
Bilirubin Total: 0.5 mg/dL (ref 0.0–1.2)
CO2: 21 mmol/L (ref 20–29)
Calcium: 9.1 mg/dL (ref 8.7–10.3)
Chloride: 100 mmol/L (ref 96–106)
Creatinine, Ser: 0.76 mg/dL (ref 0.57–1.00)
Globulin, Total: 2 g/dL (ref 1.5–4.5)
Glucose: 178 mg/dL — ABNORMAL HIGH (ref 70–99)
Potassium: 4.3 mmol/L (ref 3.5–5.2)
Sodium: 140 mmol/L (ref 134–144)
Total Protein: 6.3 g/dL (ref 6.0–8.5)
eGFR: 81 mL/min/{1.73_m2} (ref >59)

## 2021-04-25 LAB — CBC WITH DIFFERENTIAL/PLATELET
Basophils Absolute: 0.1 10*3/uL (ref 0.0–0.2)
Basos: 1 %
EOS (ABSOLUTE): 0.1 10*3/uL (ref 0.0–0.4)
Eos: 1 %
Hematocrit: 41.5 % (ref 34.0–46.6)
Hemoglobin: 13.5 g/dL (ref 11.1–15.9)
Immature Grans (Abs): 0 10*3/uL (ref 0.0–0.1)
Immature Granulocytes: 0 %
Lymphocytes Absolute: 1.9 10*3/uL (ref 0.7–3.1)
Lymphs: 27 %
MCH: 29.7 pg (ref 26.6–33.0)
MCHC: 32.5 g/dL (ref 31.5–35.7)
MCV: 91 fL (ref 79–97)
Monocytes Absolute: 0.7 10*3/uL (ref 0.1–0.9)
Monocytes: 9 %
Neutrophils Absolute: 4.4 10*3/uL (ref 1.4–7.0)
Neutrophils: 62 %
Platelets: 273 10*3/uL (ref 150–450)
RBC: 4.55 x10E6/uL (ref 3.77–5.28)
RDW: 13.9 % (ref 11.7–15.4)
WBC: 7 10*3/uL (ref 3.4–10.8)

## 2021-04-25 LAB — LIPID PANEL
Chol/HDL Ratio: 3.1 ratio (ref 0.0–4.4)
Cholesterol, Total: 169 mg/dL (ref 100–199)
HDL: 54 mg/dL (ref >39)
LDL Chol Calc (NIH): 86 mg/dL (ref 0–99)
Triglycerides: 173 mg/dL — ABNORMAL HIGH (ref 0–149)
VLDL Cholesterol Cal: 29 mg/dL (ref 5–40)

## 2021-04-25 LAB — TSH: TSH: 5.43 u[IU]/mL — ABNORMAL HIGH (ref 0.450–4.500)

## 2021-04-25 LAB — HEMOGLOBIN A1C
Est. average glucose Bld gHb Est-mCnc: 266 mg/dL
Hgb A1c MFr Bld: 10.9 % — ABNORMAL HIGH (ref 4.8–5.6)

## 2021-04-27 ENCOUNTER — Ambulatory Visit (INDEPENDENT_AMBULATORY_CARE_PROVIDER_SITE_OTHER): Payer: Medicare PPO | Admitting: Physician Assistant

## 2021-04-27 ENCOUNTER — Other Ambulatory Visit: Payer: Self-pay

## 2021-04-27 ENCOUNTER — Encounter: Payer: Self-pay | Admitting: Physician Assistant

## 2021-04-27 VITALS — BP 126/72 | HR 80 | Temp 98.3°F | Ht 66.0 in | Wt 173.0 lb

## 2021-04-27 DIAGNOSIS — I152 Hypertension secondary to endocrine disorders: Secondary | ICD-10-CM

## 2021-04-27 DIAGNOSIS — E785 Hyperlipidemia, unspecified: Secondary | ICD-10-CM

## 2021-04-27 DIAGNOSIS — E1159 Type 2 diabetes mellitus with other circulatory complications: Secondary | ICD-10-CM | POA: Diagnosis not present

## 2021-04-27 DIAGNOSIS — E1169 Type 2 diabetes mellitus with other specified complication: Secondary | ICD-10-CM

## 2021-04-27 DIAGNOSIS — E039 Hypothyroidism, unspecified: Secondary | ICD-10-CM | POA: Diagnosis not present

## 2021-04-27 DIAGNOSIS — E118 Type 2 diabetes mellitus with unspecified complications: Secondary | ICD-10-CM

## 2021-04-27 MED ORDER — OZEMPIC (0.25 OR 0.5 MG/DOSE) 2 MG/1.5ML ~~LOC~~ SOPN: PEN_INJECTOR | SUBCUTANEOUS | 3 refills | Status: DC

## 2021-04-27 NOTE — Assessment & Plan Note (Addendum)
-  Diet controlled.  -Will continue to monitor. -Recent CMP, renal function normal.

## 2021-04-27 NOTE — Assessment & Plan Note (Signed)
-  Discussed recent lipid panel, triglycerides elevated and LDL 86 (goal <70). Recent ALT mildly elevated at 37, previously normal so will continue current medication regimen. Will repeat hepatic function at follow up visit. -Recommend to follow low fat and carbohydrate diet. -Will continue to monitor.

## 2021-04-27 NOTE — Assessment & Plan Note (Signed)
-  Recent TSH 5.430, mildly elevated, asymptomatic. Recommend to continue levothyroxine 75 mcg. Will repeat thyroid labs in 6 weeks. Pending lab results will adjust medication dose if indicated.

## 2021-04-27 NOTE — Patient Instructions (Signed)

## 2021-04-27 NOTE — Assessment & Plan Note (Signed)
-  Uncontrolled, A1c increased from 8.9 to 10.9. Discussed with patient treatment adjustments and prefers to start GLP-1 therapy with Ozempic instead of restarting Novolin. No personal or family hx of MTC or MEN. Discussed potential side effects and advised to let me know if unable to tolerate medication and will restart Novolin at 20 units and gradually titrate to 30 units if needed.  -Recommend to continue with ambulatory glucose monitoring. -Continue to monitor/reduce simple carbohydrates and glucose intake. -Will repeat A1c and reassess medication therapy in 3 months.

## 2021-04-27 NOTE — Progress Notes (Signed)
Established Patient Office Visit  Subjective:  Patient ID: Amanda Douglas, female    DOB: 1946/03/06  Age: 75 y.o. MRN: [id number]  CC:  Chief Complaint  Patient presents with   Follow-up   Diabetes   Hypertension   Hyperlipidemia    HPI [name] presents for follow up on diabetes mellitus, hyperlipidemia and hypothyroidism.  Diabetes: Pt denies increased urination or thirst. Pt reports medication compliance with Jardiance. States has not been taking Novolin because it was causing her to have low blood sugars. No recent hypoglycemic events. Checking glucose at home. FBS range 140-180. Denies increased carbohydrate or glucose intake. Denies increased stress or corticosteroid therapy.  HLD: Pt taking medication as directed without issues. Tries to have a balanced diet.  Hypothyroidism: Reports medication compliance. Denies fatigue, heat/cold intolerance or weight changes.    Past Medical History:  Diagnosis Date   Depression    Diabetes mellitus without complication ([place])    Hyperlipidemia    Thyroid disease     Past Surgical History:  Procedure Laterality Date   ANKLE FRACTURE SURGERY     CESAREAN SECTION      Family History  Problem Relation Age of Onset   Diabetes Mother    Hypertension Mother    Thyroid disease Mother    Depression Mother    COPD Sister    Arthritis Sister    Healthy Brother    Healthy Daughter    Diabetes Son    Cancer Sister        squamous cell   Hypertension Son     Social History   Socioeconomic History   Marital status: Married    Spouse name: Not on file   Number of children: Not on file   Years of education: Not on file   Highest education level: Not on file  Occupational History   Not on file  Tobacco Use   Smoking status: Former    Packs/day: 1.00    Years: 15.00    Pack years: 15.00    Types: Cigarettes    Quit date: 06/14/1977    Years since quitting: 43.8   Smokeless tobacco: Never  Vaping Use   Vaping  Use: Never used  Substance and Sexual Activity   Alcohol use: No   Drug use: No   Sexual activity: Never  Other Topics Concern   Not on file  Social History Narrative   Not on file   Social Determinants of Health   Financial Resource Strain: Not on file  Food Insecurity: Not on file  Transportation Needs: Not on file  Physical Activity: Not on file  Stress: Not on file  Social Connections: Not on file  Intimate Partner Violence: Not on file    Outpatient Medications Prior to Visit  Medication Sig Dispense Refill   Cholecalciferol (VITAMIN D3) 5000 units CAPS Take 1 capsule by mouth daily.     citalopram (CELEXA) 20 MG tablet TAKE 1 TABLET BY MOUTH DAILY. 90 tablet 1   esomeprazole (NEXIUM) 20 MG capsule Take 20 mg by mouth daily at 12 noon.     fexofenadine (ALLEGRA) 180 MG tablet Take 1 tablet (180 mg total) by mouth daily. 90 tablet 3   fluticasone (FLONASE) 50 MCG/ACT nasal spray Do 1 spray each nostril twice daily after your sinus rinses 16 g 2   insulin isophane & regular human KwikPen (NOVOLIN 70/30 KWIKPEN) (70-30) 100 UNIT/ML KwikPen Inject 30 Units into the skin daily with breakfast. And pen  needles 1/day 15 mL 11   JARDIANCE 25 MG TABS tablet TAKE 1 TABLET (25 MG TOTAL) BY MOUTH DAILY BEFORE BREAKFAST. 90 tablet 0   levothyroxine (SYNTHROID) 75 MCG tablet Take 1 tablet (75 mcg total) by mouth daily before breakfast. 90 tablet 0   Lysine 1000 MG TABS Take 1 tablet by mouth daily.     montelukast (SINGULAIR) 10 MG tablet TAKE 1 TABLET BY MOUTH AT BEDTIME. 90 tablet 1   Multiple Vitamin (MULTIVITAMIN) tablet Take 1 tablet by mouth daily.     rosuvastatin (CRESTOR) 20 MG tablet TAKE 1 TABLET (20 MG TOTAL) BY MOUTH AT BEDTIME. 90 tablet 0   No facility-administered medications prior to visit.    Allergies  Allergen Reactions   Codeine Nausea And Vomiting   Metformin And Related Diarrhea    ROS Review of Systems Review of Systems:  A fourteen system review of  systems was performed and found to be positive as per HPI.   Objective:    Physical Exam General:  Well Developed, well nourished, appropriate for stated age, in no acute distress  Neuro:  Alert and oriented,  extra-ocular muscles intact  HEENT:  Normocephalic, atraumatic, neck supple Skin:  no gross rash, warm, pink. Cardiac:  RRR Respiratory: CTA B/L w/o wheezing, crackles or rales, Not using accessory muscles, speaking in full sentences- unlabored. Vascular:  Ext warm, no cyanosis apprec.; cap RF less 2 sec. Psych:  No HI/SI, judgement and insight good, Euthymic mood. Full Affect.  BP 126/72   Pulse 80   Temp 98.3 F (36.8 C)   Ht _0  (1.676 m)   Wt 173 lb (78.5 kg)   SpO2 98%   BMI 27.92 kg/m  Wt Readings from Last 3 Encounters:  04/27/21 173 lb (78.5 kg)  01/22/21 173 lb 11.2 oz (78.8 kg)  10/22/20 176 lb (79.8 kg)     Health Maintenance Due  Topic Date Due   COVID-19 Vaccine (1) Never done   Pneumonia Vaccine 37+ Years old (1 - PCV) Never done   Zoster Vaccines- Shingrix (1 of 2) Never done   INFLUENZA VACCINE  Never done   OPHTHALMOLOGY EXAM  04/11/2021   URINE MICROALBUMIN  06/04/2021    There are no preventive care reminders to display for this patient.  Lab Results  Component Value Date   TSH 5.430 (H) 04/24/2021   Lab Results  Component Value Date   WBC 7.0 04/24/2021   HGB 13.5 04/24/2021   HCT 41.5 04/24/2021   MCV 91 04/24/2021   PLT 273 04/24/2021   Lab Results  Component Value Date   NA 140 04/24/2021   K 4.3 04/24/2021   CO2 21 04/24/2021   GLUCOSE 178 (H) 04/24/2021   BUN 20 04/24/2021   CREATININE 0.76 04/24/2021   BILITOT 0.5 04/24/2021   ALKPHOS 81 04/24/2021   AST 27 04/24/2021   ALT 37 (H) 04/24/2021   PROT 6.3 04/24/2021   ALBUMIN 4.3 04/24/2021   CALCIUM 9.1 04/24/2021   EGFR 81 04/24/2021   GFR 70.85 01/10/2020   Lab Results  Component Value Date   CHOL 169 04/24/2021   Lab Results  Component Value Date   HDL 54  04/24/2021   Lab Results  Component Value Date   LDLCALC 86 04/24/2021   Lab Results  Component Value Date   TRIG 173 (H) 04/24/2021   Lab Results  Component Value Date   CHOLHDL 3.1 04/24/2021   Lab Results  Component Value Date  HGBA1C 10.9 (H) 04/24/2021      Assessment & Plan:   Problem List Items Addressed This Visit       Cardiovascular and Mediastinum   Hypertension associated with diabetes ([place]) (Chronic)    -Diet controlled.  -Will continue to monitor. -Recent CMP, renal function normal.      Relevant Medications   Semaglutide,0.25 or 0.5MG/DOS, (OZEMPIC, 0.25 OR 0.5 MG/DOSE,) 2 MG/1.5ML SOPN     Endocrine   Hyperlipidemia associated with type 2 diabetes mellitus (HCC) (Chronic)    -Discussed recent lipid panel, triglycerides elevated and LDL 86 (goal <70). Recent ALT mildly elevated at 37, previously normal so will continue current medication regimen. Will repeat hepatic function at follow up visit. -Recommend to follow low fat and carbohydrate diet. -Will continue to monitor.      Relevant Medications   Semaglutide,0.25 or 0.5MG/DOS, (OZEMPIC, 0.25 OR 0.5 MG/DOSE,) 2 MG/1.5ML SOPN   Type 2 diabetes mellitus with complication (HCC) - Primary (Chronic)    -Uncontrolled, A1c increased from 8.9 to 10.9. Discussed with patient treatment adjustments and prefers to start GLP-1 therapy with Ozempic instead of restarting Novolin. No personal or family hx of MTC or MEN. Discussed potential side effects and advised to let me know if unable to tolerate medication and will restart Novolin at 20 units and gradually titrate to 30 units if needed.  -Recommend to continue with ambulatory glucose monitoring. -Continue to monitor/reduce simple carbohydrates and glucose intake. -Will repeat A1c and reassess medication therapy in 3 months.      Relevant Medications   Semaglutide,0.25 or 0.5MG/DOS, (OZEMPIC, 0.25 OR 0.5 MG/DOSE,) 2 MG/1.5ML SOPN   Hypothyroidism (Chronic)     -Recent TSH 5.430, mildly elevated, asymptomatic. Recommend to continue levothyroxine 75 mcg. Will repeat thyroid labs in 6 weeks. Pending lab results will adjust medication dose if indicated.       Meds ordered this encounter  Medications   Semaglutide,0.25 or 0.5MG/DOS, (OZEMPIC, 0.25 OR 0.5 MG/DOSE,) 2 MG/1.5ML SOPN    Sig: Inject 0.25 mg into the skin once weekly x 4 weeks. Then inject 0.5 mg into the skin once weekly.    Dispense:  1.5 mL    Refill:  3    Order Specific Question:   Supervising Provider    Answer:   Beatrice Lecher D [[id number]     Follow-up: Return in about 3 months (around 07/28/2021) for DM, HTN, HLD; lab visit in 6 weeks for thyroid labs (TSH, free T4).    Lorrene Reid, PA-C

## 2021-05-19 ENCOUNTER — Telehealth: Payer: Self-pay | Admitting: Physician Assistant

## 2021-05-19 NOTE — Telephone Encounter (Signed)
Patient called and requested something for acid reflux. States she has been taking an otc medication but it isn't helping. Please advise. [phone]

## 2021-05-19 NOTE — Telephone Encounter (Signed)
Patient is notified and appointment is made to see [facility].

## 2021-05-28 ENCOUNTER — Encounter: Payer: Self-pay | Admitting: Physician Assistant

## 2021-05-28 ENCOUNTER — Ambulatory Visit (INDEPENDENT_AMBULATORY_CARE_PROVIDER_SITE_OTHER): Payer: Medicare PPO | Admitting: Physician Assistant

## 2021-05-28 ENCOUNTER — Other Ambulatory Visit: Payer: Self-pay

## 2021-05-28 VITALS — BP 137/77 | HR 86 | Temp 97.3°F | Ht 66.0 in | Wt 166.0 lb

## 2021-05-28 DIAGNOSIS — K219 Gastro-esophageal reflux disease without esophagitis: Secondary | ICD-10-CM | POA: Diagnosis not present

## 2021-05-28 MED ORDER — PANTOPRAZOLE SODIUM 20 MG PO TBEC
20.0000 mg | DELAYED_RELEASE_TABLET | Freq: Every day | ORAL | 0 refills | Status: DC
Start: 2021-05-28 — End: 2023-03-09

## 2021-05-28 MED ORDER — SUCRALFATE 1 G PO TABS
1.0000 g | ORAL_TABLET | Freq: Three times a day (TID) | ORAL | 0 refills | Status: AC | PRN
Start: 2021-05-28 — End: ?

## 2021-05-28 NOTE — Patient Instructions (Signed)

## 2021-05-28 NOTE — Progress Notes (Signed)
Established Patient Office Visit  Subjective:  Patient ID: Amanda Douglas, female    DOB: August 13, 1945  Age: 75 y.o. MRN: [id number]  CC:  Chief Complaint  Patient presents with   Follow-up   Gastroesophageal Reflux    HPI [place] presents for discussion of changing reflux medication. Reports experiencing increased heartburn symptoms and acid reflux the few weeks ago which have mildly improved over the last couple of days. States when laying down at night has burning sensation in her throat and bad reflux. Tries to avoid food triggers she knows such as chocolate or foods with a tomato base. In the past has tried omeprazole and famotidine which have been ineffective. Has been taking generic Nexium (OTC) which was helping but not as much recently. Patient does have hx of hiatal hernia. Denies melena, hematochezia, hemoptysis, fever, night sweats or unintentional weight loss.    Past Medical History:  Diagnosis Date   Depression    Diabetes mellitus without complication ([place])    Hyperlipidemia    Thyroid disease     Past Surgical History:  Procedure Laterality Date   ANKLE FRACTURE SURGERY     CESAREAN SECTION      Family History  Problem Relation Age of Onset   Diabetes Mother    Hypertension Mother    Thyroid disease Mother    Depression Mother    COPD Sister    Arthritis Sister    Healthy Brother    Healthy Daughter    Diabetes Son    Cancer Sister        squamous cell   Hypertension Son     Social History   Socioeconomic History   Marital status: Married    Spouse name: Not on file   Number of children: Not on file   Years of education: Not on file   Highest education level: Not on file  Occupational History   Not on file  Tobacco Use   Smoking status: Former    Packs/day: 1.00    Years: 15.00    Pack years: 15.00    Types: Cigarettes    Quit date: 06/14/1977    Years since quitting: 43.9   Smokeless tobacco: Never  Vaping Use   Vaping Use:  Never used  Substance and Sexual Activity   Alcohol use: No   Drug use: No   Sexual activity: Never  Other Topics Concern   Not on file  Social History Narrative   Not on file   Social Determinants of Health   Financial Resource Strain: Not on file  Food Insecurity: Not on file  Transportation Needs: Not on file  Physical Activity: Not on file  Stress: Not on file  Social Connections: Not on file  Intimate Partner Violence: Not on file    Outpatient Medications Prior to Visit  Medication Sig Dispense Refill   Cholecalciferol (VITAMIN D3) 5000 units CAPS Take 1 capsule by mouth daily.     citalopram (CELEXA) 20 MG tablet TAKE 1 TABLET BY MOUTH DAILY. 90 tablet 1   fexofenadine (ALLEGRA) 180 MG tablet Take 1 tablet (180 mg total) by mouth daily. 90 tablet 3   fluticasone (FLONASE) 50 MCG/ACT nasal spray Do 1 spray each nostril twice daily after your sinus rinses 16 g 2   insulin isophane & regular human KwikPen (NOVOLIN 70/30 KWIKPEN) (70-30) 100 UNIT/ML KwikPen Inject 30 Units into the skin daily with breakfast. And pen needles 1/day 15 mL 11   JARDIANCE 25  MG TABS tablet TAKE 1 TABLET (25 MG TOTAL) BY MOUTH DAILY BEFORE BREAKFAST. 90 tablet 0   levothyroxine (SYNTHROID) 75 MCG tablet Take 1 tablet (75 mcg total) by mouth daily before breakfast. 90 tablet 0   Lysine 1000 MG TABS Take 1 tablet by mouth daily.     montelukast (SINGULAIR) 10 MG tablet TAKE 1 TABLET BY MOUTH AT BEDTIME. 90 tablet 1   Multiple Vitamin (MULTIVITAMIN) tablet Take 1 tablet by mouth daily.     rosuvastatin (CRESTOR) 20 MG tablet TAKE 1 TABLET (20 MG TOTAL) BY MOUTH AT BEDTIME. 90 tablet 0   Semaglutide,0.25 or 0.5MG/DOS, (OZEMPIC, 0.25 OR 0.5 MG/DOSE,) 2 MG/1.5ML SOPN Inject 0.25 mg into the skin once weekly x 4 weeks. Then inject 0.5 mg into the skin once weekly. 1.5 mL 3   esomeprazole (NEXIUM) 20 MG capsule Take 20 mg by mouth daily at 12 noon.     No facility-administered medications prior to visit.     Allergies  Allergen Reactions   Codeine Nausea And Vomiting   Metformin And Related Diarrhea    ROS Review of Systems Review of Systems:  A fourteen system review of systems was performed and found to be positive as per HPI.   Objective:    Physical Exam General:  Well Developed, well nourished, appropriate for stated age.  Neuro:  Alert and oriented,  extra-ocular muscles intact  HEENT:  Normocephalic, atraumatic, neck supple  Skin:  no gross rash, warm, pink. Cardiac:  RRR, S1 S2 Respiratory:  CTA B/L Abdomen: Soft, non-tender, non-distended, +BS Vascular:  Ext warm, no cyanosis apprec.; cap RF less 2 sec. Psych:  No HI/SI, judgement and insight good, Euthymic mood. Full Affect.  BP 137/77    Pulse 86    Temp (!) 97.3 F (36.3 C)    Ht _0  (1.676 m)    Wt 166 lb (75.3 kg)    SpO2 99%    BMI 26.79 kg/m  Wt Readings from Last 3 Encounters:  05/28/21 166 lb (75.3 kg)  04/27/21 173 lb (78.5 kg)  01/22/21 173 lb 11.2 oz (78.8 kg)     Health Maintenance Due  Topic Date Due   COVID-19 Vaccine (1) Never done   Pneumonia Vaccine 67+ Years old (1 - PCV) Never done   Zoster Vaccines- Shingrix (1 of 2) Never done   INFLUENZA VACCINE  Never done   OPHTHALMOLOGY EXAM  04/11/2021   URINE MICROALBUMIN  06/04/2021    There are no preventive care reminders to display for this patient.  Lab Results  Component Value Date   TSH 5.430 (H) 04/24/2021   Lab Results  Component Value Date   WBC 7.0 04/24/2021   HGB 13.5 04/24/2021   HCT 41.5 04/24/2021   MCV 91 04/24/2021   PLT 273 04/24/2021   Lab Results  Component Value Date   NA 140 04/24/2021   K 4.3 04/24/2021   CO2 21 04/24/2021   GLUCOSE 178 (H) 04/24/2021   BUN 20 04/24/2021   CREATININE 0.76 04/24/2021   BILITOT 0.5 04/24/2021   ALKPHOS 81 04/24/2021   AST 27 04/24/2021   ALT 37 (H) 04/24/2021   PROT 6.3 04/24/2021   ALBUMIN 4.3 04/24/2021   CALCIUM 9.1 04/24/2021   EGFR 81 04/24/2021   GFR 70.85  01/10/2020   Lab Results  Component Value Date   CHOL 169 04/24/2021   Lab Results  Component Value Date   HDL 54 04/24/2021   Lab Results  Component Value Date   LDLCALC 86 04/24/2021   Lab Results  Component Value Date   TRIG 173 (H) 04/24/2021   Lab Results  Component Value Date   CHOLHDL 3.1 04/24/2021   Lab Results  Component Value Date   HGBA1C 10.9 (H) 04/24/2021      Assessment & Plan:   Problem List Items Addressed This Visit       Digestive   Gastroesophageal reflux disease - Primary (Chronic)   Relevant Medications   sucralfate (CARAFATE) 1 g tablet   pantoprazole (PROTONIX) 20 MG tablet   GERD: -Symptoms uncontrolled. Patient has tried and failed omeprazole, famotidine, and esomeprazole. Recommend changing esomeprazole to pantoprazole. Patient is agreeable. Will start sucralfate to take as needed. Discussed hiatal hernia likely contributing to symptoms and consider gastro consult if symptoms fail to improve or worsen. Recommend to avoid provocative foods and elevated the head of the bed.    Meds ordered this encounter  Medications   sucralfate (CARAFATE) 1 g tablet    Sig: Take 1 tablet (1 g total) by mouth 3 (three) times daily as needed.    Dispense:  90 tablet    Refill:  0    Order Specific Question:   Supervising Provider    Answer:   Beatrice Lecher D [[id number]]   pantoprazole (PROTONIX) 20 MG tablet    Sig: Take 1 tablet (20 mg total) by mouth daily.    Dispense:  90 tablet    Refill:  0    Order Specific Question:   Supervising Provider    Answer:   Beatrice Lecher D [[id number]]    Follow-up: Return if symptoms worsen or fail to improve.     Lorrene Reid, PA-C

## 2021-06-04 ENCOUNTER — Other Ambulatory Visit: Payer: Self-pay | Admitting: Physician Assistant

## 2021-06-04 DIAGNOSIS — E1169 Type 2 diabetes mellitus with other specified complication: Secondary | ICD-10-CM

## 2021-06-09 ENCOUNTER — Other Ambulatory Visit: Payer: Self-pay | Admitting: Physician Assistant

## 2021-06-09 DIAGNOSIS — E039 Hypothyroidism, unspecified: Secondary | ICD-10-CM

## 2021-06-10 ENCOUNTER — Other Ambulatory Visit: Payer: Self-pay

## 2021-06-10 ENCOUNTER — Other Ambulatory Visit: Payer: Medicare PPO

## 2021-06-10 DIAGNOSIS — E039 Hypothyroidism, unspecified: Secondary | ICD-10-CM

## 2021-06-11 ENCOUNTER — Telehealth: Payer: Self-pay | Admitting: Physician Assistant

## 2021-06-11 LAB — T4, FREE: Free T4: 1.65 ng/dL (ref 0.82–1.77)

## 2021-06-11 LAB — TSH: TSH: 1.86 u[IU]/mL (ref 0.450–4.500)

## 2021-06-11 NOTE — Telephone Encounter (Signed)
Patient returning your call please call her back at [phone]

## 2021-07-17 ENCOUNTER — Other Ambulatory Visit: Payer: Self-pay | Admitting: Physician Assistant

## 2021-07-17 DIAGNOSIS — E118 Type 2 diabetes mellitus with unspecified complications: Secondary | ICD-10-CM

## 2021-07-27 ENCOUNTER — Other Ambulatory Visit: Payer: Self-pay | Admitting: Physician Assistant

## 2021-07-27 DIAGNOSIS — F4323 Adjustment disorder with mixed anxiety and depressed mood: Secondary | ICD-10-CM

## 2021-07-29 ENCOUNTER — Encounter: Payer: Self-pay | Admitting: Physician Assistant

## 2021-07-29 ENCOUNTER — Other Ambulatory Visit: Payer: Self-pay

## 2021-07-29 ENCOUNTER — Ambulatory Visit (INDEPENDENT_AMBULATORY_CARE_PROVIDER_SITE_OTHER): Payer: Medicare PPO | Admitting: Physician Assistant

## 2021-07-29 VITALS — BP 126/78 | HR 84 | Temp 97.6°F | Ht 66.0 in | Wt 168.0 lb

## 2021-07-29 DIAGNOSIS — E1159 Type 2 diabetes mellitus with other circulatory complications: Secondary | ICD-10-CM

## 2021-07-29 DIAGNOSIS — E1169 Type 2 diabetes mellitus with other specified complication: Secondary | ICD-10-CM

## 2021-07-29 DIAGNOSIS — I152 Hypertension secondary to endocrine disorders: Secondary | ICD-10-CM

## 2021-07-29 DIAGNOSIS — K219 Gastro-esophageal reflux disease without esophagitis: Secondary | ICD-10-CM | POA: Diagnosis not present

## 2021-07-29 DIAGNOSIS — E785 Hyperlipidemia, unspecified: Secondary | ICD-10-CM

## 2021-07-29 DIAGNOSIS — E118 Type 2 diabetes mellitus with unspecified complications: Secondary | ICD-10-CM

## 2021-07-29 LAB — POCT GLYCOSYLATED HEMOGLOBIN (HGB A1C): Hemoglobin A1C: 8.4 % — AB (ref 4.0–5.6)

## 2021-07-29 LAB — POCT UA - MICROALBUMIN
Albumin/Creatinine Ratio, Urine, POC: <30
Creatinine, POC: 100 mg/dL
Microalbumin Ur, POC: 10 mg/L

## 2021-07-29 NOTE — Assessment & Plan Note (Signed)
-  Advised patient if pantoprazole has been ineffective and famotidine has been more helpful then can discontinue pantoprazole and take famotidine 20 mg BID. Discussed hiatal hernia likely contributing to symptoms.

## 2021-07-29 NOTE — Patient Instructions (Signed)

## 2021-07-29 NOTE — Assessment & Plan Note (Signed)
-  Diet controlled. Will continue to monitor. 

## 2021-07-29 NOTE — Assessment & Plan Note (Addendum)
-  A1c today has improved from 10.9 to 8.4, the best so far. Due to cost concerns will continue with current medication regimen. Discussed with patient if A1c is not at goal at follow up visit (<7.5) then recommend increasing Ozempic to 1 mg once a week. Pt verbalized understanding. -Continue to monitor carbohydrate and glucose intake. -Continue ambulatory glucose monitoring. -UA microalbumin, A:C <30 mg/g wnl's. -Will continue to monitor.

## 2021-07-29 NOTE — Progress Notes (Signed)
Established patient visit   Patient: Amanda Douglas   DOB: 05-19-1946   76 y.o. Female  MRN: [id number] Visit Date: 07/29/2021  Chief Complaint  Patient presents with   Follow-up   Diabetes   Hyperlipidemia   Hypertension   Subjective    HPI  Patient presents to follow up on diabetes mellitus, hypertension and hyperlipidemia.  Diabetes mellitus type 2: Pt denies increased urination or thirst. Pt reports medication compliance. No hypoglycemic events. Checking glucose at home. FBS range from 120-130. States has not been eating much potatoes and has been careful with starches and sweets.   HTN: Pt denies chest pain, palpitations, dizziness, shortness of breath, or lower extremity swelling. Does not check BP at home. Pt monitors sodium intake.  HLD: Pt taking medication as directed without issues. Patient reports has been more careful about what she eats especially with heartburn.   GERD: Patient states pantoprazole has not been more effective than other PPI's she has tried. Has tried Pepcid complete which has provided some relief.    Medications: Outpatient Medications Prior to Visit  Medication Sig   Cholecalciferol (VITAMIN D3) 5000 units CAPS Take 1 capsule by mouth daily.   citalopram (CELEXA) 20 MG tablet TAKE 1 TABLET BY MOUTH DAILY.   fexofenadine (ALLEGRA) 180 MG tablet Take 1 tablet (180 mg total) by mouth daily.   JARDIANCE 25 MG TABS tablet TAKE 1 TABLET (25 MG TOTAL) BY MOUTH DAILY BEFORE BREAKFAST.   levothyroxine (SYNTHROID) 75 MCG tablet TAKE 1 TABLET (75 MCG TOTAL) BY MOUTH DAILY BEFORE BREAKFAST.   Lysine 1000 MG TABS Take 1 tablet by mouth daily.   montelukast (SINGULAIR) 10 MG tablet TAKE 1 TABLET BY MOUTH AT BEDTIME.   Multiple Vitamin (MULTIVITAMIN) tablet Take 1 tablet by mouth daily.   pantoprazole (PROTONIX) 20 MG tablet Take 1 tablet (20 mg total) by mouth daily.   rosuvastatin (CRESTOR) 20 MG tablet TAKE 1 TABLET (20 MG TOTAL) BY MOUTH AT BEDTIME.    Semaglutide,0.25 or 0.5MG /DOS, (OZEMPIC, 0.25 OR 0.5 MG/DOSE,) 2 MG/1.5ML SOPN Inject 0.25 mg into the skin once weekly x 4 weeks. Then inject 0.5 mg into the skin once weekly.   sucralfate (CARAFATE) 1 g tablet Take 1 tablet (1 g total) by mouth 3 (three) times daily as needed.   triamcinolone (NASACORT ALLERGY 24HR) 55 MCG/ACT AERO nasal inhaler Place 2 sprays into the nose daily.   [DISCONTINUED] fluticasone (FLONASE) 50 MCG/ACT nasal spray Do 1 spray each nostril twice daily after your sinus rinses   [DISCONTINUED] insulin isophane & regular human KwikPen (NOVOLIN 70/30 KWIKPEN) (70-30) 100 UNIT/ML KwikPen Inject 30 Units into the skin daily with breakfast. And pen needles 1/day   No facility-administered medications prior to visit.    Review of Systems Review of Systems:  A fourteen system review of systems was performed and found to be positive as per HPI.  Last hemoglobin A1c Lab Results  Component Value Date   HGBA1C 8.4 (A) 07/29/2021     Objective    BP 126/78    Pulse 84    Temp 97.6 F (36.4 C)    Ht 5\' 6"  (1.676 m)    Wt 168 lb (76.2 kg)    SpO2 98%    BMI 27.12 kg/m  BP Readings from Last 3 Encounters:  07/29/21 126/78  05/28/21 137/77  04/27/21 126/72   Wt Readings from Last 3 Encounters:  07/29/21 168 lb (76.2 kg)  05/28/21 166 lb (75.3 kg)  04/27/21 173  lb (78.5 kg)    Physical Exam  General:  Pleasant and cooperative, appropriate for stated age.  Neuro:  Alert and oriented,  extra-ocular muscles intact  HEENT:  Normocephalic, atraumatic, neck supple  Skin:  no gross rash, warm, pink. Cardiac:  RRR, S1 S2 Respiratory: CTA B/L w/o wheezing. Vascular:  Ext warm, no cyanosis apprec.; cap RF less 2 sec. Psych:  No HI/SI, judgement and insight good, Euthymic mood. Full Affect.   Results for orders placed or performed in visit on 07/29/21  POCT UA - Microalbumin  Result Value Ref Range   Microalbumin Ur, POC 10 mg/L   Creatinine, POC 100 mg/dL    Albumin/Creatinine Ratio, Urine, POC <30   POCT glycosylated hemoglobin (Hb A1C)  Result Value Ref Range   Hemoglobin A1C 8.4 (A) 4.0 - 5.6 %   HbA1c POC (<> result, manual entry)     HbA1c, POC (prediabetic range)     HbA1c, POC (controlled diabetic range)      Assessment & Plan      Problem List Items Addressed This Visit       Cardiovascular and Mediastinum   Hypertension associated with diabetes ([place]) (Chronic)    -Diet controlled. -Will continue to monitor.        Digestive   Gastroesophageal reflux disease (Chronic)    -Advised patient if pantoprazole has been ineffective and famotidine has been more helpful then can discontinue pantoprazole and take famotidine 20 mg BID. Discussed hiatal hernia likely contributing to symptoms.        Endocrine   Hyperlipidemia associated with type 2 diabetes mellitus ([facility]) (Chronic)    -Last lipid panel: HDL 54, LDL 86 -Continue current medication regimen. Will repeat lipid panel and hepatic function at follow up visit. If LDL remains above goal (<70) then will consider increasing rosuvastatin to 40 mg. -Recommend to continue to monitor/reduce saturated and trans fats. -Will continue to monitor.      Type 2 diabetes mellitus with complication ([facility]) - Primary (Chronic)    -A1c today has improved from 10.9 to 8.4, the best so far. Due to cost concerns will continue with current medication regimen. Discussed with patient if A1c is not at goal at follow up visit (<7.5) then recommend increasing Ozempic to 1 mg once a week. Pt verbalized understanding. -Continue to monitor carbohydrate and glucose intake. -Continue ambulatory glucose monitoring. -UA microalbumin, A:C <30 mg/g wnl's. -Will continue to monitor.      Relevant Orders   POCT UA - Microalbumin (Completed)   POCT glycosylated hemoglobin (Hb A1C) (Completed)    Return in about 3 months (around 10/26/2021) for [place] and FBW.        Lorrene Reid, PA-C  [facility]  Primary Care at [facility] [phone] (phone) [phone] (fax)  [place]

## 2021-07-29 NOTE — Assessment & Plan Note (Signed)
-  Last lipid panel: HDL 54, LDL 86 -Continue current medication regimen. Will repeat lipid panel and hepatic function at follow up visit. If LDL remains above goal (<70) then will consider increasing rosuvastatin to 40 mg. -Recommend to continue to monitor/reduce saturated and trans fats. -Will continue to monitor.

## 2021-09-02 ENCOUNTER — Other Ambulatory Visit: Payer: Self-pay | Admitting: Physician Assistant

## 2021-09-02 DIAGNOSIS — E118 Type 2 diabetes mellitus with unspecified complications: Secondary | ICD-10-CM

## 2021-09-09 ENCOUNTER — Other Ambulatory Visit: Payer: Self-pay | Admitting: Physician Assistant

## 2021-09-09 DIAGNOSIS — E118 Type 2 diabetes mellitus with unspecified complications: Secondary | ICD-10-CM

## 2021-10-28 ENCOUNTER — Ambulatory Visit (INDEPENDENT_AMBULATORY_CARE_PROVIDER_SITE_OTHER): Payer: Medicare PPO | Admitting: Physician Assistant

## 2021-10-28 ENCOUNTER — Encounter: Payer: Self-pay | Admitting: Physician Assistant

## 2021-10-28 ENCOUNTER — Other Ambulatory Visit: Payer: Self-pay | Admitting: Physician Assistant

## 2021-10-28 VITALS — BP 113/71 | HR 98 | Temp 97.7°F | Ht 66.0 in | Wt 160.0 lb

## 2021-10-28 DIAGNOSIS — E039 Hypothyroidism, unspecified: Secondary | ICD-10-CM | POA: Diagnosis not present

## 2021-10-28 DIAGNOSIS — F4323 Adjustment disorder with mixed anxiety and depressed mood: Secondary | ICD-10-CM

## 2021-10-28 DIAGNOSIS — Z0000 Encounter for general adult medical examination without abnormal findings: Secondary | ICD-10-CM | POA: Diagnosis not present

## 2021-10-28 DIAGNOSIS — E118 Type 2 diabetes mellitus with unspecified complications: Secondary | ICD-10-CM | POA: Diagnosis not present

## 2021-10-28 DIAGNOSIS — Z1211 Encounter for screening for malignant neoplasm of colon: Secondary | ICD-10-CM

## 2021-10-28 DIAGNOSIS — K2100 Gastro-esophageal reflux disease with esophagitis, without bleeding: Secondary | ICD-10-CM

## 2021-10-28 DIAGNOSIS — K449 Diaphragmatic hernia without obstruction or gangrene: Secondary | ICD-10-CM | POA: Diagnosis not present

## 2021-10-28 NOTE — Progress Notes (Signed)
? ?Subjective:  ? Amanda Douglas is a 76 y.o. female who presents for Medicare Annual (Subsequent) preventive examination. ? ?Review of Systems    ?General:   No F/C, wt loss ?Pulm:   No DIB, SOB, pleuritic chest pain ?Card:  No CP, palpitations ?Abd:  +n/v, + abd pain, no diarrhea or constipation  ?Ext:  No inc edema from baseline ? ?   ?Objective:  ?  ?Today's Vitals  ? 10/28/21 0926  ?BP: 113/71  ?Pulse: 98  ?Temp: 97.7 ?F (36.5 ?C)  ?SpO2: 99%  ?Weight: 160 lb (72.6 kg)  ?Height: '5\' 6"'$  (1.676 m)  ? ?Body mass index is 25.82 kg/m?. ? ? ?  03/03/2017  ? 10:40 AM 04/19/2016  ? 11:19 AM  ?Advanced Directives  ?Does Patient Have a Medical Advance Directive? No No  ?Would patient like information on creating a medical advance directive? No - Patient declined No - patient declined information  ? ? ?Current Medications (verified) ?Outpatient Encounter Medications as of 10/28/2021  ?Medication Sig  ? Cholecalciferol (VITAMIN D3) 5000 units CAPS Take 1 capsule by mouth daily.  ? fexofenadine (ALLEGRA) 180 MG tablet Take 1 tablet (180 mg total) by mouth daily.  ? JARDIANCE 25 MG TABS tablet TAKE 1 TABLET (25 MG TOTAL) BY MOUTH DAILY BEFORE BREAKFAST.  ? levothyroxine (SYNTHROID) 75 MCG tablet TAKE 1 TABLET (75 MCG TOTAL) BY MOUTH DAILY BEFORE BREAKFAST.  ? Lysine 1000 MG TABS Take 1 tablet by mouth daily.  ? montelukast (SINGULAIR) 10 MG tablet TAKE 1 TABLET BY MOUTH AT BEDTIME.  ? Multiple Vitamin (MULTIVITAMIN) tablet Take 1 tablet by mouth daily.  ? pantoprazole (PROTONIX) 20 MG tablet Take 1 tablet (20 mg total) by mouth daily.  ? rosuvastatin (CRESTOR) 20 MG tablet TAKE 1 TABLET (20 MG TOTAL) BY MOUTH AT BEDTIME.  ? Semaglutide,0.25 or 0.'5MG'$ /DOS, (OZEMPIC, 0.25 OR 0.5 MG/DOSE,) 2 MG/1.5ML SOPN Inject 0.5 mg into the skin once weekly.  ? sucralfate (CARAFATE) 1 g tablet Take 1 tablet (1 g total) by mouth 3 (three) times daily as needed.  ? triamcinolone (NASACORT) 55 MCG/ACT AERO nasal inhaler Place 2 sprays into the  nose daily.  ? [DISCONTINUED] citalopram (CELEXA) 20 MG tablet TAKE 1 TABLET BY MOUTH DAILY.  ? ?No facility-administered encounter medications on file as of 10/28/2021.  ? ? ?Allergies (verified) ?Codeine and Metformin and related  ? ?History: ?Past Medical History:  ?Diagnosis Date  ? Depression   ? Diabetes mellitus without complication ([place])   ? Hyperlipidemia   ? Thyroid disease   ? ?Past Surgical History:  ?Procedure Laterality Date  ? ANKLE FRACTURE SURGERY    ? CESAREAN SECTION    ? ?Family History  ?Problem Relation Age of Onset  ? Diabetes Mother   ? Hypertension Mother   ? Thyroid disease Mother   ? Depression Mother   ? COPD Sister   ? Arthritis Sister   ? Healthy Brother   ? Healthy Daughter   ? Diabetes Son   ? Cancer Sister   ?     squamous cell  ? Hypertension Son   ? ?Social History  ? ?Socioeconomic History  ? Marital status: Married  ?  Spouse name: Not on file  ? Number of children: Not on file  ? Years of education: Not on file  ? Highest education level: Not on file  ?Occupational History  ? Not on file  ?Tobacco Use  ? Smoking status: Former  ?  Packs/day: 1.00  ?  Years: 15.00  ?  Pack years: 15.00  ?  Types: Cigarettes  ?  Quit date: 06/14/1977  ?  Years since quitting: 44.4  ? Smokeless tobacco: Never  ?Vaping Use  ? Vaping Use: Never used  ?Substance and Sexual Activity  ? Alcohol use: No  ? Drug use: No  ? Sexual activity: Never  ?Other Topics Concern  ? Not on file  ?Social History Narrative  ? Not on file  ? ?Social Determinants of Health  ? ?Financial Resource Strain: Not on file  ?Food Insecurity: Not on file  ?Transportation Needs: Not on file  ?Physical Activity: Not on file  ?Stress: Not on file  ?Social Connections: Not on file  ? ? ?Tobacco Counseling ?Counseling given: Not Answered ? ? ?Diabetic?Yes ? ?Activities of Daily Living ? ?  10/28/2021  ?  9:27 AM 07/29/2021  ?  3:09 PM  ?In your present state of health, do you have any difficulty performing the following activities:   ?Hearing? 0 0  ?Vision? 0 0  ?Difficulty concentrating or making decisions? 0 0  ?Walking or climbing stairs? 0 1  ?Dressing or bathing? 0 0  ?Doing errands, shopping? 0 0  ? ? ?Patient Care Team: ?Lorrene Reid, PA-C as PCP - General ?Margaretmary Bayley, MD as Referring Physician (Internal Medicine) ?Gastroenterology, [place] as Consulting Physician (Gastroenterology) ?Renato Shin, MD as Consulting Physician (Endocrinology) ?Everlene Farrier, MD as Consulting Physician (Obstetrics and Gynecology) ? ?Indicate any recent Medical Services you may have received from other than [facility] providers in the past year (date may be approximate). ? ?   ?Assessment:  ? This is a routine wellness examination for Amanda Douglas. ? ?Hearing/Vision screen ?No results found. ? ?Dietary issues and exercise activities discussed: ?-Patient reports diet has been limited by acid reflux/hiatal hernia. Eating smaller portions and has been careful about her sweets. Stays active with house activities.  ? ? Goals Addressed   ?None ?  ?Depression Screen ? ?  10/28/2021  ?  9:28 AM 07/29/2021  ?  3:09 PM 05/28/2021  ?  2:59 PM 04/27/2021  ?  1:22 PM 01/22/2021  ? 11:10 AM 10/22/2020  ? 10:38 AM 06/04/2020  ? 11:50 AM  ?PHQ 2/9 Scores  ?PHQ - 2 Score 0 0 0 0 0 0 0  ?PHQ- 9 Score  '1 2 2 5 5 '$ 0  ?  ?Fall Risk ? ?  10/28/2021  ?  9:27 AM 07/29/2021  ?  3:09 PM 05/28/2021  ?  2:59 PM 04/27/2021  ?  1:22 PM 01/22/2021  ? 11:09 AM  ?Fall Risk   ?Falls in the past year? 0 0 0 0 0  ?Number falls in past yr: 0 0 0 0 0  ?Injury with Fall? 0 0 0 0 0  ?Risk for fall due to : No Fall Risks No Fall Risks No Fall Risks No Fall Risks No Fall Risks  ?Follow up Falls evaluation completed Falls evaluation completed Falls evaluation completed Falls evaluation completed Falls evaluation completed  ? ? ?FALL RISK PREVENTION PERTAINING TO THE HOME: ? ?Any stairs in or around the home? No  ?If so, are there any without handrails? No  ?Home free of loose throw rugs in walkways, pet  beds, electrical cords, etc? Yes  ?Adequate lighting in your home to reduce risk of falls? Yes  ? ?ASSISTIVE DEVICES UTILIZED TO PREVENT FALLS: ? ?Life alert? No  ?Use of a cane, walker or w/c? No  ?Grab bars in the  bathroom? Yes  ?Shower chair or bench in shower? Yes  ?Elevated toilet seat or a handicapped toilet? No  ? ?TIMED UP AND GO: ? ?Was the test performed? Yes .  ?Length of time to ambulate 10 feet: 8 sec.  ? ?Gait steady and fast without use of assistive device ? ?Cognitive Function: ? ?  05/01/2019  ? 11:33 AM  ?MMSE - Mini Mental State Exam  ?Orientation to time 5  ?Orientation to Place 5  ?Registration 3  ?Attention/ Calculation 5  ?Recall 3  ?Language- name 2 objects 2  ?Language- repeat 1  ?Language- follow 3 step command-comments Virtual appointment  ?Language-read & follow direction-comments virtual appointment  ?Write a sentence-comments virtual appointment  ?Copy design-comments virtual appointment  ? ?  ? ?  10/28/2021  ?  9:28 AM 10/22/2020  ? 10:39 AM 05/01/2019  ? 11:28 AM  ?6CIT Screen  ?What Year? 0 points 0 points 0 points  ?What month? 0 points 0 points 0 points  ?What time? 0 points 0 points 0 points  ?Count back from 20 0 points 2 points 0 points  ?Months in reverse 0 points 0 points 0 points  ?Repeat phrase 0 points 0 points 4 points  ?Total Score 0 points 2 points 4 points  ? ? ?Immunizations ?There is no immunization history for the selected administration types on file for this patient. ? ?Tdap: Due ? ?Flu Vaccine status: Declined, Education has been provided regarding the importance of this vaccine but patient still declined. Advised may receive this vaccine at local pharmacy or Health Dept. Aware to provide a copy of the vaccination record if obtained from local pharmacy or Health Dept. Verbalized acceptance and understanding. ? ?Pneumococcal vaccine status: Declined,  Education has been provided regarding the importance of this vaccine but patient still declined. Advised may receive  this vaccine at local pharmacy or Health Dept. Aware to provide a copy of the vaccination record if obtained from local pharmacy or Health Dept. Verbalized acceptance and understanding.  ? ?Covid-19 vaccine s

## 2021-10-28 NOTE — Patient Instructions (Signed)
Preventive Care 7 Years and Older, Female ?Preventive care refers to lifestyle choices and visits with your health care provider that can promote health and wellness. Preventive care visits are also called wellness exams. ?What can I expect for my preventive care visit? ?Counseling ?Your health care provider may ask you questions about your: ?Medical history, including: ?Past medical problems. ?Family medical history. ?Pregnancy and menstrual history. ?History of falls. ?Current health, including: ?Memory and ability to understand (cognition). ?Emotional well-being. ?Home life and relationship well-being. ?Sexual activity and sexual health. ?Lifestyle, including: ?Alcohol, nicotine or tobacco, and drug use. ?Access to firearms. ?Diet, exercise, and sleep habits. ?Work and work Statistician. ?Sunscreen use. ?Safety issues such as seatbelt and bike helmet use. ?Physical exam ?Your health care provider will check your: ?Height and weight. These may be used to calculate your BMI (body mass index). BMI is a measurement that tells if you are at a healthy weight. ?Waist circumference. This measures the distance around your waistline. This measurement also tells if you are at a healthy weight and may help predict your risk of certain diseases, such as type 2 diabetes and high blood pressure. ?Heart rate and blood pressure. ?Body temperature. ?Skin for abnormal spots. ?What immunizations do I need? ? ?Vaccines are usually given at various ages, according to a schedule. Your health care provider will recommend vaccines for you based on your age, medical history, and lifestyle or other factors, such as travel or where you work. ?What tests do I need? ?Screening ?Your health care provider may recommend screening tests for certain conditions. This may include: ?Lipid and cholesterol levels. ?Hepatitis C test. ?Hepatitis B test. ?HIV (human immunodeficiency virus) test. ?STI (sexually transmitted infection) testing, if you are at  risk. ?Lung cancer screening. ?Colorectal cancer screening. ?Diabetes screening. This is done by checking your blood sugar (glucose) after you have not eaten for a while (fasting). ?Mammogram. Talk with your health care provider about how often you should have regular mammograms. ?BRCA-related cancer screening. This may be done if you have a family history of breast, ovarian, tubal, or peritoneal cancers. ?Bone density scan. This is done to screen for osteoporosis. ?Talk with your health care provider about your test results, treatment options, and if necessary, the need for more tests. ?Follow these instructions at home: ?Eating and drinking ? ?Eat a diet that includes fresh fruits and vegetables, whole grains, lean protein, and low-fat dairy products. Limit your intake of foods with high amounts of sugar, saturated fats, and salt. ?Take vitamin and mineral supplements as recommended by your health care provider. ?Do not drink alcohol if your health care provider tells you not to drink. ?If you drink alcohol: ?Limit how much you have to 0-1 drink a day. ?Know how much alcohol is in your drink. In the [place] one drink equals one 12 oz bottle of beer (355 mL), one 5 oz glass of wine (148 mL), or one 1? oz glass of hard liquor (44 mL). ?Lifestyle ?Brush your teeth every morning and night with fluoride toothpaste. Floss one time each day. ?Exercise for at least 30 minutes 5 or more days each week. ?Do not use any products that contain nicotine or tobacco. These products include cigarettes, chewing tobacco, and vaping devices, such as e-cigarettes. If you need help quitting, ask your health care provider. ?Do not use drugs. ?If you are sexually active, practice safe sex. Use a condom or other form of protection in order to prevent STIs. ?Take aspirin only as told by  your health care provider. Make sure that you understand how much to take and what form to take. Work with your health care provider to find out whether it  is safe and beneficial for you to take aspirin daily. ?Ask your health care provider if you need to take a cholesterol-lowering medicine (statin). ?Find healthy ways to manage stress, such as: ?Meditation, yoga, or listening to music. ?Journaling. ?Talking to a trusted person. ?Spending time with friends and family. ?Minimize exposure to UV radiation to reduce your risk of skin cancer. ?Safety ?Always wear your seat belt while driving or riding in a vehicle. ?Do not drive: ?If you have been drinking alcohol. Do not ride with someone who has been drinking. ?When you are tired or distracted. ?While texting. ?If you have been using any mind-altering substances or drugs. ?Wear a helmet and other protective equipment during sports activities. ?If you have firearms in your house, make sure you follow all gun safety procedures. ?What's next? ?Visit your health care provider once a year for an annual wellness visit. ?Ask your health care provider how often you should have your eyes and teeth checked. ?Stay up to date on all vaccines. ?This information is not intended to replace advice given to you by your health care provider. Make sure you discuss any questions you have with your health care provider. ?Document Revised: 11/26/2020 Document Reviewed: 11/26/2020 ?Elsevier Patient Education ? [place]. ? ?

## 2021-10-29 LAB — CBC WITH DIFFERENTIAL/PLATELET
Basophils Absolute: 0.1 10*3/uL (ref 0.0–0.2)
Basos: 1 %
EOS (ABSOLUTE): 0.1 10*3/uL (ref 0.0–0.4)
Eos: 1 %
Hematocrit: 47.9 % — ABNORMAL HIGH (ref 34.0–46.6)
Hemoglobin: 15.8 g/dL (ref 11.1–15.9)
Immature Grans (Abs): 0.1 10*3/uL (ref 0.0–0.1)
Immature Granulocytes: 1 %
Lymphocytes Absolute: 2.3 10*3/uL (ref 0.7–3.1)
Lymphs: 23 %
MCH: 29.7 pg (ref 26.6–33.0)
MCHC: 33 g/dL (ref 31.5–35.7)
MCV: 90 fL (ref 79–97)
Monocytes Absolute: 0.9 10*3/uL (ref 0.1–0.9)
Monocytes: 9 %
Neutrophils Absolute: 6.7 10*3/uL (ref 1.4–7.0)
Neutrophils: 65 %
Platelets: 303 10*3/uL (ref 150–450)
RBC: 5.32 x10E6/uL — ABNORMAL HIGH (ref 3.77–5.28)
RDW: 12.9 % (ref 11.7–15.4)
WBC: 10.1 10*3/uL (ref 3.4–10.8)

## 2021-10-29 LAB — HEMOGLOBIN A1C
Est. average glucose Bld gHb Est-mCnc: 194 mg/dL
Hgb A1c MFr Bld: 8.4 % — ABNORMAL HIGH (ref 4.8–5.6)

## 2021-10-29 LAB — COMPREHENSIVE METABOLIC PANEL
ALT: 29 IU/L (ref 0–32)
AST: 20 IU/L (ref 0–40)
Albumin/Globulin Ratio: 2 (ref 1.2–2.2)
Albumin: 4.7 g/dL (ref 3.7–4.7)
Alkaline Phosphatase: 95 IU/L (ref 44–121)
BUN/Creatinine Ratio: 25 (ref 12–28)
BUN: 22 mg/dL (ref 8–27)
Bilirubin Total: 0.6 mg/dL (ref 0.0–1.2)
CO2: 21 mmol/L (ref 20–29)
Calcium: 10.5 mg/dL — ABNORMAL HIGH (ref 8.7–10.3)
Chloride: 99 mmol/L (ref 96–106)
Creatinine, Ser: 0.88 mg/dL (ref 0.57–1.00)
Globulin, Total: 2.4 g/dL (ref 1.5–4.5)
Glucose: 227 mg/dL — ABNORMAL HIGH (ref 70–99)
Potassium: 5.3 mmol/L — ABNORMAL HIGH (ref 3.5–5.2)
Sodium: 137 mmol/L (ref 134–144)
Total Protein: 7.1 g/dL (ref 6.0–8.5)
eGFR: 68 mL/min/{1.73_m2} (ref >59)

## 2021-10-29 LAB — T4, FREE: Free T4: 1.46 ng/dL (ref 0.82–1.77)

## 2021-10-29 LAB — TSH: TSH: 1.74 u[IU]/mL (ref 0.450–4.500)

## 2021-10-29 MED ORDER — SEMAGLUTIDE (1 MG/DOSE) 4 MG/3ML ~~LOC~~ SOPN: 1.0000 mg | PEN_INJECTOR | SUBCUTANEOUS | 0 refills | Status: DC

## 2021-10-29 NOTE — Addendum Note (Signed)
Addended by: Mickel Crow on: 10/29/2021 12:03 PM   Modules accepted: Orders

## 2021-10-30 NOTE — Patient Instructions (Addendum)
Cold Sore  A cold sore, also called a fever blister, is a small, fluid-filled sore that forms inside the mouth or on the lips, gums, nose, chin, or cheeks. Cold sores can spread to other parts of the body, such as the eyes, fingers, or genitals. Cold sores can spread from person to person (are contagious) until the sores crust over completely. Most cold sores go away within 2 weeks. What are the causes? Cold sores are caused by an infection from a common type of herpes simplex virus (HSV-1). HSV-1 is closely related to the HSV-2virus, which is the virus that causes genital herpes, but these viruses are not the same. Once a person is infected with HSV-1, the virus remains permanently in the body. HSV-1 is spread from person to person through close contact, such as through kissing, touching the affected area, or sharing personal items such as lip balm, razors, a drinking glass, or eating utensils. What increases the risk? You are more likely to develop this condition if you: Are tired, stressed, or sick. Are menstruating. Are pregnant. Take certain medicines. Are exposed to cold weather or too much sun. What are the signs or symptoms? Symptoms of a cold sore outbreak go through different stages. These are the stages of a cold sore: Tingling, itching, or burning is felt 1-2 days before the outbreak. Fluid-filled blisters appear on the lips, inside the mouth, on the nose, or on the cheeks. The blisters start to ooze clear fluid. The blisters dry up, and a yellow crust appears in their place. The crust falls off. In some cases, other symptoms can develop during a cold sore outbreak. These can include: Fever. Sore throat. Headache. Muscle aches. Swollen neck glands. How is this diagnosed? This condition is diagnosed based on your medical history and a physical exam. Your health care provider may do a blood test or may swab some fluid from your sore and then examine the swab in the lab. How is  this treated? There is no cure for cold sores or HSV-1. There is also no vaccine for HSV-1. Most cold sores go away on their own without treatment within 2 weeks. Medicines cannot make the infection go away, but your health care provider may prescribe medicines to: Help relieve some of the pain associated with the sores. Work to stop the virus from multiplying. Shorten healing time. Medicines may be in the form of creams, gels, pills, or a shot. Follow these instructions at home: Medicines Take or apply over-the-counter and prescription medicines only as told by your health care provider. Use a cotton-tip swab to apply creams or gels to your sores. Ask your health care provider if you can take lysine supplements. Research has found that lysine may help heal the cold sore faster and prevent outbreaks. Sore care  Do not touch the sores or pick the scabs. Wash your hands often with soap and water for at least 20 seconds. Do not touch your eyes without washing your hands first. Keep the sores clean and dry. If directed, put ice on the sores. To do this: Put ice in a plastic bag. Place a towel between your skin and the bag. Leave the ice on for 20 minutes, 2-3 times a day. Remove the ice if your skin turns bright red. This is very important. If you cannot feel pain, heat, or cold, you have a greater risk of damage to the area. Eating and drinking Eat a soft, bland diet. Avoid eating hot, cold, or salty foods.   Use a straw if it hurts to drink out of a glass. Eat foods that are rich in lysine, such as meat, fish, and dairy products. Avoid sugary foods, chocolates, nuts, and grains. These foods are rich in a nutrient called arginine, which can cause the virus to multiply. Lifestyle Do not kiss, have oral sex, or share personal items until your sores heal. Stress, poor sleep, and being out in the sun can trigger outbreaks. Make sure you: Do activities that help you relax, such as deep breathing  exercises or meditation. Get enough sleep. Apply sunscreen on your lips before you go out in the sun. Contact a health care provider if: You have symptoms for more than 2 weeks. You have pus coming from the sores. You have redness that is spreading. You have pain or irritation in your eye. You get sores on your genitals. Your sores do not heal within 2 weeks. You have frequent cold sore outbreaks. Get help right away if: You have a fever and your symptoms suddenly get worse. You have a headache and confusion. You have tiredness (fatigue) or loss of appetite. You have a stiff neck or sensitivity to light. Summary A cold sore, also called a fever blister, is a small, fluid-filled sore that forms inside the mouth or on the lips, gums, nose, chin, or cheeks. Most cold sores go away on their own without treatment within 2 weeks. Your health care provider may prescribe medicines to help relieve some of the pain, work to stop the virus from multiplying, and shorten healing time. Wash your hands often with soap and water for at least 20 seconds. Do not touch your eyes without washing your hands first. Do not kiss, have oral sex, or share personal items until your sores heal. Contact a health care provider if your sores do not heal within 2 weeks. This information is not intended to replace advice given to you by your health care provider. Make sure you discuss any questions you have with your health care provider. Document Revised: 03/11/2021 Document Reviewed: 03/11/2021 Elsevier Patient Education  Green is an infection. It gives you a painful skin rash and blisters that have fluid in them. Shingles is caused by the same germ (virus) that causes chickenpox. Shingles only happens in people who: Have had chickenpox. Have been given a shot (vaccine) to protect against chickenpox. Shingles is rare in this group. What are the causes? This condition is caused by  varicella-zoster virus. This is the same germ that causes chickenpox. After a person is exposed to the germ, the germ stays in the body but is not active (dormant). Shingles develops if the germ becomes active again (is reactivated). This can happen many years after the first exposure to the germ. It is not known what causes this germ to become active again. What increases the risk? People who have had chickenpox or received the chickenpox shot are at risk for shingles. This infection is more common in people who: Are older than 77 years of age. Have a weakened disease-fighting system (immune system), such as people with: HIV (human immunodeficiency virus). AIDS (acquired immunodeficiency syndrome). Cancer. Are taking medicines that weaken the immune system, such as organ transplant medicines. Have a lot of stress. What are the signs or symptoms? The first symptoms of shingles may be itching, tingling, or pain in an area on your skin. A rash will show on your skin a few days or weeks later. This is  what usually happens: The rash is likely to be on one side of your body. The rash usually has a shape like a belt or a band. Over time, the rash turns into fluid-filled blisters. The blisters will break open and change into scabs. The scabs usually dry up in about 2-3 weeks. You may also have: A fever. Chills. A headache. A feeling like you may vomit (nausea). How is this treated? The rash may last for several weeks. There is not a specific cure for this condition. Your doctor may prescribe medicines. Medicines may: Help with pain. Help you get better sooner. Help to prevent long-term problems. Help with itching (antihistamines). If the area involved is on your face, you may need to see a specialist. This may be an eye doctor or an ear, nose, and throat (ENT) doctor. Follow these instructions at home: Medicines Take over-the-counter and prescription medicines only as told by your  doctor. Put on an anti-itch cream or numbing cream where you have a rash, blisters, or scabs. Do this as told by your doctor. Helping with itching and discomfort  Put cold, wet cloths (cold compresses) on the area of the rash or blisters as told by your doctor. Cool baths can help you feel better. Try adding baking soda or dry oatmeal to the water to lessen itching. Do not bathe in hot water. Use calamine lotion as told by your doctor. Blister and rash care Keep your rash covered with a loose bandage (dressing). Wear loose clothing that does not rub on your rash. Wash your hands with soap and water for at least 20 seconds before and after you change your bandage. If you cannot use soap and water, use hand sanitizer. Change your bandage as told by your doctor. Keep your rash and blisters clean. To do this, wash the area with mild soap and cool water as told by your doctor. Check your rash every day for signs of infection. Check for: More redness, swelling, or pain. Fluid or blood. Warmth. Pus or a bad smell. Do not scratch your rash. Do not pick at your blisters. To help you to not scratch: Keep your fingernails clean and cut short. Wear gloves or mittens when you sleep, if scratching is a problem. General instructions Rest as told by your doctor. Wash your hands often with soap and water for at least 20 seconds. If you cannot use soap and water, use hand sanitizer. Doing this lowers your chance of getting a skin infection. Your infection can cause chickenpox in people who have never had chickenpox or never got a chickenpox vaccine shot. If you have blisters that did not change into scabs yet, try not to touch other people or be around other people, especially: Babies. Pregnant women. Children who have areas of red, itchy, or rough skin (eczema). Older people who have organ transplants. People who have a long-term (chronic) illness, like cancer or AIDS. Keep all follow-up visits. How is  this prevented? A vaccine shot is the best way to prevent shingles and protect against shingles problems. If you have not had a vaccine shot, talk with your doctor about getting it. Where to find more information Centers for Disease Control and Prevention: http://www.wolf.info/ Contact a doctor if: Your pain does not get better with medicine. Your pain does not get better after the rash heals. You have any of these signs of infection around the rash: More redness, swelling, or pain. Fluid or blood. Warmth. Pus or a bad smell. You have  a fever. Get help right away if: The rash is on your face or nose. You have pain in your face or pain by your eye. You lose feeling on one side of your face. You have trouble seeing. You have ear pain, or you have ringing in your ear. You have a loss of taste. Your condition gets worse. Summary Shingles gives you a painful skin rash and blisters that have fluid in them. Shingles is caused by the same germ (virus) that causes chickenpox. Keep your rash covered with a loose bandage. Wear loose clothing that does not rub on your rash. If you have blisters that did not change into scabs yet, try not to touch other people or be around people. This information is not intended to replace advice given to you by your health care provider. Make sure you discuss any questions you have with your health care provider. Document Revised: 05/26/2020 Document Reviewed: 05/26/2020 Elsevier Patient Education  [place].

## 2021-11-03 ENCOUNTER — Encounter: Payer: Self-pay | Admitting: Physician Assistant

## 2021-11-03 ENCOUNTER — Ambulatory Visit (INDEPENDENT_AMBULATORY_CARE_PROVIDER_SITE_OTHER): Payer: Medicare PPO | Admitting: Physician Assistant

## 2021-11-03 VITALS — BP 132/73 | HR 65 | Temp 97.7°F | Ht 66.0 in | Wt 160.0 lb

## 2021-11-03 DIAGNOSIS — B001 Herpesviral vesicular dermatitis: Secondary | ICD-10-CM

## 2021-11-03 MED ORDER — VALACYCLOVIR HCL 1 G PO TABS: 1000.0000 mg | ORAL_TABLET | Freq: Two times a day (BID) | ORAL | 0 refills | Status: AC

## 2021-11-03 NOTE — Progress Notes (Signed)
Established patient visit   Patient: Amanda Douglas   DOB: May 16, 1946   77 y.o. Female  MRN: [id number] Visit Date: 11/03/2021  Chief Complaint  Patient presents with   Rash   Subjective    HPI  Patient presents for cyst removal. However, currently has outbreak of cold sores. Lesions only hurting with eating. Patient reports had a low grade fever for 2 days last week and general malaise. First mouth ulcer appeared last Friday or Saturday the more developed. Had a few on her chin which have cleared but area is itchy. Denies burning or tingling sensation.   Medications: Outpatient Medications Prior to Visit  Medication Sig   Cholecalciferol (VITAMIN D3) 5000 units CAPS Take 1 capsule by mouth daily.   citalopram (CELEXA) 20 MG tablet TAKE 1 TABLET BY MOUTH DAILY.   fexofenadine (ALLEGRA) 180 MG tablet Take 1 tablet (180 mg total) by mouth daily.   JARDIANCE 25 MG TABS tablet TAKE 1 TABLET (25 MG TOTAL) BY MOUTH DAILY BEFORE BREAKFAST.   levothyroxine (SYNTHROID) 75 MCG tablet TAKE 1 TABLET (75 MCG TOTAL) BY MOUTH DAILY BEFORE BREAKFAST.   Lysine 1000 MG TABS Take 1 tablet by mouth daily.   montelukast (SINGULAIR) 10 MG tablet TAKE 1 TABLET BY MOUTH AT BEDTIME.   Multiple Vitamin (MULTIVITAMIN) tablet Take 1 tablet by mouth daily.   pantoprazole (PROTONIX) 20 MG tablet Take 1 tablet (20 mg total) by mouth daily.   rosuvastatin (CRESTOR) 20 MG tablet TAKE 1 TABLET (20 MG TOTAL) BY MOUTH AT BEDTIME.   Semaglutide, 1 MG/DOSE, 4 MG/3ML SOPN Inject 1 mg as directed once a week.   sucralfate (CARAFATE) 1 g tablet Take 1 tablet (1 g total) by mouth 3 (three) times daily as needed.   triamcinolone (NASACORT) 55 MCG/ACT AERO nasal inhaler Place 2 sprays into the nose daily.   No facility-administered medications prior to visit.    Review of Systems Review of Systems:  A fourteen system review of systems was performed and found to be positive as per HPI.  Last CBC Lab Results  Component  Value Date   WBC 10.1 10/28/2021   HGB 15.8 10/28/2021   HCT 47.9 (H) 10/28/2021   MCV 90 10/28/2021   MCH 29.7 10/28/2021   RDW 12.9 10/28/2021   PLT 303 28/76/8115   Last metabolic panel Lab Results  Component Value Date   GLUCOSE 227 (H) 10/28/2021   NA 137 10/28/2021   K 5.3 (H) 10/28/2021   CL 99 10/28/2021   CO2 21 10/28/2021   BUN 22 10/28/2021   CREATININE 0.88 10/28/2021   EGFR 68 10/28/2021   CALCIUM 10.5 (H) 10/28/2021   PROT 7.1 10/28/2021   ALBUMIN 4.7 10/28/2021   LABGLOB 2.4 10/28/2021   AGRATIO 2.0 10/28/2021   BILITOT 0.6 10/28/2021   ALKPHOS 95 10/28/2021   AST 20 10/28/2021   ALT 29 10/28/2021   Last lipids Lab Results  Component Value Date   CHOL 169 04/24/2021   HDL 54 04/24/2021   LDLCALC 86 04/24/2021   TRIG 173 (H) 04/24/2021   CHOLHDL 3.1 04/24/2021   Last hemoglobin A1c Lab Results  Component Value Date   HGBA1C 8.4 (H) 10/28/2021   Last thyroid functions Lab Results  Component Value Date   TSH 1.740 10/28/2021   T3TOTAL 72 12/05/2020   Last vitamin D Lab Results  Component Value Date   VD25OH 56.9 10/01/2020   Last vitamin B12 and Folate Lab Results  Component Value Date  VITAMINB12 435 11/16/2018     Objective    BP 132/73   Pulse 65   Temp 97.7 F (36.5 C)   Ht '5\' 6"'  (1.676 m)   Wt 160 lb (72.6 kg)   SpO2 99%   BMI 25.82 kg/m  BP Readings from Last 3 Encounters:  11/03/21 132/73  10/28/21 113/71  07/29/21 126/78   Wt Readings from Last 3 Encounters:  11/03/21 160 lb (72.6 kg)  10/28/21 160 lb (72.6 kg)  07/29/21 168 lb (76.2 kg)    Physical Exam  General:  Well Developed, well nourished, appropriate for stated age.  Neuro:  Alert and oriented,  extra-ocular muscles intact  HEENT:  Normocephalic, atraumatic, several ulcers of lower inner lip noted, neck supple  Skin:  red and dry area of chin, no vesicles identified  Respiratory: Speaking in full sentences, unlabored. Vascular:  Ext warm, no cyanosis  apprec.; cap RF less 2 sec. Psych:  No HI/SI, judgement and insight good, Euthymic mood. Full Affect.   No results found for any visits on 11/03/21.  Assessment & Plan      Problem List Items Addressed This Visit   None Visit Diagnoses     Cold sore    -  Primary   Relevant Medications   valACYclovir (VALTREX) 1000 MG tablet      Discussed with patient recommend starting oral antiviral medication and patient is agreeable. No signs consistent with herpes zoster present at this time. Recommend to reschedule cyst removal until lesions have resolved and feeling better. Pt verbalized understanding.   Return if symptoms worsen or fail to improve, for pt will reschedule cyst removal when feeling better.        Lorrene Reid, PA-C  [facility] [phone] (phone) [phone] (fax)  [place]

## 2021-11-10 DIAGNOSIS — Z1211 Encounter for screening for malignant neoplasm of colon: Secondary | ICD-10-CM | POA: Diagnosis not present

## 2021-11-18 LAB — COLOGUARD: COLOGUARD: NEGATIVE

## 2021-11-26 ENCOUNTER — Other Ambulatory Visit: Payer: Self-pay | Admitting: Physician Assistant

## 2021-11-26 DIAGNOSIS — E118 Type 2 diabetes mellitus with unspecified complications: Secondary | ICD-10-CM

## 2021-12-03 ENCOUNTER — Ambulatory Visit: Payer: Medicare PPO | Admitting: Physician Assistant

## 2021-12-07 ENCOUNTER — Other Ambulatory Visit: Payer: Self-pay | Admitting: Physician Assistant

## 2021-12-07 DIAGNOSIS — E1169 Type 2 diabetes mellitus with other specified complication: Secondary | ICD-10-CM

## 2021-12-07 DIAGNOSIS — E039 Hypothyroidism, unspecified: Secondary | ICD-10-CM

## 2021-12-22 ENCOUNTER — Other Ambulatory Visit: Payer: Self-pay | Admitting: Physician Assistant

## 2021-12-22 DIAGNOSIS — E118 Type 2 diabetes mellitus with unspecified complications: Secondary | ICD-10-CM

## 2021-12-26 ENCOUNTER — Other Ambulatory Visit: Payer: Self-pay | Admitting: Physician Assistant

## 2021-12-26 DIAGNOSIS — E118 Type 2 diabetes mellitus with unspecified complications: Secondary | ICD-10-CM

## 2021-12-28 ENCOUNTER — Encounter: Payer: Self-pay | Admitting: Physician Assistant

## 2021-12-28 ENCOUNTER — Ambulatory Visit (INDEPENDENT_AMBULATORY_CARE_PROVIDER_SITE_OTHER): Payer: Medicare PPO | Admitting: Physician Assistant

## 2021-12-28 VITALS — BP 128/79 | HR 98 | Temp 97.7°F | Ht 66.0 in | Wt 160.0 lb

## 2021-12-28 DIAGNOSIS — L720 Epidermal cyst: Secondary | ICD-10-CM | POA: Diagnosis not present

## 2021-12-28 NOTE — Progress Notes (Signed)
Established Patient Office Visit  Subjective    Patient ID: Amanda Douglas, female    DOB: 10-04-1945  Age: 76 y.o. MRN: [id number]  CC:  Chief Complaint  Patient presents with   Procedure    HPI Amanda Douglas presents for cyst removal. Denies fever, redness, tenderness or chills. No prior complications with local anesthesia.    Outpatient Encounter Medications as of 12/28/2021  Medication Sig   Cholecalciferol (VITAMIN D3) 5000 units CAPS Take 1 capsule by mouth daily.   citalopram (CELEXA) 20 MG tablet TAKE 1 TABLET BY MOUTH DAILY.   fexofenadine (ALLEGRA) 180 MG tablet Take 1 tablet (180 mg total) by mouth daily.   JARDIANCE 25 MG TABS tablet TAKE 1 TABLET (25 MG TOTAL) BY MOUTH DAILY BEFORE BREAKFAST.   levothyroxine (SYNTHROID) 75 MCG tablet TAKE 1 TABLET (75 MCG TOTAL) BY MOUTH DAILY BEFORE BREAKFAST.   Lysine 1000 MG TABS Take 1 tablet by mouth daily.   montelukast (SINGULAIR) 10 MG tablet TAKE 1 TABLET BY MOUTH AT BEDTIME.   Multiple Vitamin (MULTIVITAMIN) tablet Take 1 tablet by mouth daily.   OZEMPIC, 1 MG/DOSE, 4 MG/3ML SOPN INJECT 1 MG AS DIRECTED ONCE A WEEK.   pantoprazole (PROTONIX) 20 MG tablet Take 1 tablet (20 mg total) by mouth daily.   rosuvastatin (CRESTOR) 20 MG tablet TAKE 1 TABLET (20 MG TOTAL) BY MOUTH AT BEDTIME.   sucralfate (CARAFATE) 1 g tablet Take 1 tablet (1 g total) by mouth 3 (three) times daily as needed.   triamcinolone (NASACORT) 55 MCG/ACT AERO nasal inhaler Place 2 sprays into the nose daily.   [DISCONTINUED] OZEMPIC, 1 MG/DOSE, 4 MG/3ML SOPN INJECT 1 MG AS DIRECTED ONCE A WEEK.   No facility-administered encounter medications on file as of 12/28/2021.    Past Medical History:  Diagnosis Date   Depression    Diabetes mellitus without complication ([place])    Hyperlipidemia    Thyroid disease     Past Surgical History:  Procedure Laterality Date   ANKLE FRACTURE SURGERY     CESAREAN SECTION      Family History  Problem  Relation Age of Onset   Diabetes Mother    Hypertension Mother    Thyroid disease Mother    Depression Mother    COPD Sister    Arthritis Sister    Healthy Brother    Healthy Daughter    Diabetes Son    Cancer Sister        squamous cell   Hypertension Son     Social History   Socioeconomic History   Marital status: Married    Spouse name: Not on file   Number of children: Not on file   Years of education: Not on file   Highest education level: Not on file  Occupational History   Not on file  Tobacco Use   Smoking status: Former    Packs/day: 1.00    Years: 15.00    Total pack years: 15.00    Types: Cigarettes    Quit date: 06/14/1977    Years since quitting: 44.5   Smokeless tobacco: Never  Vaping Use   Vaping Use: Never used  Substance and Sexual Activity   Alcohol use: No   Drug use: No   Sexual activity: Never  Other Topics Concern   Not on file  Social History Narrative   Not on file   Social Determinants of Health   Financial Resource Strain: Not on file  Food Insecurity:  Not on file  Transportation Needs: Not on file  Physical Activity: Not on file  Stress: Not on file  Social Connections: Not on file  Intimate Partner Violence: Not on file    ROS Review of Systems:  A fourteen system review of systems was performed and found to be positive as per HPI.     Objective    BP 128/79   Pulse 98   Temp 97.7 F (36.5 C)   Ht '5\' 6"'$  (1.676 m)   Wt 160 lb (72.6 kg)   SpO2 99%   BMI 25.82 kg/m   Physical Exam General:  Well Developed, well nourished, appropriate for stated age.  Neuro:  Alert and oriented,  extra-ocular muscles intact  HEENT:  Normocephalic, atraumatic, neck supple  Skin:  1 cm x 1 cm firm mass over right cheek without erythema or drainage Cardiac:  RRR, S1 S2 Respiratory: CTA B/L  Vascular:  Ext warm, no cyanosis apprec.; cap RF less 2 sec. Psych:  No HI/SI, judgement and insight good, Euthymic mood. Full Affect.       Assessment & Plan:   Problem List Items Addressed This Visit   None Visit Diagnoses     Epidermoid cyst of face    -  Primary      Epidermoid cyst of face: Procedure:  cyst removal Risks, benefits, and alternatives explained and consent obtained. Time out conducted. Surface cleaned with alcohol * 3 2 cc 1% lidocaine without epinephine infiltrated around cyst. Adequate anesthesia ensured. Area prepped and draped in a sterile fashion. #11 blade used to make a small incision into cyst . White material expressed with pressure. Curved hemostat used to explore 4 quadrants and cyst broken up. Hemostasis achieved. 2 steri strips placed EBL: Less than 1 mL  Pt tolerated procedure well; stable. Wound care, aftercare and follow-up advised.   All questions were answered.    Return in about 1 week (around 01/04/2022) for cyst removal f/up.   Lorrene Reid, PA-C

## 2022-01-05 ENCOUNTER — Encounter: Payer: Self-pay | Admitting: Physician Assistant

## 2022-01-05 ENCOUNTER — Ambulatory Visit (INDEPENDENT_AMBULATORY_CARE_PROVIDER_SITE_OTHER): Payer: Medicare PPO | Admitting: Physician Assistant

## 2022-01-05 DIAGNOSIS — L720 Epidermal cyst: Secondary | ICD-10-CM

## 2022-01-05 NOTE — Patient Instructions (Signed)
Epidermoid Cyst Removal, Care After This sheet gives you information about how to care for yourself after your procedure. Your health care provider may also give you more specific instructions. If you have problems or questions, contact your health care provider. What can I expect after the procedure? After the procedure, it is common to have: Soreness in the area where your cyst was removed. Tightness or itchiness from the stitches (sutures) in your skin. Follow these instructions at home: Medicines Take over-the-counter and prescription medicines only as told by your health care provider. If you were prescribed an antibiotic medicine or ointment, take or apply it as told by your health care provider. Do not stop using the antibiotic even if you start to feel better. Incision care  Follow instructions from your health care provider about how to take care of your incision. Make sure you: Wash your hands with soap and water for at least 20 seconds before you change your bandage (dressing). If soap and water are not available, use hand sanitizer. Change your dressing as told by your health care provider. Leave sutures, skin glue, or adhesive strips in place. These skin closures may need to stay in place for 1-2 weeks or longer. If adhesive strip edges start to loosen and curl up, you may trim the loose edges. Do not remove adhesive strips completely unless your health care provider tells you to do that. Keep the dressing dry until your health care provider says that it can be removed. After your dressing is off, check your incision area every day for signs of infection. Check for: Redness, swelling, or pain. Fluid or blood. Warmth. Pus or a bad smell. General instructions Do not take baths, swim, or use a hot tub until your health care provider approves. Ask your health care provider if you may take showers. You may only be allowed to take sponge baths. Your health care provider may ask you to  avoid contact sports or activities that take a lot of effort. Do not do anything that stretches or puts pressure on your incision. You can return to your normal diet. Keep all follow-up visits. This is important. Contact a health care provider if: You have a fever. You have redness, swelling, or pain in the incision area. You have fluid or blood coming from your incision. You have pus or a bad smell coming from your incision. Your incision feels warm to the touch. Your cyst grows back. Get help right away if: If the incision site suddenly increases in size and you have pain at the incision site. You may be checked for a collection of blood under the skin from the procedure (hematoma). Summary After the procedure, it is common to have soreness in the area where your cyst was removed. Take or apply over-the-counter and prescription medicines only as told by your health care provider. Follow instructions from your health care provider about how to take care of your incision. This information is not intended to replace advice given to you by your health care provider. Make sure you discuss any questions you have with your health care provider. Document Revised: 09/05/2019 Document Reviewed: 09/05/2019 Elsevier Patient Education  2023 [employer]vier Inc.  

## 2022-01-05 NOTE — Progress Notes (Signed)
  Established patient visit   Patient: Amanda Douglas   DOB: 01-Feb-1946   76 y.o. Female  MRN: [id number] Visit Date: 01/05/2022  Chief Complaint  Patient presents with   Follow-up   Subjective    HPI  Patient presents for cyst removal follow-up.     Medications: Outpatient Medications Prior to Visit  Medication Sig   Cholecalciferol (VITAMIN D3) 5000 units CAPS Take 1 capsule by mouth daily.   citalopram (CELEXA) 20 MG tablet TAKE 1 TABLET BY MOUTH DAILY.   fexofenadine (ALLEGRA) 180 MG tablet Take 1 tablet (180 mg total) by mouth daily.   JARDIANCE 25 MG TABS tablet TAKE 1 TABLET (25 MG TOTAL) BY MOUTH DAILY BEFORE BREAKFAST.   levothyroxine (SYNTHROID) 75 MCG tablet TAKE 1 TABLET (75 MCG TOTAL) BY MOUTH DAILY BEFORE BREAKFAST.   Lysine 1000 MG TABS Take 1 tablet by mouth daily.   montelukast (SINGULAIR) 10 MG tablet TAKE 1 TABLET BY MOUTH AT BEDTIME.   Multiple Vitamin (MULTIVITAMIN) tablet Take 1 tablet by mouth daily.   OZEMPIC, 1 MG/DOSE, 4 MG/3ML SOPN INJECT 1 MG AS DIRECTED ONCE A WEEK.   pantoprazole (PROTONIX) 20 MG tablet Take 1 tablet (20 mg total) by mouth daily.   rosuvastatin (CRESTOR) 20 MG tablet TAKE 1 TABLET (20 MG TOTAL) BY MOUTH AT BEDTIME.   sucralfate (CARAFATE) 1 g tablet Take 1 tablet (1 g total) by mouth 3 (three) times daily as needed.   triamcinolone (NASACORT) 55 MCG/ACT AERO nasal inhaler Place 2 sprays into the nose daily.   No facility-administered medications prior to visit.    Review of Systems A fourteen system review of systems was performed and found to be positive as per HPI.     Objective    There were no vitals taken for this visit. BP Readings from Last 3 Encounters:  12/28/21 128/79  11/03/21 132/73  10/28/21 113/71   Wt Readings from Last 3 Encounters:  12/28/21 160 lb (72.6 kg)  11/03/21 160 lb (72.6 kg)  10/28/21 160 lb (72.6 kg)    Physical Exam  General:  Pleasant and cooperative, appropriate for stated age.   Neuro:  Alert and oriented,  extra-ocular muscles intact  HEENT:  Normocephalic, atraumatic, neck supple  Skin:  good healing incision (right cheek) with scab formation, no redness or drainage Cardiac:  RRR, S1 S2 Respiratory: CTA B/L  Vascular:  Ext warm, no cyanosis apprec.; cap RF less 2 sec. Psych:  No HI/SI, judgement and insight good, Euthymic mood. Full Affect.   No results found for any visits on 01/05/22.  Assessment & Plan      Problem List Items Addressed This Visit   None Visit Diagnoses     Epidermoid cyst of face    -  Primary      Epidermoid cyst of face: -S/p removal. Well healing incision. No signs of infection present at this time. Continue to keep area clean and dry.  Return if symptoms worsen or fail to improve.        Lorrene Reid, PA-C  [facility] at [facility] [phone] (phone) [phone] (fax)  [place]

## 2022-01-25 ENCOUNTER — Other Ambulatory Visit: Payer: Self-pay | Admitting: Physician Assistant

## 2022-01-25 DIAGNOSIS — F4323 Adjustment disorder with mixed anxiety and depressed mood: Secondary | ICD-10-CM

## 2022-01-25 DIAGNOSIS — E118 Type 2 diabetes mellitus with unspecified complications: Secondary | ICD-10-CM

## 2022-02-23 ENCOUNTER — Other Ambulatory Visit: Payer: Self-pay | Admitting: Physician Assistant

## 2022-02-23 DIAGNOSIS — E118 Type 2 diabetes mellitus with unspecified complications: Secondary | ICD-10-CM

## 2022-03-01 ENCOUNTER — Ambulatory Visit (INDEPENDENT_AMBULATORY_CARE_PROVIDER_SITE_OTHER): Payer: Medicare PPO | Admitting: Physician Assistant

## 2022-03-01 ENCOUNTER — Encounter: Payer: Self-pay | Admitting: Physician Assistant

## 2022-03-01 VITALS — BP 136/81 | HR 89 | Temp 98.2°F | Ht 66.0 in | Wt 158.0 lb

## 2022-03-01 DIAGNOSIS — E1169 Type 2 diabetes mellitus with other specified complication: Secondary | ICD-10-CM | POA: Diagnosis not present

## 2022-03-01 DIAGNOSIS — I152 Hypertension secondary to endocrine disorders: Secondary | ICD-10-CM | POA: Diagnosis not present

## 2022-03-01 DIAGNOSIS — E1159 Type 2 diabetes mellitus with other circulatory complications: Secondary | ICD-10-CM | POA: Diagnosis not present

## 2022-03-01 DIAGNOSIS — J3489 Other specified disorders of nose and nasal sinuses: Secondary | ICD-10-CM | POA: Diagnosis not present

## 2022-03-01 DIAGNOSIS — E118 Type 2 diabetes mellitus with unspecified complications: Secondary | ICD-10-CM

## 2022-03-01 DIAGNOSIS — E785 Hyperlipidemia, unspecified: Secondary | ICD-10-CM | POA: Diagnosis not present

## 2022-03-01 LAB — POCT GLYCOSYLATED HEMOGLOBIN (HGB A1C): Hemoglobin A1C: 8.6 % — AB (ref 4.0–5.6)

## 2022-03-01 NOTE — Assessment & Plan Note (Addendum)
-  Last lipid panel: HDL 54, LDL 86 (goal <70). -Recommend to continue rosuvastatin 20 mg daily. Recommend repeating lipid panel and hepatic function at follow-up visit.

## 2022-03-01 NOTE — Assessment & Plan Note (Addendum)
-  BP stable (<140/90), diet controlled and patient prefers to continue managing with non-pharmacologic therapy. Recommend repeating CMP at follow-up visit. Will continue to monitor.

## 2022-03-01 NOTE — Assessment & Plan Note (Addendum)
-  A1c has increased from 8.4 to 8.6 (goal<7.5), discussed with patient treatment options including increasing Ozempic to 2 mg or starting additional medication such as Glipizide. Pt prefers to work on diet changes and reducing carbohydrate intake. Advised patient if A1c fails to improve then recommend increasing Ozempic to 2 mg once a week. Another medication that can be considered is changing Ozempic to [facility]. Will continue Jardiance 25 mg daily and Ozempic 1 mg once a week. Recommend to continue ambulatory glucose monitoring. Recommend to follow-up in 3 months to repeat A1c and adjust treatment plan as needed.

## 2022-03-01 NOTE — Patient Instructions (Signed)

## 2022-03-01 NOTE — Progress Notes (Signed)
Established patient visit   Patient: Amanda Douglas   DOB: [birth year]   76 y.o. Female  MRN: [id number] Visit Date: 03/01/2022  Chief Complaint  Patient presents with   Follow-up   Subjective    HPI  Patient presents for chronic follow-up. Patient has c/o sinus pressure x 3 weeks. Has tried Afrin sinus spray. No fever, sore throat, earache, cough, chills or night sweats. Laying down makes the pressure worse.   Diabetes mellitus: Pt denies increased urination or thirst. Pt reports medication compliance. Reports one hypoglycemic event which improved after eating a piece of bread. Checking glucose at home. FBS range from 130s.   HTN: Pt denies chest pain, palpitations, dizziness or lower extremity swelling. Taking medication as directed without side effects.   HLD: Pt taking medication as directed without issues. No myalgias or muscle weakness.   Medications: Outpatient Medications Prior to Visit  Medication Sig   Cholecalciferol (VITAMIN D3) 5000 units CAPS Take 1 capsule by mouth daily.   citalopram (CELEXA) 20 MG tablet TAKE 1 TABLET BY MOUTH DAILY.   fexofenadine (ALLEGRA) 180 MG tablet Take 1 tablet (180 mg total) by mouth daily.   JARDIANCE 25 MG TABS tablet TAKE 1 TABLET (25 MG TOTAL) BY MOUTH DAILY BEFORE BREAKFAST.   levothyroxine (SYNTHROID) 75 MCG tablet TAKE 1 TABLET (75 MCG TOTAL) BY MOUTH DAILY BEFORE BREAKFAST.   Lysine 1000 MG TABS Take 1 tablet by mouth daily.   montelukast (SINGULAIR) 10 MG tablet TAKE 1 TABLET BY MOUTH AT BEDTIME.   Multiple Vitamin (MULTIVITAMIN) tablet Take 1 tablet by mouth daily.   OZEMPIC, 1 MG/DOSE, 4 MG/3ML SOPN INJECT 1 MG AS DIRECTED ONCE A WEEK.   pantoprazole (PROTONIX) 20 MG tablet Take 1 tablet (20 mg total) by mouth daily.   rosuvastatin (CRESTOR) 20 MG tablet TAKE 1 TABLET (20 MG TOTAL) BY MOUTH AT BEDTIME.   sucralfate (CARAFATE) 1 g tablet Take 1 tablet (1 g total) by mouth 3 (three) times daily as needed.   triamcinolone  (NASACORT) 55 MCG/ACT AERO nasal inhaler Place 2 sprays into the nose daily.   No facility-administered medications prior to visit.    Review of Systems Review of Systems:  A fourteen system review of systems was performed and found to be positive as per HPI.  Last CBC Lab Results  Component Value Date   WBC 10.1 10/28/2021   HGB 15.8 10/28/2021   HCT 47.9 (H) 10/28/2021   MCV 90 10/28/2021   MCH 29.7 10/28/2021   RDW 12.9 10/28/2021   PLT 303 35/70/1779   Last metabolic panel Lab Results  Component Value Date   GLUCOSE 227 (H) 10/28/2021   NA 137 10/28/2021   K 5.3 (H) 10/28/2021   CL 99 10/28/2021   CO2 21 10/28/2021   BUN 22 10/28/2021   CREATININE 0.88 10/28/2021   EGFR 68 10/28/2021   CALCIUM 10.5 (H) 10/28/2021   PROT 7.1 10/28/2021   ALBUMIN 4.7 10/28/2021   LABGLOB 2.4 10/28/2021   AGRATIO 2.0 10/28/2021   BILITOT 0.6 10/28/2021   ALKPHOS 95 10/28/2021   AST 20 10/28/2021   ALT 29 10/28/2021   Last lipids Lab Results  Component Value Date   CHOL 169 04/24/2021   HDL 54 04/24/2021   LDLCALC 86 04/24/2021   TRIG 173 (H) 04/24/2021   CHOLHDL 3.1 04/24/2021   Last hemoglobin A1c Lab Results  Component Value Date   HGBA1C 8.6 (A) 03/01/2022   Last thyroid functions Lab Results  Component  Value Date   TSH 1.740 10/28/2021   T3TOTAL 72 12/05/2020   Last vitamin D Lab Results  Component Value Date   VD25OH 56.9 10/01/2020     Objective    BP 136/81   Pulse 89   Temp 98.2 F (36.8 C) (Oral)   Ht $R'5\' 6"'nd$  (1.676 m)   Wt 158 lb (71.7 kg)   BMI 25.50 kg/m  BP Readings from Last 3 Encounters:  03/01/22 136/81  12/28/21 128/79  11/03/21 132/73   Wt Readings from Last 3 Encounters:  03/01/22 158 lb (71.7 kg)  12/28/21 160 lb (72.6 kg)  11/03/21 160 lb (72.6 kg)    Physical Exam  General:  Pleasant and cooperative, appropriate for stated age.  Neuro:  Alert and oriented,  extra-ocular muscles intact  HEENT:  Normocephalic, atraumatic,  maxillary sinus tenderness, no frontal sinus tenderness, PERRL, normal TM's of both ears, pink and moist nasal mucosa with mildly boggy turbinates of left nostril, neck supple, no adenopathy  Skin:  no gross rash, warm, pink. Cardiac:  RRR, S1 S2 Respiratory: CTA B/L  Vascular:  Ext warm, no cyanosis apprec.; cap RF less 2 sec. Psych:  No HI/SI, judgement and insight good, Euthymic mood. Full Affect.   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 Recommend repeating CMP at follow-up visit. Will continue to monitor.        Endocrine   Hyperlipidemia associated with type 2 diabetes mellitus (HCC) (Chronic)    -Last lipid panel: HDL 54, LDL 86 (goal <70). -Recommend to continue rosuvastatin 20 mg daily. Recommend repeating lipid panel and hepatic function at follow-up visit.      Relevant Orders   POCT HgB A1C (Completed)   Type 2 diabetes mellitus with complication (HCC) - Primary (Chronic)    -A1c has increased from 8.4 to 8.6 (goal<7.5), discussed with patient treatment options including increasing Ozempic to 2 mg or starting additional medication such as Glipizide. Pt prefers to work on diet changes and reducing carbohydrate intake. Advised patient if A1c fails to improve then recommend increasing Ozempic to 2 mg once a week. Another medication that can be considered is changing Ozempic to [facility]. Will continue Jardiance 25 mg daily and Ozempic 1 mg once a week. Recommend to continue ambulatory glucose  monitoring. Recommend to follow-up in 3 months to repeat A1c and adjust treatment plan as needed.      Relevant Orders   POCT HgB A1C (Completed)   Other Visit Diagnoses     Sinus pressure          Sinus pressure: -Discussed with patient no s/sx consistent with bacterial sinusitis present at this time. Recommend nasal rinses and/or nasal spray, steamy showers or use of humidifier. Advised to trial decongestant for a few days, discussed cautious use of Sudafed due to potential side effects.  Return in about 3 months (around 05/31/2022) for DM, HTN, HLD.  Lorrene Reid, PA-C  [facility] at [facility] [phone] (phone) [phone] (fax)  [place]

## 2022-03-23 ENCOUNTER — Other Ambulatory Visit: Payer: Self-pay | Admitting: Physician Assistant

## 2022-03-23 DIAGNOSIS — E118 Type 2 diabetes mellitus with unspecified complications: Secondary | ICD-10-CM

## 2022-03-23 NOTE — Telephone Encounter (Signed)
Hey. This came to me today.

## 2022-03-30 ENCOUNTER — Other Ambulatory Visit: Payer: Self-pay | Admitting: Physician Assistant

## 2022-03-30 DIAGNOSIS — E118 Type 2 diabetes mellitus with unspecified complications: Secondary | ICD-10-CM

## 2022-04-26 ENCOUNTER — Other Ambulatory Visit: Payer: Self-pay | Admitting: Physician Assistant

## 2022-04-26 DIAGNOSIS — F4323 Adjustment disorder with mixed anxiety and depressed mood: Secondary | ICD-10-CM

## 2022-05-13 ENCOUNTER — Other Ambulatory Visit: Payer: Self-pay | Admitting: Nurse Practitioner

## 2022-05-13 DIAGNOSIS — E118 Type 2 diabetes mellitus with unspecified complications: Secondary | ICD-10-CM

## 2022-05-31 ENCOUNTER — Ambulatory Visit: Payer: Medicare PPO | Admitting: Nurse Practitioner

## 2022-06-02 ENCOUNTER — Telehealth: Payer: Self-pay | Admitting: *Deleted

## 2022-06-02 NOTE — Telephone Encounter (Signed)
LVM to call office to assist in rescheduling her appointment that was scheduled on 05/31/22 when the power went out. Nathaniel Yaden Zimmerman Rumple, CMA

## 2022-06-08 ENCOUNTER — Other Ambulatory Visit: Payer: Self-pay | Admitting: Nurse Practitioner

## 2022-06-08 DIAGNOSIS — E039 Hypothyroidism, unspecified: Secondary | ICD-10-CM

## 2022-06-10 ENCOUNTER — Other Ambulatory Visit: Payer: Self-pay | Admitting: Nurse Practitioner

## 2022-06-10 DIAGNOSIS — E1169 Type 2 diabetes mellitus with other specified complication: Secondary | ICD-10-CM

## 2022-07-08 ENCOUNTER — Other Ambulatory Visit: Payer: Self-pay | Admitting: Nurse Practitioner

## 2022-07-08 DIAGNOSIS — E118 Type 2 diabetes mellitus with unspecified complications: Secondary | ICD-10-CM

## 2022-07-08 DIAGNOSIS — E039 Hypothyroidism, unspecified: Secondary | ICD-10-CM

## 2022-07-09 NOTE — Telephone Encounter (Signed)
L.O.V: 03/01/22  N.O.V: 07/13/22  L.R.F: 06/08/22 Levothyroxine 30 tab 0 refill   Refill sent

## 2022-07-13 ENCOUNTER — Ambulatory Visit (INDEPENDENT_AMBULATORY_CARE_PROVIDER_SITE_OTHER): Payer: Medicare PPO | Admitting: Nurse Practitioner

## 2022-07-13 ENCOUNTER — Encounter: Payer: Self-pay | Admitting: Nurse Practitioner

## 2022-07-13 VITALS — BP 96/63 | HR 94 | Ht 66.0 in | Wt 153.4 lb

## 2022-07-13 DIAGNOSIS — E039 Hypothyroidism, unspecified: Secondary | ICD-10-CM | POA: Diagnosis not present

## 2022-07-13 DIAGNOSIS — E1159 Type 2 diabetes mellitus with other circulatory complications: Secondary | ICD-10-CM

## 2022-07-13 DIAGNOSIS — E1169 Type 2 diabetes mellitus with other specified complication: Secondary | ICD-10-CM | POA: Diagnosis not present

## 2022-07-13 DIAGNOSIS — E785 Hyperlipidemia, unspecified: Secondary | ICD-10-CM

## 2022-07-13 DIAGNOSIS — E118 Type 2 diabetes mellitus with unspecified complications: Secondary | ICD-10-CM

## 2022-07-13 DIAGNOSIS — I152 Hypertension secondary to endocrine disorders: Secondary | ICD-10-CM

## 2022-07-13 LAB — POCT UA - MICROALBUMIN
Albumin/Creatinine Ratio, Urine, POC: 30
Creatinine, POC: 10 mg/dL
Microalbumin Ur, POC: 10 mg/L

## 2022-07-13 LAB — POCT GLYCOSYLATED HEMOGLOBIN (HGB A1C): HbA1c POC (<> result, manual entry): 8.1 % (ref 4.0–5.6)

## 2022-07-13 MED ORDER — SEMAGLUTIDE (2 MG/DOSE) 8 MG/3ML ~~LOC~~ SOPN: 2.0000 mg | PEN_INJECTOR | SUBCUTANEOUS | 2 refills | Status: DC

## 2022-07-13 NOTE — Progress Notes (Signed)
Established patient visit   Patient: Amanda Douglas   DOB: 13-Oct-1945   77 y.o. Female  MRN: [id number] Visit Date: 07/13/2022   Chief Complaint  Patient presents with   Follow-up   Diabetes   Subjective    HPI  Follow up.  -DM 2  -HgbA1c 8.1 today down from from 8.6 at her last check -urine microalbumin is normal.  --she is taking ozempic 1 mg weekly and jardiance 25 mg  --has tolerated this well  --she has not had any negative side effects associated with taking Ozempic  -blood pressure low/normal.  -she denies chest pain, chest pressure, or shortness of breath. She denies headaches or visual disturbances. She denies abdominal pain, nausea, vomiting, or changes in bowel or bladder habits.      Medications: Outpatient Medications Prior to Visit  Medication Sig Note   Cholecalciferol (VITAMIN D3) 5000 units CAPS Take 1 capsule by mouth daily.    fexofenadine (ALLEGRA) 180 MG tablet Take 1 tablet (180 mg total) by mouth daily.    levothyroxine (SYNTHROID) 75 MCG tablet TAKE 1 TABLET (75 MCG TOTAL) BY MOUTH DAILY BEFORE BREAKFAST.    Lysine 1000 MG TABS Take 1 tablet by mouth daily.    Multiple Vitamin (MULTIVITAMIN) tablet Take 1 tablet by mouth daily.    sucralfate (CARAFATE) 1 g tablet Take 1 tablet (1 g total) by mouth 3 (three) times daily as needed.    triamcinolone (NASACORT) 55 MCG/ACT AERO nasal inhaler Place 2 sprays into the nose daily.    [DISCONTINUED] citalopram (CELEXA) 20 MG tablet TAKE 1 TABLET BY MOUTH DAILY.    [DISCONTINUED] JARDIANCE 25 MG TABS tablet TAKE 1 TABLET (25 MG TOTAL) BY MOUTH DAILY BEFORE BREAKFAST.    [DISCONTINUED] OZEMPIC, 1 MG/DOSE, 4 MG/3ML SOPN INJECT 1 MG AS DIRECTED ONCE A WEEK. 07/13/2022: increase dose   [DISCONTINUED] rosuvastatin (CRESTOR) 20 MG tablet TAKE 1 TABLET (20 MG TOTAL) BY MOUTH AT BEDTIME.    montelukast (SINGULAIR) 10 MG tablet TAKE 1 TABLET BY MOUTH AT BEDTIME. (Patient not taking: Reported on 07/13/2022)    pantoprazole  (PROTONIX) 20 MG tablet Take 1 tablet (20 mg total) by mouth daily. (Patient not taking: Reported on 07/13/2022)    No facility-administered medications prior to visit.    Review of Systems  All other systems reviewed and are negative.      Objective     Today's Vitals   07/13/22 1518  BP: 96/63  Pulse: 94  SpO2: 96%  Weight: 153 lb 6.4 oz (69.6 kg)  Height: '5\' 6"'$  (1.676 m)   Body mass index is 24.76 kg/m.  BP Readings from Last 3 Encounters:  07/13/22 96/63  03/01/22 136/81  12/28/21 128/79    Wt Readings from Last 3 Encounters:  07/13/22 153 lb 6.4 oz (69.6 kg)  03/01/22 158 lb (71.7 kg)  12/28/21 160 lb (72.6 kg)    Physical Exam Vitals and nursing note reviewed.  Constitutional:      Appearance: Normal appearance. She is well-developed.  HENT:     Head: Normocephalic and atraumatic.     Nose: Nose normal.     Mouth/Throat:     Mouth: Mucous membranes are moist.     Pharynx: Oropharynx is clear.  Eyes:     Extraocular Movements: Extraocular movements intact.     Conjunctiva/sclera: Conjunctivae normal.     Pupils: Pupils are equal, round, and reactive to light.  Cardiovascular:     Rate and Rhythm: Normal rate and  regular rhythm.     Pulses: Normal pulses.     Heart sounds: Normal heart sounds.  Pulmonary:     Effort: Pulmonary effort is normal.     Breath sounds: Normal breath sounds.  Abdominal:     Palpations: Abdomen is soft.  Musculoskeletal:        General: Normal range of motion.     Cervical back: Normal range of motion and neck supple.  Lymphadenopathy:     Cervical: No cervical adenopathy.  Skin:    General: Skin is warm and dry.     Capillary Refill: Capillary refill takes less than 2 seconds.  Neurological:     General: No focal deficit present.     Mental Status: She is alert and oriented to person, place, and time.  Psychiatric:        Mood and Affect: Mood normal.        Behavior: Behavior normal.        Thought Content: Thought  content normal.        Judgment: Judgment normal.     Results for orders placed or performed in visit on 07/13/22  POCT glycosylated hemoglobin (Hb A1C)  Result Value Ref Range   Hemoglobin A1C     HbA1c POC (<> result, manual entry) 8.1 4.0 - 5.6 %   HbA1c, POC (prediabetic range)     HbA1c, POC (controlled diabetic range)    POCT UA - Microalbumin  Result Value Ref Range   Microalbumin Ur, POC 10 mg/L   Creatinine, POC 10 mg/dL   Albumin/Creatinine Ratio, Urine, POC 30     Assessment & Plan    1. Type 2 diabetes mellitus with complication (HCC) Recently increased ozempic dose to 1 mg weekly. HgbA1c improved from 8.6 to 8.1. urine microalbumin is normal. No changes made to diabetic medication. Recheck in 4 months. - POCT glycosylated hemoglobin (Hb A1C) - POCT UA - Microalbumin  2. Hyperlipidemia associated with type 2 diabetes mellitus ([place]) Check fasting lipid panel prior to next visit and adjust crestor dosing as indicated.  - POCT glycosylated hemoglobin (Hb A1C) - POCT UA - Microalbumin  3. Hypertension associated with diabetes ([place]) Continue to control with diet and lifestyle modification.  - POCT glycosylated hemoglobin (Hb A1C) - POCT UA - Microalbumin  4. Acquired hypothyroidism Check thyroid panel  prior to next visit. Adjust levothyroxine dose as indicated.     Problem List Items Addressed This Visit       Cardiovascular and Mediastinum   Hypertension associated with diabetes ([place]) (Chronic)   Relevant Orders   POCT glycosylated hemoglobin (Hb A1C) (Completed)   POCT UA - Microalbumin (Completed)     Endocrine   Hyperlipidemia associated with type 2 diabetes mellitus (HCC) (Chronic)   Relevant Orders   POCT glycosylated hemoglobin (Hb A1C) (Completed)   POCT UA - Microalbumin (Completed)   Type 2 diabetes mellitus with complication (HCC) - Primary (Chronic)   Relevant Orders   POCT glycosylated hemoglobin (Hb A1C) (Completed)   POCT UA - Microalbumin  (Completed)   Hypothyroidism (Chronic)     Return in about 4 months (around 11/11/2022) for medicare wellness, FBW a week prior to visit.         Ronnell Freshwater, NP  [facility] [phone] (phone) [phone] (fax)  [place]

## 2022-07-15 ENCOUNTER — Other Ambulatory Visit: Payer: Self-pay | Admitting: Nurse Practitioner

## 2022-07-15 DIAGNOSIS — E1169 Type 2 diabetes mellitus with other specified complication: Secondary | ICD-10-CM

## 2022-07-19 ENCOUNTER — Other Ambulatory Visit: Payer: Self-pay | Admitting: Nurse Practitioner

## 2022-07-19 DIAGNOSIS — E118 Type 2 diabetes mellitus with unspecified complications: Secondary | ICD-10-CM

## 2022-07-19 NOTE — Telephone Encounter (Signed)
90 day sent 

## 2022-07-27 ENCOUNTER — Telehealth: Payer: Self-pay

## 2022-07-27 ENCOUNTER — Other Ambulatory Visit: Payer: Self-pay | Admitting: Nurse Practitioner

## 2022-07-27 DIAGNOSIS — F4323 Adjustment disorder with mixed anxiety and depressed mood: Secondary | ICD-10-CM

## 2022-07-27 DIAGNOSIS — E118 Type 2 diabetes mellitus with unspecified complications: Secondary | ICD-10-CM

## 2022-07-27 MED ORDER — SEMAGLUTIDE (2 MG/DOSE) 8 MG/3ML ~~LOC~~ SOPN: 2.0000 mg | PEN_INJECTOR | SUBCUTANEOUS | 2 refills | Status: DC

## 2022-07-27 NOTE — Telephone Encounter (Signed)
L.O.V: 07/13/22  N.O.V: 11/11/22  L.R.F: 04/26/22 Citalopram 90 tab 0 refill   Refill sent

## 2022-07-27 NOTE — Telephone Encounter (Signed)
Pt is requesting a refill on: Semaglutide, 2 MG/DOSE, 8 MG/3ML SOPN   Rx from 07/13/22 says NO PRINT. Please resend the Rx.

## 2022-07-27 NOTE — Telephone Encounter (Signed)
Prescription has been resent

## 2022-08-09 ENCOUNTER — Other Ambulatory Visit: Payer: Self-pay | Admitting: Nurse Practitioner

## 2022-08-09 DIAGNOSIS — E039 Hypothyroidism, unspecified: Secondary | ICD-10-CM

## 2022-10-12 ENCOUNTER — Other Ambulatory Visit: Payer: Self-pay | Admitting: Nurse Practitioner

## 2022-10-12 DIAGNOSIS — E1169 Type 2 diabetes mellitus with other specified complication: Secondary | ICD-10-CM

## 2022-10-13 ENCOUNTER — Other Ambulatory Visit: Payer: Self-pay | Admitting: Family Medicine

## 2022-10-13 DIAGNOSIS — E1169 Type 2 diabetes mellitus with other specified complication: Secondary | ICD-10-CM

## 2022-10-13 DIAGNOSIS — E039 Hypothyroidism, unspecified: Secondary | ICD-10-CM

## 2022-10-13 DIAGNOSIS — E118 Type 2 diabetes mellitus with unspecified complications: Secondary | ICD-10-CM

## 2022-10-13 DIAGNOSIS — E1159 Type 2 diabetes mellitus with other circulatory complications: Secondary | ICD-10-CM

## 2022-10-13 DIAGNOSIS — Z0000 Encounter for general adult medical examination without abnormal findings: Secondary | ICD-10-CM

## 2022-10-18 ENCOUNTER — Other Ambulatory Visit: Payer: Self-pay | Admitting: Nurse Practitioner

## 2022-10-18 DIAGNOSIS — E118 Type 2 diabetes mellitus with unspecified complications: Secondary | ICD-10-CM

## 2022-10-18 NOTE — Telephone Encounter (Signed)
Refill to last until upcoming appt 11/11/22

## 2022-10-21 ENCOUNTER — Telehealth: Payer: Self-pay

## 2022-10-21 NOTE — Telephone Encounter (Signed)
Called patient to schedule Medicare Annual Wellness Visit (AWV). Left message for patient to call back and schedule Medicare Annual Wellness Visit (AWV).  Last date of AWV: 10/28/21  Please schedule an appointment at any time On Annual Wellness Visit Schedule.     Agnes Lawrence, CMA (AAMA)  [facility]- AWV Program [phone]

## 2022-10-25 ENCOUNTER — Other Ambulatory Visit: Payer: Self-pay | Admitting: Family Medicine

## 2022-10-25 DIAGNOSIS — F4323 Adjustment disorder with mixed anxiety and depressed mood: Secondary | ICD-10-CM

## 2022-10-25 DIAGNOSIS — E118 Type 2 diabetes mellitus with unspecified complications: Secondary | ICD-10-CM

## 2022-11-04 ENCOUNTER — Other Ambulatory Visit: Payer: Medicare PPO

## 2022-11-04 DIAGNOSIS — E1159 Type 2 diabetes mellitus with other circulatory complications: Secondary | ICD-10-CM | POA: Diagnosis not present

## 2022-11-04 DIAGNOSIS — I152 Hypertension secondary to endocrine disorders: Secondary | ICD-10-CM | POA: Diagnosis not present

## 2022-11-04 DIAGNOSIS — E118 Type 2 diabetes mellitus with unspecified complications: Secondary | ICD-10-CM

## 2022-11-04 DIAGNOSIS — E1169 Type 2 diabetes mellitus with other specified complication: Secondary | ICD-10-CM | POA: Diagnosis not present

## 2022-11-04 DIAGNOSIS — E039 Hypothyroidism, unspecified: Secondary | ICD-10-CM | POA: Diagnosis not present

## 2022-11-04 DIAGNOSIS — Z0000 Encounter for general adult medical examination without abnormal findings: Secondary | ICD-10-CM

## 2022-11-04 DIAGNOSIS — E785 Hyperlipidemia, unspecified: Secondary | ICD-10-CM | POA: Diagnosis not present

## 2022-11-05 LAB — CBC WITH DIFFERENTIAL/PLATELET
Basophils Absolute: 0 10*3/uL (ref 0.0–0.2)
Basos: 1 %
EOS (ABSOLUTE): 0.1 10*3/uL (ref 0.0–0.4)
Eos: 1 %
Hematocrit: 42.3 % (ref 34.0–46.6)
Hemoglobin: 13.8 g/dL (ref 11.1–15.9)
Immature Grans (Abs): 0 10*3/uL (ref 0.0–0.1)
Immature Granulocytes: 0 %
Lymphocytes Absolute: 1.5 10*3/uL (ref 0.7–3.1)
Lymphs: 23 %
MCH: 29.4 pg (ref 26.6–33.0)
MCHC: 32.6 g/dL (ref 31.5–35.7)
MCV: 90 fL (ref 79–97)
Monocytes Absolute: 0.6 10*3/uL (ref 0.1–0.9)
Monocytes: 9 %
Neutrophils Absolute: 4.5 10*3/uL (ref 1.4–7.0)
Neutrophils: 66 %
Platelets: 285 10*3/uL (ref 150–450)
RBC: 4.69 x10E6/uL (ref 3.77–5.28)
RDW: 13.1 % (ref 11.7–15.4)
WBC: 6.7 10*3/uL (ref 3.4–10.8)

## 2022-11-05 LAB — COMPREHENSIVE METABOLIC PANEL
ALT: 20 IU/L (ref 0–32)
AST: 14 IU/L (ref 0–40)
Albumin/Globulin Ratio: 2.2 (ref 1.2–2.2)
Albumin: 4.2 g/dL (ref 3.8–4.8)
Alkaline Phosphatase: 85 IU/L (ref 44–121)
BUN/Creatinine Ratio: 20 (ref 12–28)
BUN: 13 mg/dL (ref 8–27)
Bilirubin Total: 0.9 mg/dL (ref 0.0–1.2)
CO2: 23 mmol/L (ref 20–29)
Calcium: 9.2 mg/dL (ref 8.7–10.3)
Chloride: 101 mmol/L (ref 96–106)
Creatinine, Ser: 0.66 mg/dL (ref 0.57–1.00)
Globulin, Total: 1.9 g/dL (ref 1.5–4.5)
Glucose: 128 mg/dL — ABNORMAL HIGH (ref 70–99)
Potassium: 4 mmol/L (ref 3.5–5.2)
Sodium: 139 mmol/L (ref 134–144)
Total Protein: 6.1 g/dL (ref 6.0–8.5)
eGFR: 90 mL/min/{1.73_m2} (ref >59)

## 2022-11-05 LAB — T3, FREE: T3, Free: 2.2 pg/mL (ref 2.0–4.4)

## 2022-11-05 LAB — T4: T4, Total: 8.4 ug/dL (ref 4.5–12.0)

## 2022-11-05 LAB — LIPID PANEL
Chol/HDL Ratio: 3.2 ratio (ref 0.0–4.4)
Cholesterol, Total: 150 mg/dL (ref 100–199)
HDL: 47 mg/dL (ref >39)
LDL Chol Calc (NIH): 77 mg/dL (ref 0–99)
Triglycerides: 152 mg/dL — ABNORMAL HIGH (ref 0–149)
VLDL Cholesterol Cal: 26 mg/dL (ref 5–40)

## 2022-11-05 LAB — TSH: TSH: 2.4 u[IU]/mL (ref 0.450–4.500)

## 2022-11-05 LAB — HEMOGLOBIN A1C
Est. average glucose Bld gHb Est-mCnc: 160 mg/dL
Hgb A1c MFr Bld: 7.2 % — ABNORMAL HIGH (ref 4.8–5.6)

## 2022-11-09 ENCOUNTER — Other Ambulatory Visit: Payer: Self-pay | Admitting: Nurse Practitioner

## 2022-11-09 DIAGNOSIS — E039 Hypothyroidism, unspecified: Secondary | ICD-10-CM

## 2022-11-11 ENCOUNTER — Ambulatory Visit (INDEPENDENT_AMBULATORY_CARE_PROVIDER_SITE_OTHER): Payer: Medicare PPO

## 2022-11-11 ENCOUNTER — Encounter: Payer: Medicare PPO | Admitting: Family Medicine

## 2022-11-11 VITALS — Ht 66.0 in | Wt 148.0 lb

## 2022-11-11 DIAGNOSIS — Z0000 Encounter for general adult medical examination without abnormal findings: Secondary | ICD-10-CM

## 2022-11-11 NOTE — Progress Notes (Signed)
Subjective:   Amanda Douglas is a 77 y.o. female who presents for Medicare Annual (Subsequent) preventive examination.  Review of Systems    Virtual Visit via Telephone Note  I connected with  Amanda Douglas on 11/11/22 at  3:00 PM EDT by telephone and verified that I am speaking with the correct person using two identifiers.  Location: Patient: Home Provider: Office Persons participating in the virtual visit: patient/Nurse Health Advisor   I discussed the limitations, risks, security and privacy concerns of performing an evaluation and management service by telephone and the availability of in person appointments. The patient expressed understanding and agreed to proceed.  Interactive audio and video telecommunications were attempted between this nurse and patient, however failed, due to patient having technical difficulties OR patient did not have access to video capability.  We continued and completed visit with audio only.  Some vital signs may be absent or patient reported.   Tillie Rung, LPN  Cardiac Risk Factors include: advanced age (>71men, >14 women);diabetes mellitus;hypertension     Objective:    Today's Vitals   11/11/22 1558  Weight: 148 lb (67.1 kg)  Height: 5\' 6"  (1.676 m)   Body mass index is 23.89 kg/m.     11/11/2022    4:04 PM 03/03/2017   10:40 AM 04/19/2016   11:19 AM  Advanced Directives  Does Patient Have a Medical Advance Directive? No No No  Would patient like information on creating a medical advance directive? No - Patient declined No - Patient declined No - patient declined information    Current Medications (verified) Outpatient Encounter Medications as of 11/11/2022  Medication Sig   Cholecalciferol (VITAMIN D3) 5000 units CAPS Take 1 capsule by mouth daily.   citalopram (CELEXA) 20 MG tablet TAKE 1 TABLET BY MOUTH DAILY.   fexofenadine (ALLEGRA) 180 MG tablet Take 1 tablet (180 mg total) by mouth daily.   JARDIANCE 25 MG TABS  tablet TAKE 1 TABLET (25 MG TOTAL) BY MOUTH DAILY BEFORE BREAKFAST.   levothyroxine (SYNTHROID) 75 MCG tablet TAKE 1 TABLET (75 MCG TOTAL) BY MOUTH DAILY BEFORE BREAKFAST.   Lysine 1000 MG TABS Take 1 tablet by mouth daily.   montelukast (SINGULAIR) 10 MG tablet TAKE 1 TABLET BY MOUTH AT BEDTIME. (Patient not taking: Reported on 07/13/2022)   Multiple Vitamin (MULTIVITAMIN) tablet Take 1 tablet by mouth daily.   OZEMPIC, 2 MG/DOSE, 8 MG/3ML SOPN INJECT 2 MG AS DIRECTED ONCE A WEEK.   pantoprazole (PROTONIX) 20 MG tablet Take 1 tablet (20 mg total) by mouth daily. (Patient not taking: Reported on 07/13/2022)   rosuvastatin (CRESTOR) 20 MG tablet TAKE 1 TABLET (20 MG TOTAL) BY MOUTH AT BEDTIME.   sucralfate (CARAFATE) 1 g tablet Take 1 tablet (1 g total) by mouth 3 (three) times daily as needed.   triamcinolone (NASACORT) 55 MCG/ACT AERO nasal inhaler Place 2 sprays into the nose daily.   No facility-administered encounter medications on file as of 11/11/2022.    Allergies (verified) Codeine and Metformin and related   History: Past Medical History:  Diagnosis Date   Depression    Diabetes mellitus without complication (HCC)    Hyperlipidemia    Thyroid disease    Past Surgical History:  Procedure Laterality Date   ANKLE FRACTURE SURGERY     CESAREAN SECTION     Family History  Problem Relation Age of Onset   Diabetes Mother    Hypertension Mother    Thyroid disease Mother  Depression Mother    COPD Sister    Arthritis Sister    Healthy Brother    Healthy Daughter    Diabetes Son    Cancer Sister        squamous cell   Hypertension Son    Social History   Socioeconomic History   Marital status: Married    Spouse name: Not on file   Number of children: Not on file   Years of education: Not on file   Highest education level: Not on file  Occupational History   Not on file  Tobacco Use   Smoking status: Former    Packs/day: 1.00    Years: 15.00    Additional pack  years: 0.00    Total pack years: 15.00    Types: Cigarettes    Quit date: 06/14/1977    Years since quitting: 45.4   Smokeless tobacco: Never  Vaping Use   Vaping Use: Never used  Substance and Sexual Activity   Alcohol use: No   Drug use: No   Sexual activity: Never  Other Topics Concern   Not on file  Social History Narrative   Not on file   Social Determinants of Health   Financial Resource Strain: Low Risk  (11/11/2022)   Overall Financial Resource Strain (CARDIA)    Difficulty of Paying Living Expenses: Not hard at all  Food Insecurity: No Food Insecurity (11/11/2022)   Hunger Vital Sign    Worried About Running Out of Food in the Last Year: Never true    Ran Out of Food in the Last Year: Never true  Transportation Needs: No Transportation Needs (11/11/2022)   PRAPARE - Administrator, Civil Service (Medical): No    Lack of Transportation (Non-Medical): No  Physical Activity: Inactive (11/11/2022)   Exercise Vital Sign    Days of Exercise per Week: 0 days    Minutes of Exercise per Session: 0 min  Stress: No Stress Concern Present (11/11/2022)   [employer] of Occupational Health - Occupational Stress Questionnaire    Feeling of Stress : Not at all  Social Connections: Socially Integrated (11/11/2022)   Social Connection and Isolation Panel [NHANES]    Frequency of Communication with Friends and Family: More than three times a week    Frequency of Social Gatherings with Friends and Family: More than three times a week    Attends Religious Services: More than 4 times per year    Active Member of [employer] or Organizations: Yes    Attends Engineer, structural: More than 4 times per year    Marital Status: Married    Tobacco Counseling Counseling given: Not Answered   Clinical Intake:  Pre-visit preparation completed: No  Pain : No/denies pain    Nutrition Risk Assessment:  Has the patient had any N/V/D within the last 2 months?  No  Does  the patient have any non-healing wounds?  No  Has the patient had any unintentional weight loss or weight gain?  No   Diabetes:  Is the patient diabetic?  Yes  If diabetic, was a CBG obtained today?  No  Did the patient bring in their glucometer from home?  No  How often do you monitor your CBG's? PRN.   Financial Strains and Diabetes Management:  Are you having any financial strains with the device, your supplies or your medication? No .  Does the patient want to be seen by Chronic Care Management for management of their  diabetes?  No  Would the patient like to be referred to a Nutritionist or for Diabetic Management?  No   Diabetic Exams:  Diabetic Eye Exam: Completed  . Overdue for diabetic eye exam. Pt has been advised about the importance in completing this exam. A referral has been placed today. Message sent to referral coordinator for scheduling purposes. Advised pt to expect a call from office referred to regarding appt.  Diabetic Foot Exam: Completed  . Pt has been advised about the importance in completing this exam. Pt is scheduled for diabetic foot exam on Followed by PCP.   BMI - recorded: 23.89 Nutritional Status: BMI of 19-24  Normal Nutritional Risks: None Diabetes: Yes CBG done?: No Did pt. bring in CBG monitor from home?: No  How often do you need to have someone help you when you read instructions, pamphlets, or other written materials from your doctor or pharmacy?: 1 - Never  Diabetic?  Yes  Interpreter Needed?: No  Information entered by :: Theresa Mulligan LPN   Activities of Daily Living    11/11/2022    4:02 PM 07/13/2022    3:22 PM  In your present state of health, do you have any difficulty performing the following activities:  Hearing? 0 0  Vision? 0 0  Difficulty concentrating or making decisions? 0 0  Walking or climbing stairs? 0 0  Dressing or bathing? 0 0  Doing errands, shopping? 0 0  Preparing Food and eating ? N   Using the Toilet? N    In the past six months, have you accidently leaked urine? N   Do you have problems with loss of bowel control? N   Managing your Medications? N   Managing your Finances? N   Housekeeping or managing your Housekeeping? N     Patient Care Team: Melida Quitter, PA as PCP - General (Family Medicine) Dorna Leitz, MD as Referring Physician (Internal Medicine) Gastroenterology, [place] as Consulting Physician (Gastroenterology) Romero Belling, MD (Inactive) as Consulting Physician (Endocrinology) Harold Hedge, MD as Consulting Physician (Obstetrics and Gynecology)  Indicate any recent Medical Services you may have received from other than [facility] providers in the past year (date may be approximate).     Assessment:   This is a routine wellness examination for Amanda.  Hearing/Vision screen Hearing Screening - Comments:: Denies hearing difficulties   Vision Screening - Comments:: Wears reading glasses - up to date with routine eye exams with  [facility]  Dietary issues and exercise activities discussed: Exercise limited by: None identified   Goals Addressed               This Visit's Progress     No current goals (pt-stated)         Depression Screen    11/11/2022    4:01 PM 07/13/2022    3:22 PM 03/01/2022    3:56 PM 12/28/2021    3:50 PM 11/03/2021    2:37 PM 10/28/2021    9:28 AM 07/29/2021    3:09 PM  PHQ 2/9 Scores  PHQ - 2 Score 0 0 0 0 0 0 0  PHQ- 9 Score  0 2 0 2  1    Fall Risk    11/11/2022    4:03 PM 07/13/2022    3:23 PM 03/01/2022    3:56 PM 12/28/2021    3:50 PM 11/03/2021    2:37 PM  Fall Risk   Falls in the past year? 0 1 0  0 0  Number falls in past yr: 0 0 0 0 0  Injury with Fall? 0 0 0 0 0  Risk for fall due to : No Fall Risks  No Fall Risks No Fall Risks No Fall Risks  Follow up Falls prevention discussed Falls evaluation completed Falls evaluation completed Falls evaluation completed Falls evaluation completed    FALL RISK PREVENTION  PERTAINING TO THE HOME:  Any stairs in or around the home? Yes  If so, are there any without handrails? No  Home free of loose throw rugs in walkways, pet beds, electrical cords, etc? Yes  Adequate lighting in your home to reduce risk of falls? Yes   ASSISTIVE DEVICES UTILIZED TO PREVENT FALLS:  Life alert? No  Use of a cane, walker or w/c? No  Grab bars in the bathroom? Yes  Shower chair or bench in shower? Yes  Elevated toilet seat or a handicapped toilet? No   TIMED UP AND GO:  Was the test performed? No . Audio visit  Cognitive Function:    05/01/2019   11:33 AM  MMSE - Mini Mental State Exam  Orientation to time 5  Orientation to Place 5  Registration 3  Attention/ Calculation 5  Recall 3  Language- name 2 objects 2  Language- repeat 1  Language- follow 3 step command-comments Virtual appointment  Language-read & follow direction-comments virtual appointment  Write a sentence-comments virtual appointment  Copy design-comments virtual appointment        11/11/2022    4:04 PM 10/28/2021    9:28 AM 10/22/2020   10:39 AM 05/01/2019   11:28 AM  6CIT Screen  What Year? 0 points 0 points 0 points 0 points  What month? 0 points 0 points 0 points 0 points  What time? 0 points 0 points 0 points 0 points  Count back from 20 0 points 0 points 2 points 0 points  Months in reverse 0 points 0 points 0 points 0 points  Repeat phrase 0 points 0 points 0 points 4 points  Total Score 0 points 0 points 2 points 4 points    Immunizations There is no immunization history for the selected administration types on file for this patient.  TDAP status: Due, Education has been provided regarding the importance of this vaccine. Advised may receive this vaccine at local pharmacy or Health Dept. Aware to provide a copy of the vaccination record if obtained from local pharmacy or Health Dept. Verbalized acceptance and understanding.  Flu Vaccine status: Up to date    Covid-19 vaccine  status: Declined, Education has been provided regarding the importance of this vaccine but patient still declined. Advised may receive this vaccine at local pharmacy or Health Dept.or vaccine clinic. Aware to provide a copy of the vaccination record if obtained from local pharmacy or Health Dept. Verbalized acceptance and understanding.  Qualifies for Shingles Vaccine? Yes   Zostavax completed No   Shingrix Completed?: No.    Education has been provided regarding the importance of this vaccine. Patient has been advised to call insurance company to determine out of pocket expense if they have not yet received this vaccine. Advised may also receive vaccine at local pharmacy or Health Dept. Verbalized acceptance and understanding.  Screening Tests Health Maintenance  Topic Date Due   DTaP/Tdap/Td (1 - Tdap) Never done   OPHTHALMOLOGY EXAM  04/11/2021   FOOT EXAM  08/26/2021   COVID-19 Vaccine (1) 11/27/2022 (Originally 07/10/1950)   Zoster Vaccines- Shingrix (1 of  2) 02/11/2023 (Originally 07/11/1964)   Pneumonia Vaccine 58+ Years old (1 of 1 - PCV) 07/14/2023 (Originally 07/10/2009)   DEXA SCAN  07/14/2023 (Originally 07/10/2009)   INFLUENZA VACCINE  01/13/2023   HEMOGLOBIN A1C  05/07/2023   Diabetic kidney evaluation - Urine ACR  07/14/2023   Diabetic kidney evaluation - eGFR measurement  11/04/2023   Medicare Annual Wellness (AWV)  11/11/2023   Hepatitis C Screening  Completed   HPV VACCINES  Aged Out   Colonoscopy  Discontinued    Health Maintenance  Health Maintenance Due  Topic Date Due   DTaP/Tdap/Td (1 - Tdap) Never done   OPHTHALMOLOGY EXAM  04/11/2021   FOOT EXAM  08/26/2021    Colorectal cancer screening: No longer required.       Lung Cancer Screening: (Low Dose CT Chest recommended if Age 65-80 years, 30 pack-year currently smoking OR have quit w/in 15years.) does not qualify.     Additional Screening:  Hepatitis C Screening: does qualify; Completed  05/01/19  Vision Screening: Recommended annual ophthalmology exams for early detection of glaucoma and other disorders of the eye. Is the patient up to date with their annual eye exam?  Yes  Who is the provider or what is the name of the office in which the patient attends annual eye exams? [facility] If pt is not established with a provider, would they like to be referred to a provider to establish care? No .   Dental Screening: Recommended annual dental exams for proper oral hygiene  Community Resource Referral / Chronic Care Management: CRR required this visit?  No   CCM required this visit?  No      Plan:     I have personally reviewed and noted the following in the patient's chart:   Medical and social history Use of alcohol, tobacco or illicit drugs  Current medications and supplements including opioid prescriptions. Patient is not currently taking opioid prescriptions. Functional ability and status Nutritional status Physical activity Advanced directives List of other physicians Hospitalizations, surgeries, and ER visits in previous 12 months Vitals Screenings to include cognitive, depression, and falls Referrals and appointments  In addition, I have reviewed and discussed with patient certain preventive protocols, quality metrics, and best practice recommendations. A written personalized care plan for preventive services as well as general preventive health recommendations were provided to patient.     Tillie Rung, LPN   [id number]   Nurse Notes: None

## 2022-11-11 NOTE — Patient Instructions (Addendum)
[name] , Thank you for taking time to come for your Medicare Wellness Visit. I appreciate your ongoing commitment to your health goals. Please review the following plan we discussed and let me know if I can assist you in the future.   These are the goals we discussed:  Goals       No current goals (pt-stated)        This is a list of the screening recommended for you and due dates:  Health Maintenance  Topic Date Due   DTaP/Tdap/Td vaccine (1 - Tdap) Never done   Eye exam for diabetics  04/11/2021   Complete foot exam   08/26/2021   COVID-19 Vaccine (1) 11/27/2022*   Zoster (Shingles) Vaccine (1 of 2) 02/11/2023*   Pneumonia Vaccine (1 of 1 - PCV) 07/14/2023*   DEXA scan (bone density measurement)  07/14/2023*   Flu Shot  01/13/2023   Hemoglobin A1C  05/07/2023   Yearly kidney health urinalysis for diabetes  07/14/2023   Yearly kidney function blood test for diabetes  11/04/2023   Medicare Annual Wellness Visit  11/11/2023   Hepatitis C Screening  Completed   HPV Vaccine  Aged Out   Colon Cancer Screening  Discontinued  *Topic was postponed. The date shown is not the original due date.    Advanced directives: Advance directive discussed with you today. Even though you declined this today, please call our office should you change your mind, and we can give you the proper paperwork for you to fill out.   Conditions/risks identified: None  Next appointment: Follow up in one year for your annual wellness visit     Preventive Care 78 Years and Older, Female Preventive care refers to lifestyle choices and visits with your health care provider that can promote health and wellness. What does preventive care include? A yearly physical exam. This is also called an annual well check. Dental exams once or twice a year. Routine eye exams. Ask your health care provider how often you should have your eyes checked. Personal lifestyle choices, including: Daily care of your teeth and  gums. Regular physical activity. Eating a healthy diet. Avoiding tobacco and drug use. Limiting alcohol use. Practicing safe sex. Taking low-dose aspirin every day. Taking vitamin and mineral supplements as recommended by your health care provider. What happens during an annual well check? The services and screenings done by your health care provider during your annual well check will depend on your age, overall health, lifestyle risk factors, and family history of disease. Counseling  Your health care provider may ask you questions about your: Alcohol use. Tobacco use. Drug use. Emotional well-being. Home and relationship well-being. Sexual activity. Eating habits. History of falls. Memory and ability to understand (cognition). Work and work Astronomer. Reproductive health. Screening  You may have the following tests or measurements: Height, weight, and BMI. Blood pressure. Lipid and cholesterol levels. These may be checked every 5 years, or more frequently if you are over 86 years old. Skin check. Lung cancer screening. You may have this screening every year starting at age 10 if you have a 30-pack-year history of smoking and currently smoke or have quit within the past 15 years. Fecal occult blood test (FOBT) of the stool. You may have this test every year starting at age 78. Flexible sigmoidoscopy or colonoscopy. You may have a sigmoidoscopy every 5 years or a colonoscopy every 10 years starting at age 25. Hepatitis C blood test. Hepatitis B blood test. Sexually  transmitted disease (STD) testing. Diabetes screening. This is done by checking your blood sugar (glucose) after you have not eaten for a while (fasting). You may have this done every 1-3 years. Bone density scan. This is done to screen for osteoporosis. You may have this done starting at age 78. Mammogram. This may be done every 1-2 years. Talk to your health care provider about how often you should have regular  mammograms. Talk with your health care provider about your test results, treatment options, and if necessary, the need for more tests. Vaccines  Your health care provider may recommend certain vaccines, such as: Influenza vaccine. This is recommended every year. Tetanus, diphtheria, and acellular pertussis (Tdap, Td) vaccine. You may need a Td booster every 10 years. Zoster vaccine. You may need this after age 47. Pneumococcal 13-valent conjugate (PCV13) vaccine. One dose is recommended after age 78. Pneumococcal polysaccharide (PPSV23) vaccine. One dose is recommended after age 78. Talk to your health care provider about which screenings and vaccines you need and how often you need them. This information is not intended to replace advice given to you by your health care provider. Make sure you discuss any questions you have with your health care provider. Document Released: 06/27/2015 Document Revised: 02/18/2016 Document Reviewed: 04/01/2015 Elsevier Interactive Patient Education  2017 [employer].  Fall Prevention in the Home Falls can cause injuries. They can happen to people of all ages. There are many things you can do to make your home safe and to help prevent falls. What can I do on the outside of my home? Regularly fix the edges of walkways and driveways and fix any cracks. Remove anything that might make you trip as you walk through a door, such as a raised step or threshold. Trim any bushes or trees on the path to your home. Use bright outdoor lighting. Clear any walking paths of anything that might make someone trip, such as rocks or tools. Regularly check to see if handrails are loose or broken. Make sure that both sides of any steps have handrails. Any raised decks and porches should have guardrails on the edges. Have any leaves, snow, or ice cleared regularly. Use sand or salt on walking paths during winter. Clean up any spills in your garage right away. This includes oil  or grease spills. What can I do in the bathroom? Use night lights. Install grab bars by the toilet and in the tub and shower. Do not use towel bars as grab bars. Use non-skid mats or decals in the tub or shower. If you need to sit down in the shower, use a plastic, non-slip stool. Keep the floor dry. Clean up any water that spills on the floor as soon as it happens. Remove soap buildup in the tub or shower regularly. Attach bath mats securely with double-sided non-slip rug tape. Do not have throw rugs and other things on the floor that can make you trip. What can I do in the bedroom? Use night lights. Make sure that you have a light by your bed that is easy to reach. Do not use any sheets or blankets that are too big for your bed. They should not hang down onto the floor. Have a firm chair that has side arms. You can use this for support while you get dressed. Do not have throw rugs and other things on the floor that can make you trip. What can I do in the kitchen? Clean up any spills right away. Avoid  walking on wet floors. Keep items that you use a lot in easy-to-reach places. If you need to reach something above you, use a strong step stool that has a grab bar. Keep electrical cords out of the way. Do not use floor polish or wax that makes floors slippery. If you must use wax, use non-skid floor wax. Do not have throw rugs and other things on the floor that can make you trip. What can I do with my stairs? Do not leave any items on the stairs. Make sure that there are handrails on both sides of the stairs and use them. Fix handrails that are broken or loose. Make sure that handrails are as long as the stairways. Check any carpeting to make sure that it is firmly attached to the stairs. Fix any carpet that is loose or worn. Avoid having throw rugs at the top or bottom of the stairs. If you do have throw rugs, attach them to the floor with carpet tape. Make sure that you have a light  switch at the top of the stairs and the bottom of the stairs. If you do not have them, ask someone to add them for you. What else can I do to help prevent falls? Wear shoes that: Do not have high heels. Have rubber bottoms. Are comfortable and fit you well. Are closed at the toe. Do not wear sandals. If you use a stepladder: Make sure that it is fully opened. Do not climb a closed stepladder. Make sure that both sides of the stepladder are locked into place. Ask someone to hold it for you, if possible. Clearly mark and make sure that you can see: Any grab bars or handrails. First and last steps. Where the edge of each step is. Use tools that help you move around (mobility aids) if they are needed. These include: Canes. Walkers. Scooters. Crutches. Turn on the lights when you go into a dark area. Replace any light bulbs as soon as they burn out. Set up your furniture so you have a clear path. Avoid moving your furniture around. If any of your floors are uneven, fix them. If there are any pets around you, be aware of where they are. Review your medicines with your doctor. Some medicines can make you feel dizzy. This can increase your chance of falling. Ask your doctor what other things that you can do to help prevent falls. This information is not intended to replace advice given to you by your health care provider. Make sure you discuss any questions you have with your health care provider. Document Released: 03/27/2009 Document Revised: 11/06/2015 Document Reviewed: 07/05/2014 Elsevier Interactive Patient Education  2017 [employer].

## 2022-11-18 ENCOUNTER — Other Ambulatory Visit: Payer: Self-pay | Admitting: Family Medicine

## 2022-11-18 DIAGNOSIS — E118 Type 2 diabetes mellitus with unspecified complications: Secondary | ICD-10-CM

## 2023-01-10 ENCOUNTER — Other Ambulatory Visit: Payer: Self-pay | Admitting: Family Medicine

## 2023-01-10 DIAGNOSIS — E1169 Type 2 diabetes mellitus with other specified complication: Secondary | ICD-10-CM

## 2023-01-17 ENCOUNTER — Other Ambulatory Visit: Payer: Self-pay | Admitting: Family Medicine

## 2023-01-17 DIAGNOSIS — E118 Type 2 diabetes mellitus with unspecified complications: Secondary | ICD-10-CM

## 2023-02-04 ENCOUNTER — Other Ambulatory Visit: Payer: Self-pay | Admitting: Family Medicine

## 2023-02-04 DIAGNOSIS — E039 Hypothyroidism, unspecified: Secondary | ICD-10-CM

## 2023-02-22 ENCOUNTER — Other Ambulatory Visit: Payer: Self-pay | Admitting: Family Medicine

## 2023-02-22 DIAGNOSIS — E118 Type 2 diabetes mellitus with unspecified complications: Secondary | ICD-10-CM

## 2023-02-28 ENCOUNTER — Other Ambulatory Visit: Payer: Self-pay | Admitting: Family Medicine

## 2023-02-28 DIAGNOSIS — I152 Hypertension secondary to endocrine disorders: Secondary | ICD-10-CM

## 2023-02-28 DIAGNOSIS — E118 Type 2 diabetes mellitus with unspecified complications: Secondary | ICD-10-CM

## 2023-02-28 DIAGNOSIS — E1169 Type 2 diabetes mellitus with other specified complication: Secondary | ICD-10-CM

## 2023-02-28 DIAGNOSIS — E039 Hypothyroidism, unspecified: Secondary | ICD-10-CM

## 2023-03-02 ENCOUNTER — Other Ambulatory Visit: Payer: Medicare PPO

## 2023-03-02 DIAGNOSIS — E1159 Type 2 diabetes mellitus with other circulatory complications: Secondary | ICD-10-CM | POA: Diagnosis not present

## 2023-03-02 DIAGNOSIS — E118 Type 2 diabetes mellitus with unspecified complications: Secondary | ICD-10-CM

## 2023-03-02 DIAGNOSIS — I152 Hypertension secondary to endocrine disorders: Secondary | ICD-10-CM

## 2023-03-02 DIAGNOSIS — E039 Hypothyroidism, unspecified: Secondary | ICD-10-CM

## 2023-03-02 DIAGNOSIS — E1169 Type 2 diabetes mellitus with other specified complication: Secondary | ICD-10-CM | POA: Diagnosis not present

## 2023-03-02 DIAGNOSIS — E785 Hyperlipidemia, unspecified: Secondary | ICD-10-CM | POA: Diagnosis not present

## 2023-03-03 LAB — THYROID PANEL WITH TSH
Free Thyroxine Index: 3.3 (ref 1.2–4.9)
T3 Uptake Ratio: 30 % (ref 24–39)
T4, Total: 10.9 ug/dL (ref 4.5–12.0)
TSH: 0.873 u[IU]/mL (ref 0.450–4.500)

## 2023-03-03 LAB — HEMOGLOBIN A1C
Est. average glucose Bld gHb Est-mCnc: 166 mg/dL
Hgb A1c MFr Bld: 7.4 % — ABNORMAL HIGH (ref 4.8–5.6)

## 2023-03-03 LAB — COMPREHENSIVE METABOLIC PANEL
ALT: 14 IU/L (ref 0–32)
AST: 10 IU/L (ref 0–40)
Albumin: 4.5 g/dL (ref 3.8–4.8)
Alkaline Phosphatase: 75 IU/L (ref 44–121)
BUN/Creatinine Ratio: 24 (ref 12–28)
BUN: 20 mg/dL (ref 8–27)
Bilirubin Total: 0.9 mg/dL (ref 0.0–1.2)
CO2: 21 mmol/L (ref 20–29)
Calcium: 10 mg/dL (ref 8.7–10.3)
Chloride: 102 mmol/L (ref 96–106)
Creatinine, Ser: 0.85 mg/dL (ref 0.57–1.00)
Globulin, Total: 1.9 g/dL (ref 1.5–4.5)
Glucose: 125 mg/dL — ABNORMAL HIGH (ref 70–99)
Potassium: 5.1 mmol/L (ref 3.5–5.2)
Sodium: 139 mmol/L (ref 134–144)
Total Protein: 6.4 g/dL (ref 6.0–8.5)
eGFR: 70 mL/min/{1.73_m2} (ref >59)

## 2023-03-03 LAB — LIPID PANEL
Chol/HDL Ratio: 2.8 ratio (ref 0.0–4.4)
Cholesterol, Total: 159 mg/dL (ref 100–199)
HDL: 57 mg/dL (ref >39)
LDL Chol Calc (NIH): 73 mg/dL (ref 0–99)
Triglycerides: 176 mg/dL — ABNORMAL HIGH (ref 0–149)
VLDL Cholesterol Cal: 29 mg/dL (ref 5–40)

## 2023-03-09 ENCOUNTER — Ambulatory Visit: Payer: Medicare PPO | Admitting: Family Medicine

## 2023-03-09 ENCOUNTER — Encounter: Payer: Self-pay | Admitting: Family Medicine

## 2023-03-09 VITALS — BP 123/83 | HR 77 | Resp 18 | Ht 66.0 in | Wt 143.0 lb

## 2023-03-09 DIAGNOSIS — Z7984 Long term (current) use of oral hypoglycemic drugs: Secondary | ICD-10-CM

## 2023-03-09 DIAGNOSIS — E039 Hypothyroidism, unspecified: Secondary | ICD-10-CM

## 2023-03-09 DIAGNOSIS — E1159 Type 2 diabetes mellitus with other circulatory complications: Secondary | ICD-10-CM

## 2023-03-09 DIAGNOSIS — Z7985 Long-term (current) use of injectable non-insulin antidiabetic drugs: Secondary | ICD-10-CM | POA: Diagnosis not present

## 2023-03-09 DIAGNOSIS — I152 Hypertension secondary to endocrine disorders: Secondary | ICD-10-CM | POA: Diagnosis not present

## 2023-03-09 DIAGNOSIS — E785 Hyperlipidemia, unspecified: Secondary | ICD-10-CM

## 2023-03-09 DIAGNOSIS — E1169 Type 2 diabetes mellitus with other specified complication: Secondary | ICD-10-CM | POA: Diagnosis not present

## 2023-03-09 DIAGNOSIS — E118 Type 2 diabetes mellitus with unspecified complications: Secondary | ICD-10-CM | POA: Diagnosis not present

## 2023-03-09 MED ORDER — OZEMPIC (2 MG/DOSE) 8 MG/3ML ~~LOC~~ SOPN
2.0000 mg | PEN_INJECTOR | SUBCUTANEOUS | 2 refills | Status: DC
Start: 2023-03-09 — End: 2023-07-04

## 2023-03-09 MED ORDER — ROSUVASTATIN CALCIUM 20 MG PO TABS
20.0000 mg | ORAL_TABLET | Freq: Every day | ORAL | 0 refills | Status: DC
Start: 2023-03-09 — End: 2023-07-11

## 2023-03-09 MED ORDER — LEVOTHYROXINE SODIUM 75 MCG PO TABS
75.0000 ug | ORAL_TABLET | Freq: Every day | ORAL | 0 refills | Status: DC
Start: 2023-03-09 — End: 2023-05-09

## 2023-03-09 NOTE — Progress Notes (Signed)
Established Patient Office Visit  Subjective   Patient ID: Amanda Douglas, female    DOB: 1945-11-27  Age: 77 y.o. MRN: [id number]  Chief Complaint  Patient presents with   Diabetes   Hyperlipidemia   Hypertension    HPI Amanda Douglas is a 77 y.o. female presenting today for follow up of hypertension, hyperlipidemia, diabetes. Hypertension: Patient here for follow-up of elevated blood pressure. Pt denies chest pain, SOB, dizziness, edema, syncope, fatigue or heart palpitations.  Currently managing with lifestyle interventions. Hyperlipidemia: tolerating rosuvastatin 20 mg daily well with no myalgias or significant side effects.  The 10-year ASCVD risk score (Arnett DK, et al., 2019) is: 36% Diabetes: denies hypoglycemic events, wounds or sores that are not healing well, increased thirst or urination. Denies vision problems, eye exam overdue.  Not currently checking glucose levels at home. Taking Jardiance and Ozempic as prescribed without any side effects.  She does have sweets often which is likely why A1c has increased.  Outpatient Medications Prior to Visit  Medication Sig   Cholecalciferol (VITAMIN D3) 5000 units CAPS Take 1 capsule by mouth daily.   citalopram (CELEXA) 20 MG tablet TAKE 1 TABLET BY MOUTH DAILY.   fexofenadine (ALLEGRA) 180 MG tablet Take 1 tablet (180 mg total) by mouth daily.   JARDIANCE 25 MG TABS tablet TAKE 1 TABLET (25 MG TOTAL) BY MOUTH DAILY BEFORE BREAKFAST.   Lysine 1000 MG TABS Take 1 tablet by mouth daily.   Multiple Vitamin (MULTIVITAMIN) tablet Take 1 tablet by mouth daily.   sucralfate (CARAFATE) 1 g tablet Take 1 tablet (1 g total) by mouth 3 (three) times daily as needed.   triamcinolone (NASACORT) 55 MCG/ACT AERO nasal inhaler Place 2 sprays into the nose daily.   [DISCONTINUED] levothyroxine (SYNTHROID) 75 MCG tablet TAKE 1 TABLET (75 MCG TOTAL) BY MOUTH DAILY BEFORE BREAKFAST.   [DISCONTINUED] OZEMPIC, 2 MG/DOSE, 8 MG/3ML SOPN INJECT 2 MG  AS DIRECTED ONCE A WEEK.   [DISCONTINUED] rosuvastatin (CRESTOR) 20 MG tablet TAKE 1 TABLET (20 MG TOTAL) BY MOUTH AT BEDTIME.   [DISCONTINUED] montelukast (SINGULAIR) 10 MG tablet TAKE 1 TABLET BY MOUTH AT BEDTIME. (Patient not taking: Reported on 07/13/2022)   [DISCONTINUED] pantoprazole (PROTONIX) 20 MG tablet Take 1 tablet (20 mg total) by mouth daily. (Patient not taking: Reported on 07/13/2022)   No facility-administered medications prior to visit.    ROS Negative unless otherwise noted in HPI   Objective:     BP 123/83 (BP Location: Left Arm, Patient Position: Sitting, Cuff Size: Normal)   Pulse 77   Resp 18   Ht 5\' 6"  (1.676 m)   Wt 143 lb (64.9 kg)   SpO2 97%   BMI 23.08 kg/m   Physical Exam Constitutional:      General: She is not in acute distress.    Appearance: Normal appearance.  HENT:     Head: Normocephalic and atraumatic.  Cardiovascular:     Rate and Rhythm: Normal rate and regular rhythm.     Heart sounds: No murmur heard.    No friction rub. No gallop.  Pulmonary:     Effort: Pulmonary effort is normal. No respiratory distress.     Breath sounds: No wheezing, rhonchi or rales.  Skin:    General: Skin is warm and dry.  Neurological:     Mental Status: She is alert and oriented to person, place, and time.    Diabetic Foot Exam - Simple   Simple Foot Form Diabetic  Foot exam was performed with the following findings: Yes 03/09/2023  1:43 PM  Visual Inspection No deformities, no ulcerations, no other skin breakdown bilaterally: Yes Sensation Testing Intact to touch and monofilament testing bilaterally: Yes Pulse Check Posterior Tibialis and Dorsalis pulse intact bilaterally: Yes Comments     Assessment & Plan:  Hypertension associated with diabetes (HCC) Assessment & Plan: BP goal <130/80.  Stable, essentially at goal.  Continue managing with nonpharmacologic therapy.  Will continue to monitor.  Orders: -     Rosuvastatin Calcium; Take 1 tablet  (20 mg total) by mouth at bedtime.  Dispense: 90 tablet; Refill: 0  Type 2 diabetes mellitus with complication (HCC) Assessment & Plan: A1c increased from 7.2 to 7.4, barely below goal of less than 7.5.  Discussed working on diet changes and reducing sugar intake as well as pairing any high sugar/carb foods with protein or fiber to decrease blood sugar spikes.  Patient verbalized understanding.  Continue Jardiance 25 mg daily, Ozempic 2 mg weekly.  Repeat point-of-care A1c in 3 months and adjust management as needed at that time.  Foot exam completed today.  Advised her to have eye appointment done and discussed the importance of annual eye exams due to history of hypertension and diabetes.  Orders: -     Ozempic (2 MG/DOSE); Inject 2 mg into the skin once a week.  Dispense: 3 mL; Refill: 2  Hypothyroidism, unspecified type Assessment & Plan: Recent TSH within normal limits.  Continue levothyroxine 75 mcg daily.  Will continue to monitor, recheck thyroid labs in 6 months.  Orders: -     Levothyroxine Sodium; Take 1 tablet (75 mcg total) by mouth daily before breakfast.  Dispense: 90 tablet; Refill: 0  Hyperlipidemia associated with type 2 diabetes mellitus (HCC) Assessment & Plan: Last lipid panel: LDL 73, HDL 57, triglycerides 176.  LDL nearly at goal of less than 70.  Continue rosuvastatin 20 mg daily.  Repeat lipid panel and hepatic function in 6 months.   She believes DEXA scan was completed by OB/GYN, requesting copy of results.  Return in about 3 months (around 06/08/2023) for follow-up for HTN, HLD, DM, POC A1C at visit.    Melida Quitter, PA

## 2023-03-09 NOTE — Assessment & Plan Note (Signed)
BP goal <130/80.  Stable, essentially at goal.  Continue managing with nonpharmacologic therapy.  Will continue to monitor.

## 2023-03-09 NOTE — Assessment & Plan Note (Addendum)
A1c increased from 7.2 to 7.4, barely below goal of less than 7.5.  Discussed working on diet changes and reducing sugar intake as well as pairing any high sugar/carb foods with protein or fiber to decrease blood sugar spikes.  Patient verbalized understanding.  Continue Jardiance 25 mg daily, Ozempic 2 mg weekly.  Repeat point-of-care A1c in 3 months and adjust management as needed at that time.  Foot exam completed today.  Advised her to have eye appointment done and discussed the importance of annual eye exams due to history of hypertension and diabetes.

## 2023-03-09 NOTE — Patient Instructions (Addendum)
Work on cutting back on your sweets and high carb foods and I am hopeful that we can get your A1c back down!  I also recommend that you schedule an appointment with your eye doctor for an annual exam. GOAL: 20-25 g fiber daily  Routine vaccinations are recommended for everyone at certain ages, particularly for those who are at increased risk of illness and complications from having a weak immune system.  For anyone with diabetes and anyone older than 65, both of these create a weaker immune system.  For this reason, it is recommended that you get vaccines to protect you from tetanus, shingles, and pneumonia.

## 2023-03-09 NOTE — Assessment & Plan Note (Signed)
Last lipid panel: LDL 73, HDL 57, triglycerides 176.  LDL nearly at goal of less than 70.  Continue rosuvastatin 20 mg daily.  Repeat lipid panel and hepatic function in 6 months.

## 2023-03-09 NOTE — Assessment & Plan Note (Signed)
Recent TSH within normal limits.  Continue levothyroxine 75 mcg daily.  Will continue to monitor, recheck thyroid labs in 6 months.

## 2023-03-25 ENCOUNTER — Other Ambulatory Visit: Payer: Self-pay | Admitting: Family Medicine

## 2023-03-25 DIAGNOSIS — E118 Type 2 diabetes mellitus with unspecified complications: Secondary | ICD-10-CM

## 2023-04-25 ENCOUNTER — Other Ambulatory Visit: Payer: Self-pay | Admitting: Family Medicine

## 2023-04-25 DIAGNOSIS — F4323 Adjustment disorder with mixed anxiety and depressed mood: Secondary | ICD-10-CM

## 2023-05-07 ENCOUNTER — Other Ambulatory Visit: Payer: Self-pay | Admitting: Family Medicine

## 2023-05-07 DIAGNOSIS — E039 Hypothyroidism, unspecified: Secondary | ICD-10-CM

## 2023-07-04 ENCOUNTER — Other Ambulatory Visit: Payer: Self-pay | Admitting: Family Medicine

## 2023-07-04 DIAGNOSIS — E118 Type 2 diabetes mellitus with unspecified complications: Secondary | ICD-10-CM

## 2023-07-11 ENCOUNTER — Ambulatory Visit (INDEPENDENT_AMBULATORY_CARE_PROVIDER_SITE_OTHER): Payer: Medicare PPO | Admitting: Family Medicine

## 2023-07-11 ENCOUNTER — Encounter: Payer: Self-pay | Admitting: Family Medicine

## 2023-07-11 VITALS — BP 138/71 | HR 90 | Temp 98.0°F | Ht 66.0 in | Wt 139.0 lb

## 2023-07-11 DIAGNOSIS — E118 Type 2 diabetes mellitus with unspecified complications: Secondary | ICD-10-CM

## 2023-07-11 DIAGNOSIS — E039 Hypothyroidism, unspecified: Secondary | ICD-10-CM

## 2023-07-11 DIAGNOSIS — E1159 Type 2 diabetes mellitus with other circulatory complications: Secondary | ICD-10-CM

## 2023-07-11 DIAGNOSIS — Z7985 Long-term (current) use of injectable non-insulin antidiabetic drugs: Secondary | ICD-10-CM

## 2023-07-11 DIAGNOSIS — I152 Hypertension secondary to endocrine disorders: Secondary | ICD-10-CM | POA: Diagnosis not present

## 2023-07-11 DIAGNOSIS — R8290 Unspecified abnormal findings in urine: Secondary | ICD-10-CM

## 2023-07-11 LAB — POCT URINALYSIS DIPSTICK
Bilirubin, UA: NEGATIVE
Blood, UA: NEGATIVE
Glucose, UA: POSITIVE — AB
Leukocytes, UA: NEGATIVE
Nitrite, UA: NEGATIVE
Protein, UA: NEGATIVE
Spec Grav, UA: 1.025 (ref 1.010–1.025)
Urobilinogen, UA: 0.2 U/dL
pH, UA: 5.5 (ref 5.0–8.0)

## 2023-07-11 LAB — POCT GLYCOSYLATED HEMOGLOBIN (HGB A1C): Hemoglobin A1C: 6.8 % — AB (ref 4.0–5.6)

## 2023-07-11 LAB — POCT UA - MICROALBUMIN
Albumin/Creatinine Ratio, Urine, POC: <30
Creatinine, POC: 100 mg/dL
Microalbumin Ur, POC: 30 mg/L

## 2023-07-11 MED ORDER — ROSUVASTATIN CALCIUM 20 MG PO TABS: 20.0000 mg | ORAL_TABLET | Freq: Every day | ORAL | 0 refills | Status: DC

## 2023-07-11 MED ORDER — LEVOTHYROXINE SODIUM 75 MCG PO TABS: 75.0000 ug | ORAL_TABLET | Freq: Every day | ORAL | 0 refills | Status: DC

## 2023-07-11 MED ORDER — FLUCONAZOLE 150 MG PO TABS: 150.0000 mg | ORAL_TABLET | Freq: Once | ORAL | 0 refills | Status: AC

## 2023-07-11 NOTE — Progress Notes (Signed)
Established Patient Office Visit  Subjective   Patient ID: Amanda Douglas, female    DOB: 10/13/1945  Age: 78 y.o. MRN: [id number]  Chief Complaint  Patient presents with   Follow-up    HPI [place] L Douglas is a 78 y.o. female presenting today for follow up of hypertension, diabetes.  She does note that she had a yeast infection with thick white discharge and pruritus a few weeks ago.  She continues to have a strong urine odor that she describes as "yeastlike, as if you are baking bread". Hypertension: Patient here for follow-up of elevated blood pressure. She is exercising and is adherent to low salt diet, currently managing with lifestyle interventions alone.   Pt denies chest pain, SOB, dizziness, edema, syncope, fatigue or heart palpitations.  Diabetes: denies hypoglycemic events, wounds or sores that are not healing well, increased thirst or urination. Denies vision problems, eye exam overdue. Taking Ozempic and empagliflozin as prescribed without any side effects.   Outpatient Medications Prior to Visit  Medication Sig   Cholecalciferol (VITAMIN D3) 5000 units CAPS Take 1 capsule by mouth daily.   citalopram (CELEXA) 20 MG tablet TAKE 1 TABLET BY MOUTH DAILY.   fexofenadine (ALLEGRA) 180 MG tablet Take 1 tablet (180 mg total) by mouth daily.   Lysine 1000 MG TABS Take 1 tablet by mouth daily.   Multiple Vitamin (MULTIVITAMIN) tablet Take 1 tablet by mouth daily.   OZEMPIC, 2 MG/DOSE, 8 MG/3ML SOPN INJECT 2 MG INTO THE SKIN ONCE A WEEK.   sucralfate (CARAFATE) 1 g tablet Take 1 tablet (1 g total) by mouth 3 (three) times daily as needed.   triamcinolone (NASACORT) 55 MCG/ACT AERO nasal inhaler Place 2 sprays into the nose daily.   [DISCONTINUED] empagliflozin (JARDIANCE) 25 MG TABS tablet TAKE 1 TABLET (25 MG TOTAL) BY MOUTH DAILY BEFORE BREAKFAST.   [DISCONTINUED] levothyroxine (SYNTHROID) 75 MCG tablet TAKE 1 TABLET (75 MCG TOTAL) BY MOUTH DAILY BEFORE BREAKFAST.    [DISCONTINUED] rosuvastatin (CRESTOR) 20 MG tablet Take 1 tablet (20 mg total) by mouth at bedtime.   No facility-administered medications prior to visit.    ROS Negative unless otherwise noted in HPI   Objective:     BP 138/71   Pulse 90   Temp 98 F (36.7 C) (Temporal)   Ht 5\' 6"  (1.676 m)   Wt 139 lb (63 kg)   SpO2 99%   BMI 22.44 kg/m   Physical Exam Constitutional:      General: She is not in acute distress.    Appearance: Normal appearance.  HENT:     Head: Normocephalic and atraumatic.  Cardiovascular:     Rate and Rhythm: Normal rate and regular rhythm.     Heart sounds: No murmur heard.    No friction rub. No gallop.  Pulmonary:     Effort: Pulmonary effort is normal. No respiratory distress.     Breath sounds: No wheezing, rhonchi or rales.  Skin:    General: Skin is warm and dry.  Neurological:     Mental Status: She is alert and oriented to person, place, and time.    Results for orders placed or performed in visit on 07/11/23  POCT HgB A1C  Result Value Ref Range   Hemoglobin A1C 6.8 (A) 4.0 - 5.6 %   HbA1c POC (<> result, manual entry)     HbA1c, POC (prediabetic range)     HbA1c, POC (controlled diabetic range)    POCT urinalysis dipstick  Result Value Ref Range   Color, UA yellow    Clarity, UA clear    Glucose, UA Positive (A) Negative   Bilirubin, UA negative    Ketones, UA 40mg     Spec Grav, UA 1.025 1.010 - 1.025   Blood, UA negative    pH, UA 5.5 5.0 - 8.0   Protein, UA Negative Negative   Urobilinogen, UA 0.2 0.2 or 1.0 E.U./dL   Nitrite, UA negative    Leukocytes, UA Negative Negative   Appearance     Odor    POCT UA - Microalbumin  Result Value Ref Range   Microalbumin Ur, POC 30 mg/L   Creatinine, POC 100 mg/dL   Albumin/Creatinine Ratio, Urine, POC <30      Assessment & Plan:  Hypertension associated with diabetes (HCC) Assessment & Plan: BP goal <130/80.  Stable, essentially at goal.  Continue managing with  nonpharmacologic therapy.  Will continue to monitor.  Orders: -     Rosuvastatin Calcium; Take 1 tablet (20 mg total) by mouth at bedtime.  Dispense: 90 tablet; Refill: 0  Type 2 diabetes mellitus with complication (HCC) Assessment & Plan: Goal A1C <7.5.  At last appointment A1c was 7.4, today is.  Continue Ozempic 2 mg weekly.  Given excellent glycemic control and possibility of Jardiance causing side effects, recommend to discontinue use of Jardiance.  Foot exam up-to-date, urine microalbumin within normal limits.  Discussed recommendations for annual eye exam, patient verbalized understanding.  Orders: -     POCT glycosylated hemoglobin (Hb A1C) -     POCT UA - Microalbumin  Abnormal urine odor -     POCT urinalysis dipstick  Hypothyroidism, unspecified type -     Levothyroxine Sodium; Take 1 tablet (75 mcg total) by mouth daily before breakfast.  Dispense: 90 tablet; Refill: 0  Other orders -     Fluconazole; Take 1 tablet (150 mg total) by mouth once for 1 dose.  Dispense: 1 tablet; Refill: 0  Urinalysis negative for bacterial UTI.  Positive for glucose which is to be expected given that patient is currently taking empagliflozin.  Given symptoms of vulvovaginal candidiasis within the past few weeks in addition to the abnormal urine odor, will treat Candida infection with single dose of fluconazole.  Return in about 3 months (around 10/09/2023) for follow-up for HTN, HLD, DM, thyroid, fasting labs 1 week before.    Amanda Quitter, PA

## 2023-07-11 NOTE — Patient Instructions (Signed)
PREVENTATIVE CARE: I encourage you to consider the following preventative care options that are recommended for you.  These are covered by your insurance company because they are for the prevention of issues that can be dangerous to your health. -Eye exam: Recommended to have annual eye exam for everyone, but especially those with certain conditions that increase the risk of eye damage like diabetes and high blood pressure. -Shingles vaccine: 2 dose series recommended for all adults starting at age 86. -Tetanus (Tdap) booster: Recommended once every 10 years for all adults 72 and older.  As we discussed, Medicare requires that you get your tetanus booster or shingles shot at the pharmacy if you decide to move forward with either.

## 2023-07-11 NOTE — Assessment & Plan Note (Addendum)
Goal A1C <7.5.  At last appointment A1c was 7.4, today is.  Continue Ozempic 2 mg weekly.  Given excellent glycemic control and possibility of Jardiance causing side effects, recommend to discontinue use of Jardiance.  Foot exam up-to-date, urine microalbumin within normal limits.  Discussed recommendations for annual eye exam, patient verbalized understanding.

## 2023-07-11 NOTE — Assessment & Plan Note (Signed)
BP goal <130/80.  Stable, essentially at goal.  Continue managing with nonpharmacologic therapy.  Will continue to monitor.

## 2023-07-21 ENCOUNTER — Encounter: Payer: Self-pay | Admitting: Family Medicine

## 2023-07-22 ENCOUNTER — Encounter: Payer: Self-pay | Admitting: Family Medicine

## 2023-09-23 ENCOUNTER — Other Ambulatory Visit: Payer: Self-pay | Admitting: Family Medicine

## 2023-09-23 DIAGNOSIS — E118 Type 2 diabetes mellitus with unspecified complications: Secondary | ICD-10-CM

## 2023-10-11 ENCOUNTER — Other Ambulatory Visit: Payer: Self-pay | Admitting: Family Medicine

## 2023-10-11 DIAGNOSIS — E1159 Type 2 diabetes mellitus with other circulatory complications: Secondary | ICD-10-CM

## 2023-10-26 ENCOUNTER — Other Ambulatory Visit: Payer: Self-pay | Admitting: Family Medicine

## 2023-10-26 DIAGNOSIS — F4323 Adjustment disorder with mixed anxiety and depressed mood: Secondary | ICD-10-CM

## 2023-11-01 ENCOUNTER — Other Ambulatory Visit: Payer: Self-pay | Admitting: *Deleted

## 2023-11-01 DIAGNOSIS — E039 Hypothyroidism, unspecified: Secondary | ICD-10-CM

## 2023-11-01 DIAGNOSIS — E1159 Type 2 diabetes mellitus with other circulatory complications: Secondary | ICD-10-CM

## 2023-11-01 DIAGNOSIS — E1169 Type 2 diabetes mellitus with other specified complication: Secondary | ICD-10-CM

## 2023-11-02 ENCOUNTER — Other Ambulatory Visit: Payer: Medicare PPO

## 2023-11-02 DIAGNOSIS — E039 Hypothyroidism, unspecified: Secondary | ICD-10-CM

## 2023-11-02 DIAGNOSIS — E1169 Type 2 diabetes mellitus with other specified complication: Secondary | ICD-10-CM

## 2023-11-02 DIAGNOSIS — E1159 Type 2 diabetes mellitus with other circulatory complications: Secondary | ICD-10-CM

## 2023-11-03 ENCOUNTER — Ambulatory Visit: Payer: Self-pay | Admitting: Family Medicine

## 2023-11-03 LAB — CBC WITH DIFFERENTIAL/PLATELET
Basophils Absolute: 0.1 10*3/uL (ref 0.0–0.2)
Basos: 1 %
EOS (ABSOLUTE): 0 10*3/uL (ref 0.0–0.4)
Eos: 0 %
Hematocrit: 44.3 % (ref 34.0–46.6)
Hemoglobin: 14.5 g/dL (ref 11.1–15.9)
Immature Grans (Abs): 0 10*3/uL (ref 0.0–0.1)
Immature Granulocytes: 0 %
Lymphocytes Absolute: 1.7 10*3/uL (ref 0.7–3.1)
Lymphs: 22 %
MCH: 30.4 pg (ref 26.6–33.0)
MCHC: 32.7 g/dL (ref 31.5–35.7)
MCV: 93 fL (ref 79–97)
Monocytes Absolute: 0.6 10*3/uL (ref 0.1–0.9)
Monocytes: 8 %
Neutrophils Absolute: 5.5 10*3/uL (ref 1.4–7.0)
Neutrophils: 69 %
Platelets: 237 10*3/uL (ref 150–450)
RBC: 4.77 x10E6/uL (ref 3.77–5.28)
RDW: 13.2 % (ref 11.7–15.4)
WBC: 7.9 10*3/uL (ref 3.4–10.8)

## 2023-11-03 LAB — COMPREHENSIVE METABOLIC PANEL WITH GFR
ALT: 29 IU/L (ref 0–32)
AST: 21 IU/L (ref 0–40)
Albumin: 4.5 g/dL (ref 3.8–4.8)
Alkaline Phosphatase: 94 IU/L (ref 44–121)
BUN/Creatinine Ratio: 19 (ref 12–28)
BUN: 13 mg/dL (ref 8–27)
Bilirubin Total: 0.9 mg/dL (ref 0.0–1.2)
CO2: 25 mmol/L (ref 20–29)
Calcium: 9.5 mg/dL (ref 8.7–10.3)
Chloride: 101 mmol/L (ref 96–106)
Creatinine, Ser: 0.68 mg/dL (ref 0.57–1.00)
Globulin, Total: 2.1 g/dL (ref 1.5–4.5)
Glucose: 138 mg/dL — ABNORMAL HIGH (ref 70–99)
Potassium: 4.5 mmol/L (ref 3.5–5.2)
Sodium: 139 mmol/L (ref 134–144)
Total Protein: 6.6 g/dL (ref 6.0–8.5)
eGFR: 89 mL/min/{1.73_m2} (ref >59)

## 2023-11-03 LAB — LIPID PANEL
Chol/HDL Ratio: 2.7 ratio (ref 0.0–4.4)
Cholesterol, Total: 161 mg/dL (ref 100–199)
HDL: 60 mg/dL (ref >39)
LDL Chol Calc (NIH): 74 mg/dL (ref 0–99)
Triglycerides: 157 mg/dL — ABNORMAL HIGH (ref 0–149)
VLDL Cholesterol Cal: 27 mg/dL (ref 5–40)

## 2023-11-03 LAB — HEMOGLOBIN A1C
Est. average glucose Bld gHb Est-mCnc: 160 mg/dL
Hgb A1c MFr Bld: 7.2 % — ABNORMAL HIGH (ref 4.8–5.6)

## 2023-11-03 LAB — TSH: TSH: 5.25 u[IU]/mL — ABNORMAL HIGH (ref 0.450–4.500)

## 2023-11-09 ENCOUNTER — Ambulatory Visit: Payer: Medicare PPO | Admitting: Family Medicine

## 2023-11-30 ENCOUNTER — Ambulatory Visit (INDEPENDENT_AMBULATORY_CARE_PROVIDER_SITE_OTHER)

## 2023-11-30 VITALS — BP 116/72 | HR 84 | Ht 66.0 in | Wt 150.0 lb

## 2023-11-30 DIAGNOSIS — E1159 Type 2 diabetes mellitus with other circulatory complications: Secondary | ICD-10-CM | POA: Diagnosis not present

## 2023-11-30 DIAGNOSIS — Z7985 Long-term (current) use of injectable non-insulin antidiabetic drugs: Secondary | ICD-10-CM

## 2023-11-30 DIAGNOSIS — E1169 Type 2 diabetes mellitus with other specified complication: Secondary | ICD-10-CM | POA: Diagnosis not present

## 2023-11-30 DIAGNOSIS — I152 Hypertension secondary to endocrine disorders: Secondary | ICD-10-CM

## 2023-11-30 DIAGNOSIS — E118 Type 2 diabetes mellitus with unspecified complications: Secondary | ICD-10-CM | POA: Diagnosis not present

## 2023-11-30 DIAGNOSIS — E039 Hypothyroidism, unspecified: Secondary | ICD-10-CM

## 2023-11-30 DIAGNOSIS — E785 Hyperlipidemia, unspecified: Secondary | ICD-10-CM

## 2023-11-30 MED ORDER — LEVOTHYROXINE SODIUM 88 MCG PO TABS: 88.0000 ug | ORAL_TABLET | Freq: Every day | ORAL | 3 refills | Status: DC

## 2023-11-30 NOTE — Assessment & Plan Note (Signed)
 A1c goal less than 7.5.  A1c 7.2 today which is slightly increased from the last check.  Continue Ozempic  2 mg weekly advised patient to be vigilant of foods high in simple sugars and carbohydrates.  Will continue to monitor.

## 2023-11-30 NOTE — Patient Instructions (Signed)
 It was nice to see you today!  As we discussed in clinic:  -I have increased your dose of levothyroxine  from 75 mcg to 88 mcg. We will plan to recheck TSH in 8 weeks to make sure your thyroid  is functioning normally.  -Continue all of your current medications as prescribed.   I will plan to see you back in 8 weeks for follow up on thyroid . It was so nice to meet you!  If you have any problems before your next visit feel free to message me via MyChart (minor issues or questions) or call the office, otherwise you may reach out to schedule an office visit.  Thank you! Meryl Acosta, PA-C

## 2023-11-30 NOTE — Assessment & Plan Note (Signed)
 Most recent TSH elevated at 5.2.  Given that patient is symptomatic with fatigue, weight gain, constipation, will increase dose of levothyroxine  from 75 mcg to 88 mcg daily.  Will recheck TSH in 8 weeks.

## 2023-11-30 NOTE — Assessment & Plan Note (Signed)
 Last lipid panel: LDL 74, HDL 60, triglycerides 157.  LDL nearly at goal of less than 70.  Continue rosuvastatin  20 mg daily.  CMP within normal limits.  Will recheck in 6 months.

## 2023-11-30 NOTE — Assessment & Plan Note (Signed)
 BP goal <130/80.  Stable and at goal.  Continue managing with nonpharmacologic therapy.  Will continue to monitor.

## 2023-11-30 NOTE — Progress Notes (Signed)
 Established Patient Office Visit  Subjective   Patient ID: Amanda  Issac Douglas, female    DOB: 03/09/1946  Age: 78 y.o. MRN: [id number]  Chief Complaint  Patient presents with   Hyperlipidemia   Hypertension   Diabetes    HPI  Amanda  L Douglas is a 78 y.o. female who presents to the clinic today for follow-up on hypertension, hyperlipidemia, diabetes, thyroid .  Hypertension: Patient currently managing blood pressure with nonpharmacologic therapy. Denies CP, SOB, Palpitations, vision changes, HA, or edema.  Hyperlipidemia: Patient currently taking rosuvastatin  20 mg daily for their cholesterol. Reports excellent compliance with this medication. Denies side effects/new myalgias.   Diabetes: Patient currently takes Ozempic  2 mg weekly for management of their diabetes. Reports that they are taking this medication as prescribed. They don't check BG at home, but do have supplies to do so. Denies side effects. Denies episodes of hypoglycemia. Denies new sores/wounds that are not healing.    Thyroid : Patient currently taking 75 mcg levothyroxine  daily.  Reports excellent compliance with this medication.  Reports that she is taking it in the mornings away from other food and medications.  Reports that over the last two months, she has felt more fatigued and constipated. Also reports she has gained about 10 lbs over the last few months,    ROS Per HPI.    Objective:     BP 116/72   Pulse 84   Ht 5' 6 (1.676 m)   Wt 150 lb (68 kg)   SpO2 98%   BMI 24.21 kg/m    Physical Exam Constitutional:      General: She is not in acute distress.    Appearance: Normal appearance.   Cardiovascular:     Rate and Rhythm: Normal rate and regular rhythm.     Heart sounds: Normal heart sounds. No murmur heard.    No friction rub. No gallop.  Pulmonary:     Effort: Pulmonary effort is normal. No respiratory distress.     Breath sounds: Normal breath sounds.   Musculoskeletal:        General:  No swelling.     Cervical back: Neck supple.   Skin:    General: Skin is warm and dry.   Neurological:     General: No focal deficit present.     Mental Status: She is alert.   Psychiatric:        Mood and Affect: Mood normal.        Behavior: Behavior normal.        Thought Content: Thought content normal.     No results found for any visits on 11/30/23.  Last CBC Lab Results  Component Value Date   WBC 7.9 11/02/2023   HGB 14.5 11/02/2023   HCT 44.3 11/02/2023   MCV 93 11/02/2023   MCH 30.4 11/02/2023   RDW 13.2 11/02/2023   PLT 237 11/02/2023   Last metabolic panel Lab Results  Component Value Date   GLUCOSE 138 (H) 11/02/2023   NA 139 11/02/2023   K 4.5 11/02/2023   CL 101 11/02/2023   CO2 25 11/02/2023   BUN 13 11/02/2023   CREATININE 0.68 11/02/2023   EGFR 89 11/02/2023   CALCIUM  9.5 11/02/2023   PROT 6.6 11/02/2023   ALBUMIN 4.5 11/02/2023   LABGLOB 2.1 11/02/2023   AGRATIO 2.2 11/04/2022   BILITOT 0.9 11/02/2023   ALKPHOS 94 11/02/2023   AST 21 11/02/2023   ALT 29 11/02/2023   Last lipids Lab Results  Component  Value Date   CHOL 161 11/02/2023   HDL 60 11/02/2023   LDLCALC 74 11/02/2023   TRIG 157 (H) 11/02/2023   CHOLHDL 2.7 11/02/2023   Last hemoglobin A1c Lab Results  Component Value Date   HGBA1C 7.2 (H) 11/02/2023   Last thyroid  functions Lab Results  Component Value Date   TSH 5.250 (H) 11/02/2023   T3TOTAL 72 12/05/2020   T4TOTAL 10.9 03/02/2023      The 10-year ASCVD risk score (Arnett DK, et al., 2019) is: 36.5%    Assessment & Plan:   Hypertension associated with diabetes (HCC) Assessment & Plan: BP goal <130/80.  Stable and at goal.  Continue managing with nonpharmacologic therapy.  Will continue to monitor.   Hypothyroidism, unspecified type Assessment & Plan: Most recent TSH elevated at 5.2.  Given that patient is symptomatic with fatigue, weight gain, constipation, will increase dose of levothyroxine  from 75  mcg to 88 mcg daily.  Will recheck TSH in 8 weeks.   Hyperlipidemia associated with type 2 diabetes mellitus (HCC) Assessment & Plan: Last lipid panel: LDL 74, HDL 60, triglycerides 157.  LDL nearly at goal of less than 70.  Continue rosuvastatin  20 mg daily.  CMP within normal limits.  Will recheck in 6 months.   Type 2 diabetes mellitus with complication (HCC) Assessment & Plan: A1c goal less than 7.5.  A1c 7.2 today which is slightly increased from the last check.  Continue Ozempic  2 mg weekly advised patient to be vigilant of foods high in simple sugars and carbohydrates.  Will continue to monitor.   Other orders -     Levothyroxine  Sodium; Take 1 tablet (88 mcg total) by mouth daily.  Dispense: 90 tablet; Refill: 3   Return in about 8 weeks (around 01/25/2024) for thyroid  check.    Amanda Bennett, PA-C

## 2023-12-14 ENCOUNTER — Ambulatory Visit

## 2023-12-14 VITALS — Ht 66.0 in | Wt 150.0 lb

## 2023-12-14 DIAGNOSIS — Z0000 Encounter for general adult medical examination without abnormal findings: Secondary | ICD-10-CM

## 2023-12-14 NOTE — Progress Notes (Signed)
 Subjective:   Amanda Douglas is a 78 y.o. who presents for a Medicare Wellness preventive visit.  As a reminder, Annual Wellness Visits don't include a physical exam, and some assessments may be limited, especially if this visit is performed virtually. We may recommend an in-person follow-up visit with your provider if needed.  Visit Complete: Virtual I connected with  Amanda Douglas on 12/14/23 by a audio enabled telemedicine application and verified that I am speaking with the correct person using two identifiers.  Patient Location: Home  Provider Location: Home Office  I discussed the limitations of evaluation and management by telemedicine. The patient expressed understanding and agreed to proceed.  Vital Signs: Because this visit was a virtual/telehealth visit, some criteria may be missing or patient reported. Any vitals not documented were not able to be obtained and vitals that have been documented are patient reported.  VideoDeclined- This patient declined Librarian, academic. Therefore the visit was completed with audio only.  Persons Participating in Visit: Patient.  AWV Questionnaire: No: Patient Medicare AWV questionnaire was not completed prior to this visit.  Cardiac Risk Factors include: advanced age (>5men, >61 women);diabetes mellitus;hypertension     Objective:    Today's Vitals   12/14/23 1059  Weight: 150 lb (68 kg)  Height: 5' 6 (1.676 m)   Body mass index is 24.21 kg/m.     12/14/2023   11:04 AM 11/11/2022    4:04 PM 03/03/2017   10:40 AM 04/19/2016   11:19 AM  Advanced Directives  Does Patient Have a Medical Advance Directive? No No No  No   Would patient like information on creating a medical advance directive? Yes (MAU/Ambulatory/Procedural Areas - Information given) No - Patient declined No - Patient declined  No - patient declined information      Data saved with a previous flowsheet row definition    Current  Medications (verified) Outpatient Encounter Medications as of 12/14/2023  Medication Sig   Cholecalciferol (VITAMIN D3) 5000 units CAPS Take 1 capsule by mouth daily.   citalopram  (CELEXA ) 20 MG tablet TAKE 1 TABLET BY MOUTH DAILY.   fexofenadine  (ALLEGRA ) 180 MG tablet Take 1 tablet (180 mg total) by mouth daily.   levothyroxine  (SYNTHROID ) 88 MCG tablet Take 1 tablet (88 mcg total) by mouth daily.   Lysine 1000 MG TABS Take 1 tablet by mouth daily.   Multiple Vitamin (MULTIVITAMIN) tablet Take 1 tablet by mouth daily.   OZEMPIC , 2 MG/DOSE, 8 MG/3ML SOPN INJECT 2 MG INTO THE SKIN ONCE A WEEK.   rosuvastatin  (CRESTOR ) 20 MG tablet TAKE 1 TABLET (20 MG TOTAL) BY MOUTH AT BEDTIME.   sucralfate  (CARAFATE ) 1 g tablet Take 1 tablet (1 g total) by mouth 3 (three) times daily as needed.   triamcinolone (NASACORT) 55 MCG/ACT AERO nasal inhaler Place 2 sprays into the nose daily.   No facility-administered encounter medications on file as of 12/14/2023.    Allergies (verified) Codeine and Metformin  and related   History: Past Medical History:  Diagnosis Date   Basal cell carcinoma (BCC)- chin in 2012 03/03/2017   - Patient notified me of her history of basal cell carcinoma today for the first time.     - Used to see Santana Amis, MD- dermatologist in [facility] but she is no longer practicing.              Depression    Diabetes mellitus without complication (HCC)    Hyperlipidemia  Thyroid  disease    Past Surgical History:  Procedure Laterality Date   ANKLE FRACTURE SURGERY     CESAREAN SECTION     Family History  Problem Relation Age of Onset   Diabetes Mother    Hypertension Mother    Thyroid  disease Mother    Depression Mother    COPD Sister    Arthritis Sister    Healthy Brother    Healthy Daughter    Diabetes Son    Cancer Sister        squamous cell   Hypertension Son    Social History   Socioeconomic History   Marital status: Married    Spouse name: Not on file    Number of children: Not on file   Years of education: Not on file   Highest education level: Not on file  Occupational History   Not on file  Tobacco Use   Smoking status: Former    Current packs/day: 0.00    Average packs/day: 1 pack/day for 15.0 years (15.0 ttl pk-yrs)    Types: Cigarettes    Start date: 06/14/1962    Quit date: 06/14/1977    Years since quitting: 46.5    Passive exposure: Past   Smokeless tobacco: Never  Vaping Use   Vaping status: Never Used  Substance and Sexual Activity   Alcohol use: No   Drug use: No   Sexual activity: Never  Other Topics Concern   Not on file  Social History Narrative   Not on file   Social Drivers of Health   Financial Resource Strain: Low Risk  (12/14/2023)   Overall Financial Resource Strain (CARDIA)    Difficulty of Paying Living Expenses: Not hard at all  Food Insecurity: No Food Insecurity (12/14/2023)   Hunger Vital Sign    Worried About Running Out of Food in the Last Year: Never true    Ran Out of Food in the Last Year: Never true  Transportation Needs: No Transportation Needs (12/14/2023)   PRAPARE - Administrator, Civil Service (Medical): No    Lack of Transportation (Non-Medical): No  Physical Activity: Inactive (12/14/2023)   Exercise Vital Sign    Days of Exercise per Week: 0 days    Minutes of Exercise per Session: 0 min  Stress: No Stress Concern Present (12/14/2023)   [employer] of Occupational Health - Occupational Stress Questionnaire    Feeling of Stress: Not at all  Social Connections: Socially Integrated (12/14/2023)   Social Connection and Isolation Panel    Frequency of Communication with Friends and Family: More than three times a week    Frequency of Social Gatherings with Friends and Family: More than three times a week    Attends Religious Services: More than 4 times per year    Active Member of [employer] or Organizations: Yes    Attends Engineer, structural: More than 4 times per year     Marital Status: Married    Tobacco Counseling Counseling given: Not Answered    Clinical Intake:  Pre-visit preparation completed: Yes  Pain : No/denies pain     Diabetes: No  Lab Results  Component Value Date   HGBA1C 7.2 (H) 11/02/2023   HGBA1C 6.8 (A) 07/11/2023   HGBA1C 7.4 (H) 03/02/2023     How often do you need to have someone help you when you read instructions, pamphlets, or other written materials from your doctor or pharmacy?: 1 - Never  Interpreter Needed?: No  Information entered by :: Charmaine Bloodgood LPN   Activities of Daily Living     12/14/2023   11:04 AM  In your present state of health, do you have any difficulty performing the following activities:  Hearing? 0  Vision? 0  Difficulty concentrating or making decisions? 0  Walking or climbing stairs? 0  Dressing or bathing? 0  Doing errands, shopping? 0  Preparing Food and eating ? N  Using the Toilet? N  In the past six months, have you accidently leaked urine? N  Do you have problems with loss of bowel control? N  Managing your Medications? N  Managing your Finances? N  Housekeeping or managing your Housekeeping? N    Patient Care Team: Gayle Saddie JULIANNA DEVONNA as PCP - General (Physician Assistant) Murriel Barb, MD as Referring Physician (Internal Medicine) G[facility], [place] as Consulting Physician (Gastroenterology) Kassie Mallick, MD (Inactive) as Consulting Physician (Endocrinology) Curlene Agent, MD as Consulting Physician (Obstetrics and Gynecology)  I have updated your Care Teams any recent Medical Services you may have received from other providers in the past year.     Assessment:   This is a routine wellness examination for Amanda Douglas .  Hearing/Vision screen Hearing Screening - Comments:: Denies hearing difficulties   Vision Screening - Comments:: No vision problems; will schedule routine eye exam     Goals Addressed               This Visit's Progress      Remain active and independent (pt-stated)   On track      Depression Screen     12/14/2023   11:03 AM 11/30/2023    3:07 PM 07/11/2023    1:29 PM 11/11/2022    4:01 PM 07/13/2022    3:22 PM 03/01/2022    3:56 PM 12/28/2021    3:50 PM  PHQ 2/9 Scores  PHQ - 2 Score 0 0 0 0 0 0 0  PHQ- 9 Score 2 2 1   0 2 0    Fall Risk     12/14/2023   11:04 AM 11/30/2023    3:06 PM 07/11/2023    1:29 PM 11/11/2022    4:03 PM 07/13/2022    3:23 PM  Fall Risk   Falls in the past year? 0 0 0 0 1  Number falls in past yr: 0  0 0 0  Injury with Fall? 0  0 0 0  Risk for fall due to : No Fall Risks No Fall Risks No Fall Risks No Fall Risks   Follow up Falls prevention discussed;Education provided;Falls evaluation completed Falls evaluation completed Falls evaluation completed Falls prevention discussed Falls evaluation completed    MEDICARE RISK AT HOME:  Medicare Risk at Home Any stairs in or around the home?: No If so, are there any without handrails?: No Home free of loose throw rugs in walkways, pet beds, electrical cords, etc?: Yes Adequate lighting in your home to reduce risk of falls?: Yes Life alert?: No Use of a cane, walker or w/c?: No Grab bars in the bathroom?: Yes Shower chair or bench in shower?: No Elevated toilet seat or a handicapped toilet?: Yes  TIMED UP AND GO:  Was the test performed?  No  Cognitive Function: 6CIT completed    05/01/2019   11:33 AM  MMSE - Mini Mental State Exam  Orientation to time 5  Orientation to Place 5  Registration 3  Attention/ Calculation 5  Recall 3  Language- name 2 objects  2  Language- repeat 1  Language- follow 3 step command --  Language- follow 3 step command-comments Virtual appointment  Language- read & follow direction --  Language-read & follow direction-comments virtual appointment  Write a sentence --  Write a sentence-comments virtual appointment  Copy design --  Copy design-comments virtual appointment        12/14/2023    11:04 AM 11/11/2022    4:04 PM 10/28/2021    9:28 AM 10/22/2020   10:39 AM 05/01/2019   11:28 AM  6CIT Screen  What Year? 0 points 0 points 0 points 0 points 0 points  What month? 0 points 0 points 0 points 0 points 0 points  What time? 0 points 0 points 0 points 0 points 0 points  Count back from 20 0 points 0 points 0 points 2 points 0 points  Months in reverse 0 points 0 points 0 points 0 points 0 points  Repeat phrase 0 points 0 points 0 points 0 points 4 points  Total Score 0 points 0 points 0 points 2 points 4 points    Immunizations There is no immunization history for the selected administration types on file for this patient.  Screening Tests Health Maintenance  Topic Date Due   OPHTHALMOLOGY EXAM  04/11/2021   COVID-19 Vaccine (1 - 2024-25 season) 12/16/2023 (Originally 02/13/2023)   Zoster Vaccines- Shingrix (1 of 2) 03/01/2024 (Originally 07/10/1994)   Pneumococcal Vaccine: 50+ Years (1 of 2 - PCV) 03/08/2024 (Originally [birth year]11/1964)   DTaP/Tdap/Td (1 - Tdap) 11/29/2024 (Originally 07[birth year]11/1963)   INFLUENZA VACCINE  01/13/2024   FOOT EXAM  03/08/2024   HEMOGLOBIN A1C  05/04/2024   Diabetic kidney evaluation - Urine ACR  07/10/2024   Diabetic kidney evaluation - eGFR measurement  11/01/2024   Medicare Annual Wellness (AWV)  12/13/2024   DEXA SCAN  Completed   Hepatitis C Screening  Completed   Hepatitis B Vaccines  Aged Out   HPV VACCINES  Aged Out   Meningococcal B Vaccine  Aged Out   Colonoscopy  Discontinued    Health Maintenance  Health Maintenance Due  Topic Date Due   OPHTHALMOLOGY EXAM  04/11/2021   Health Maintenance Items Addressed: Patient declines vaccines; would like to postpone dexa   Additional Screening:  Vision Screening: Recommended annual ophthalmology exams for early detection of glaucoma and other disorders of the eye. Would you like a referral to an eye doctor? No    Dental Screening: Recommended annual dental exams for proper oral  hygiene  Community Resource Referral / Chronic Care Management: CRR required this visit?  No   CCM required this visit?  No   Plan:    I have personally reviewed and noted the following in the patient's chart:   Medical and social history Use of alcohol, tobacco or illicit drugs  Current medications and supplements including opioid prescriptions. Patient is not currently taking opioid prescriptions. Functional ability and status Nutritional status Physical activity Advanced directives List of other physicians Hospitalizations, surgeries, and ER visits in previous 12 months Vitals Screenings to include cognitive, depression, and falls Referrals and appointments  In addition, I have reviewed and discussed with patient certain preventive protocols, quality metrics, and best practice recommendations. A written personalized care plan for preventive services as well as general preventive health recommendations were provided to patient.   Amanda Douglas [place], [place]   07[birth year]   After Visit Summary: (Pick Up) Due to this being a telephonic visit, with patients personalized plan was offered  to patient and patient has requested to Pick up at office.  Notes: Nothing significant to report at this time.

## 2023-12-14 NOTE — Patient Instructions (Signed)
 [name] , Thank you for taking time out of your busy schedule to complete your Annual Wellness Visit with me. I enjoyed our conversation and look forward to speaking with you again next year. I, as well as your care team,  appreciate your ongoing commitment to your health goals. Please review the following plan we discussed and let me know if I can assist you in the future. Your Game plan/ To Do List    Follow up Visits: Next Medicare AWV with our clinical staff: In 1 year  Have you seen your provider in the last 6 months (3 months if uncontrolled diabetes)? Yes Next Office Visit with your provider: 01/30/24 @ 3:10  Clinician Recommendations:  Aim for 30 minutes of exercise or brisk walking, 6-8 glasses of water, and 5 servings of fruits and vegetables each day.       This is a list of the screening recommended for you and due dates:  Health Maintenance  Topic Date Due   Eye exam for diabetics  04/11/2021   COVID-19 Vaccine (1 - 2024-25 season) 12/16/2023*   Zoster (Shingles) Vaccine (1 of 2) 03/01/2024*   Pneumococcal Vaccine for age over 108 (1 of 2 - PCV) 03/08/2024*   DTaP/Tdap/Td vaccine (1 - Tdap) 11/29/2024*   Flu Shot  01/13/2024   Complete foot exam   03/08/2024   Hemoglobin A1C  05/04/2024   Yearly kidney health urinalysis for diabetes  07/10/2024   Yearly kidney function blood test for diabetes  11/01/2024   Medicare Annual Wellness Visit  12/13/2024   DEXA scan (bone density measurement)  Completed   Hepatitis C Screening  Completed   Hepatitis B Vaccine  Aged Out   HPV Vaccine  Aged Out   Meningitis B Vaccine  Aged Out   Colon Cancer Screening  Discontinued  *Topic was postponed. The date shown is not the original due date.    Advanced directives: (ACP Link)Information on Advanced Care Planning can be found at [place]  Secretary of [facility] Advance Health Care Directives Advance Health Care Directives. [email or web address]   Advance Care Planning is important because  it:  [x]  Makes sure you receive the medical care that is consistent with your values, goals, and preferences  [x]  It provides guidance to your family and loved ones and reduces their decisional burden about whether or not they are making the right decisions based on your wishes.  Follow the link provided in your after visit summary or read over the paperwork we have mailed to you to help you started getting your Advance Directives in place. If you need assistance in completing these, please reach out to us  so that we can help you!  See attachments for Preventive Care and Fall Prevention Tips.

## 2024-01-10 ENCOUNTER — Other Ambulatory Visit: Payer: Self-pay | Admitting: Family Medicine

## 2024-01-10 DIAGNOSIS — E118 Type 2 diabetes mellitus with unspecified complications: Secondary | ICD-10-CM

## 2024-01-22 ENCOUNTER — Other Ambulatory Visit: Payer: Self-pay

## 2024-01-22 DIAGNOSIS — E039 Hypothyroidism, unspecified: Secondary | ICD-10-CM

## 2024-01-23 ENCOUNTER — Other Ambulatory Visit

## 2024-01-23 DIAGNOSIS — E039 Hypothyroidism, unspecified: Secondary | ICD-10-CM

## 2024-01-24 ENCOUNTER — Ambulatory Visit: Payer: Self-pay

## 2024-01-24 LAB — T4, FREE: Free T4: 1.83 ng/dL — ABNORMAL HIGH (ref 0.82–1.77)

## 2024-01-24 LAB — TSH: TSH: 0.144 u[IU]/mL — ABNORMAL LOW (ref 0.450–4.500)

## 2024-01-24 MED ORDER — LEVOTHYROXINE SODIUM 75 MCG PO TABS: 75.0000 ug | ORAL_TABLET | Freq: Every day | ORAL | 3 refills | Status: DC

## 2024-01-24 NOTE — Addendum Note (Signed)
 Addended byBETHA GAYLE NUMBERS on: 01/24/2024 10:42 AM   Modules accepted: Orders

## 2024-01-24 NOTE — Telephone Encounter (Signed)
 Spoke with patient/ related message.

## 2024-01-30 ENCOUNTER — Ambulatory Visit

## 2024-01-30 VITALS — BP 143/76 | HR 94 | Temp 97.3°F | Ht 66.0 in | Wt 149.0 lb

## 2024-01-30 DIAGNOSIS — R300 Dysuria: Secondary | ICD-10-CM | POA: Insufficient documentation

## 2024-01-30 DIAGNOSIS — B3731 Acute candidiasis of vulva and vagina: Secondary | ICD-10-CM | POA: Insufficient documentation

## 2024-01-30 DIAGNOSIS — E039 Hypothyroidism, unspecified: Secondary | ICD-10-CM | POA: Diagnosis not present

## 2024-01-30 DIAGNOSIS — L84 Corns and callosities: Secondary | ICD-10-CM | POA: Insufficient documentation

## 2024-01-30 LAB — POCT URINALYSIS DIPSTICK
Bilirubin, UA: NEGATIVE
Blood, UA: NEGATIVE
Glucose, UA: NEGATIVE
Ketones, UA: NEGATIVE
Nitrite, UA: NEGATIVE
Protein, UA: NEGATIVE
Spec Grav, UA: 1.02 (ref 1.010–1.025)
Urobilinogen, UA: 0.2 U/dL
pH, UA: 5.5 (ref 5.0–8.0)

## 2024-01-30 MED ORDER — CEPHALEXIN 500 MG PO CAPS: 500.0000 mg | ORAL_CAPSULE | Freq: Three times a day (TID) | ORAL | 0 refills | Status: DC

## 2024-01-30 MED ORDER — MICONAZOLE NITRATE 2 % EX CREA: 1.0000 | TOPICAL_CREAM | Freq: Two times a day (BID) | CUTANEOUS | 0 refills | Status: AC

## 2024-01-30 NOTE — Progress Notes (Signed)
 Established Patient Office Visit  Subjective   Patient ID: Amanda Douglas, female    DOB: 07-May-1946  Age: 78 y.o. MRN: [id number]  Chief Complaint  Patient presents with   Medical Management of Chronic Issues    HPI  Discussed the use of AI scribe software for clinical note transcription with the patient, who gave verbal consent to proceed.  History of Present Illness   Amanda Douglas is a 78 year old female with hypothyroidism who presents for follow-up on lab results and thyroid  medication adjustment.  Thyroid  dysfunction - Elevated TSH on previous testing, consistent with hypothyroidism - Last week, we decreased her levothyroxine  from 88 mcg daily to 75 mcg daily due to decreased TSH - Patient denies being symptomatic  Foot lesion - Corn present on toe for an extended period - Uses callus removers for management - Diabetes increases risk for foot complications; exercises caution with foot care  Urinary symptoms - Persistent malodorous urine described as 'yeasty smell' - History of Jardiance  use, but has discontinued the medication; odor persists - Prone to yeast infections; previously used prescription cream with good effect - Intermittent dysuria (burning with urination) - Concern for possible urinary tract infection - No fever, abdominal pain, suprapubic pain, or pressure        ROS Per HPI.    Objective:     BP (!) 143/76 (BP Location: Right Arm, Patient Position: Sitting)   Pulse 94   Temp (!) 97.3 F (36.3 C) (Oral)   Ht 5' 6 (1.676 m)   Wt 149 lb 0.6 oz (67.6 kg)   SpO2 97%   BMI 24.06 kg/m    Physical Exam Constitutional:      General: She is not in acute distress.    Appearance: Normal appearance.  Cardiovascular:     Rate and Rhythm: Normal rate and regular rhythm.     Heart sounds: Normal heart sounds. No murmur heard.    No friction rub. No gallop.  Pulmonary:     Effort: Pulmonary effort is normal. No respiratory distress.      Breath sounds: Normal breath sounds.  Abdominal:     Tenderness: There is no right CVA tenderness or left CVA tenderness.     Comments: No suprapubic tenderness  Musculoskeletal:        General: No swelling.  Skin:    General: Skin is warm and dry.  Neurological:     General: No focal deficit present.     Mental Status: She is alert.  Psychiatric:        Mood and Affect: Mood normal.        Behavior: Behavior normal.        Thought Content: Thought content normal.     Results for orders placed or performed in visit on 01/30/24  POCT Urinalysis Dipstick  Result Value Ref Range   Color, UA yellow    Clarity, UA clear    Glucose, UA Negative Negative   Bilirubin, UA Negative    Ketones, UA Negative    Spec Grav, UA 1.020 1.010 - 1.025   Blood, UA Negative    pH, UA 5.5 5.0 - 8.0   Protein, UA Negative Negative   Urobilinogen, UA 0.2 0.2 or 1.0 E.U./dL   Nitrite, UA Negative    Leukocytes, UA Trace (A) Negative   Appearance     Odor      Last thyroid  functions Lab Results  Component Value Date   TSH 0.144 (L)  01/23/2024   T3TOTAL 72 12/05/2020   T4TOTAL 10.9 03/02/2023      The 10-year ASCVD risk score (Arnett DK, et al., 2019) is: 49.5%    Assessment & Plan:   Dysuria Assessment & Plan: UA positive for trace WBC. Given that patient is symptomatic, will treat with Keflex  500 mg TID x 5 days. Advised patient to follow up if symptoms worsen or do not improve with abx.  Orders: -     POCT urinalysis dipstick  Hypothyroidism, unspecified type Assessment & Plan: TSH levels now low at 0.144 with 88 mcg dose levothyroxine .  - Continue 75 micrograms of thyroid  medication. - Recheck thyroid  function in 3-4 months.   Vulvovaginal candidiasis Assessment & Plan: Patient prefers cream over oral fluconazole . Micoconazole cream sent to pharmacy    Corn of toe Assessment & Plan: Corn present on left great toe. Safe use of corn pads with salicylic acid due to intact  sensation in feet. - Use corn pads with salicylic acid, remove at night, check for sores or wounds.    Other orders -     Cephalexin ; Take 1 capsule (500 mg total) by mouth 3 (three) times daily.  Dispense: 15 capsule; Refill: 0 -     Miconazole  Nitrate; Apply 1 Application topically 2 (two) times daily.  Dispense: 28.35 g; Refill: 0   Return in about 4 months (around 05/31/2024) for DM, HLD, thyroid .    Saddie JULIANNA Sacks, PA-C

## 2024-01-30 NOTE — Assessment & Plan Note (Signed)
 UA positive for trace WBC. Given that patient is symptomatic, will treat with Keflex  500 mg TID x 5 days. Advised patient to follow up if symptoms worsen or do not improve with abx.

## 2024-01-30 NOTE — Assessment & Plan Note (Signed)
 TSH levels now low at 0.144 with 88 mcg dose levothyroxine .  - Continue 75 micrograms of thyroid  medication. - Recheck thyroid  function in 3-4 months.

## 2024-01-30 NOTE — Assessment & Plan Note (Signed)
 Patient prefers cream over oral fluconazole . Micoconazole cream sent to pharmacy

## 2024-01-30 NOTE — Assessment & Plan Note (Signed)
 Corn present on left great toe. Safe use of corn pads with salicylic acid due to intact sensation in feet. - Use corn pads with salicylic acid, remove at night, check for sores or wounds.

## 2024-01-30 NOTE — Patient Instructions (Signed)
 VISIT SUMMARY: You came in today for a follow-up on your lab results and to discuss your thyroid  medication. We also addressed a foot lesion, urinary symptoms, gastrointestinal symptoms, and your diabetes management.  YOUR PLAN: -RECURRENT VULVOVAGINAL CANDIDIASIS: This is a yeast infection that causes burning and occasional itching. If your symptoms persist, consider using an antifungal cream as you have done in the past.  -URINARY TRACT INFECTION, RULE OUT: Your urine was positive for white blood cells, which usually indicates bacteria in the urinary tract. Given that you are currently experiencing symptoms, we will treat this with an antibiotic. I have sent in Keflex  500 mg three times per day for 5 days. Be sure to take this medication with food to avoid upset stomach. I have also sent in a vaginal cream to treat yeast infection.  -HYPOTHYROIDISM: Your thyroid  is functioning slower than normal, which is consistent with hypothyroidism. Continue taking your 75 mcg of thyroid  medication daily, and we will recheck your thyroid  function in 3-4 months.  -CORNS OF TOE: You have a corn on your toe. You can safely use corn pads with salicylic acid, but make sure to remove them at night and check your feet for any sores or wounds. If you would like to see a podiatrist for this, please let me know and I will place the referral.  INSTRUCTIONS: Please follow up in 3-4 months for a recheck of your thyroid  function. Additionally, we will test your urine to rule out a urinary tract infection.  If you have any problems before your next visit feel free to message me via MyChart (minor issues or questions) or call the office, otherwise you may reach out to schedule an office visit.  Thank you! Saddie Sacks, PA-C

## 2024-04-11 ENCOUNTER — Other Ambulatory Visit: Payer: Self-pay | Admitting: Family Medicine

## 2024-04-11 DIAGNOSIS — I152 Hypertension secondary to endocrine disorders: Secondary | ICD-10-CM

## 2024-04-13 NOTE — Progress Notes (Signed)
 Amanda Douglas                                          MRN: [id number]   04/13/2024   The [facility] Quality Team Specialist reviewed this patient medical record for the purposes of chart review for care gap closure. The following were reviewed: abstraction for care gap closure-kidney health evaluation for diabetes:eGFR  and uACR.    [facility] Quality Team

## 2024-04-26 ENCOUNTER — Other Ambulatory Visit: Payer: Self-pay

## 2024-04-26 DIAGNOSIS — E118 Type 2 diabetes mellitus with unspecified complications: Secondary | ICD-10-CM

## 2024-05-01 ENCOUNTER — Other Ambulatory Visit: Payer: Self-pay | Admitting: Family Medicine

## 2024-05-01 ENCOUNTER — Telehealth: Payer: Self-pay

## 2024-05-01 NOTE — Telephone Encounter (Signed)
 A prescription to [facility] was sent in on the 13th

## 2024-05-01 NOTE — Telephone Encounter (Signed)
 Copied from CRM [id number]. Topic: Clinical - Prescription Issue >> May 01, 2024  3:49 PM Lonell PEDLAR wrote: Reason for CRM: Patient called regarding rx refill for OZEMPIC , 2 MG/DOSE, 8 MG/3ML SOPN, her last dose is Thursday, please advise.

## 2024-05-02 NOTE — Telephone Encounter (Signed)
 Called pt  LVM.

## 2024-05-03 NOTE — Telephone Encounter (Signed)
 Called patient no response on both numbers

## 2024-05-04 NOTE — Telephone Encounter (Signed)
 The patient called in to say thanks and she did finally get her ozempic 

## 2024-05-04 NOTE — Telephone Encounter (Signed)
3rd attempt: went to VM

## 2024-05-17 ENCOUNTER — Other Ambulatory Visit: Payer: Self-pay

## 2024-05-17 DIAGNOSIS — E118 Type 2 diabetes mellitus with unspecified complications: Secondary | ICD-10-CM

## 2024-05-17 DIAGNOSIS — E1169 Type 2 diabetes mellitus with other specified complication: Secondary | ICD-10-CM

## 2024-05-17 DIAGNOSIS — I152 Hypertension secondary to endocrine disorders: Secondary | ICD-10-CM

## 2024-05-17 DIAGNOSIS — E039 Hypothyroidism, unspecified: Secondary | ICD-10-CM

## 2024-05-24 ENCOUNTER — Other Ambulatory Visit

## 2024-05-24 DIAGNOSIS — E1169 Type 2 diabetes mellitus with other specified complication: Secondary | ICD-10-CM

## 2024-05-24 DIAGNOSIS — E039 Hypothyroidism, unspecified: Secondary | ICD-10-CM

## 2024-05-24 DIAGNOSIS — I152 Hypertension secondary to endocrine disorders: Secondary | ICD-10-CM

## 2024-05-24 DIAGNOSIS — E118 Type 2 diabetes mellitus with unspecified complications: Secondary | ICD-10-CM

## 2024-05-25 LAB — COMPREHENSIVE METABOLIC PANEL WITH GFR
ALT: 23 IU/L (ref 0–32)
AST: 21 IU/L (ref 0–40)
Albumin: 4.6 g/dL (ref 3.8–4.8)
Alkaline Phosphatase: 84 IU/L (ref 49–135)
BUN/Creatinine Ratio: 16 (ref 12–28)
BUN: 11 mg/dL (ref 8–27)
Bilirubin Total: 0.8 mg/dL (ref 0.0–1.2)
CO2: 25 mmol/L (ref 20–29)
Calcium: 9.2 mg/dL (ref 8.7–10.3)
Chloride: 102 mmol/L (ref 96–106)
Creatinine, Ser: 0.7 mg/dL (ref 0.57–1.00)
Globulin, Total: 2 g/dL (ref 1.5–4.5)
Glucose: 136 mg/dL — ABNORMAL HIGH (ref 70–99)
Potassium: 4.2 mmol/L (ref 3.5–5.2)
Sodium: 140 mmol/L (ref 134–144)
Total Protein: 6.6 g/dL (ref 6.0–8.5)
eGFR: 88 mL/min/1.73 (ref >59)

## 2024-05-25 LAB — CBC WITH DIFFERENTIAL/PLATELET
Basophils Absolute: 0.1 x10E3/uL (ref 0.0–0.2)
Basos: 1 %
EOS (ABSOLUTE): 0 x10E3/uL (ref 0.0–0.4)
Eos: 1 %
Hematocrit: 43.7 % (ref 34.0–46.6)
Hemoglobin: 14 g/dL (ref 11.1–15.9)
Immature Grans (Abs): 0 x10E3/uL (ref 0.0–0.1)
Immature Granulocytes: 0 %
Lymphocytes Absolute: 1.8 x10E3/uL (ref 0.7–3.1)
Lymphs: 24 %
MCH: 29.8 pg (ref 26.6–33.0)
MCHC: 32 g/dL (ref 31.5–35.7)
MCV: 93 fL (ref 79–97)
Monocytes Absolute: 0.5 x10E3/uL (ref 0.1–0.9)
Monocytes: 7 %
Neutrophils Absolute: 5.1 x10E3/uL (ref 1.4–7.0)
Neutrophils: 67 %
Platelets: 275 x10E3/uL (ref 150–450)
RBC: 4.7 x10E6/uL (ref 3.77–5.28)
RDW: 12.6 % (ref 11.7–15.4)
WBC: 7.5 x10E3/uL (ref 3.4–10.8)

## 2024-05-25 LAB — THYROID PANEL WITH TSH
Free Thyroxine Index: 3 (ref 1.2–4.9)
T3 Uptake Ratio: 30 % (ref 24–39)
T4, Total: 10 ug/dL (ref 4.5–12.0)
TSH: 1.23 u[IU]/mL (ref 0.450–4.500)

## 2024-05-25 LAB — HEMOGLOBIN A1C
Est. average glucose Bld gHb Est-mCnc: 183 mg/dL
Hgb A1c MFr Bld: 8 % — ABNORMAL HIGH (ref 4.8–5.6)

## 2024-05-25 LAB — LIPID PANEL
Chol/HDL Ratio: 2.9 ratio (ref 0.0–4.4)
Cholesterol, Total: 172 mg/dL (ref 100–199)
HDL: 60 mg/dL (ref >39)
LDL Chol Calc (NIH): 80 mg/dL (ref 0–99)
Triglycerides: 191 mg/dL — ABNORMAL HIGH (ref 0–149)
VLDL Cholesterol Cal: 32 mg/dL (ref 5–40)

## 2024-05-27 ENCOUNTER — Ambulatory Visit: Payer: Self-pay

## 2024-05-31 ENCOUNTER — Ambulatory Visit

## 2024-05-31 VITALS — BP 134/72 | HR 80 | Temp 97.8°F | Ht 66.0 in | Wt 151.0 lb

## 2024-05-31 DIAGNOSIS — E785 Hyperlipidemia, unspecified: Secondary | ICD-10-CM

## 2024-05-31 DIAGNOSIS — R109 Unspecified abdominal pain: Secondary | ICD-10-CM

## 2024-05-31 DIAGNOSIS — E1169 Type 2 diabetes mellitus with other specified complication: Secondary | ICD-10-CM

## 2024-05-31 DIAGNOSIS — Z7985 Long-term (current) use of injectable non-insulin antidiabetic drugs: Secondary | ICD-10-CM | POA: Diagnosis not present

## 2024-05-31 DIAGNOSIS — Z7984 Long term (current) use of oral hypoglycemic drugs: Secondary | ICD-10-CM

## 2024-05-31 DIAGNOSIS — I152 Hypertension secondary to endocrine disorders: Secondary | ICD-10-CM | POA: Diagnosis not present

## 2024-05-31 DIAGNOSIS — E118 Type 2 diabetes mellitus with unspecified complications: Secondary | ICD-10-CM

## 2024-05-31 DIAGNOSIS — E1159 Type 2 diabetes mellitus with other circulatory complications: Secondary | ICD-10-CM

## 2024-05-31 DIAGNOSIS — K219 Gastro-esophageal reflux disease without esophagitis: Secondary | ICD-10-CM

## 2024-05-31 DIAGNOSIS — E039 Hypothyroidism, unspecified: Secondary | ICD-10-CM

## 2024-05-31 MED ORDER — DICYCLOMINE HCL 10 MG PO CAPS: 10.0000 mg | ORAL_CAPSULE | Freq: Three times a day (TID) | ORAL | 2 refills | Status: AC

## 2024-05-31 MED ORDER — SUCRALFATE 1 G PO TABS: 1.0000 g | ORAL_TABLET | Freq: Three times a day (TID) | ORAL | 0 refills | Status: AC | PRN

## 2024-05-31 MED ORDER — LEVOTHYROXINE SODIUM 75 MCG PO TABS: 75.0000 ug | ORAL_TABLET | Freq: Every day | ORAL | 1 refills | Status: AC

## 2024-05-31 NOTE — Patient Instructions (Signed)
 It was nice to see you today!  As we discussed in clinic:  -I Have sent in Jardiance  10 mg for your A1c  - I have also sent in Bentyl  (dicyclomine ) to take as needed for muscle spasms  - I have also sent in a refill on your thyroid  medicine.   - Other than elevated A1c, your lab work looked great and you are up-to-date on all health screenings except eye exam.  - Please schedule a diabetic eye exam with [facility] in the new year   Amanda Douglas   If you have any problems before your next visit feel free to message me via MyChart (minor issues or questions) or call the office, otherwise you may reach out to schedule an office visit.  Thank you! Saddie Sacks, PA-C

## 2024-06-01 DIAGNOSIS — R109 Unspecified abdominal pain: Secondary | ICD-10-CM | POA: Insufficient documentation

## 2024-06-01 NOTE — Assessment & Plan Note (Signed)
 BP goal <130/80.  Stable and at goal.  Continue managing with nonpharmacologic therapy.  Will continue to monitor.

## 2024-06-01 NOTE — Progress Notes (Signed)
 "  Established Patient Office Visit  Subjective   Patient ID: Amanda  LITTIE Douglas, female    DOB: 09-May-1946  Age: 78 y.o. MRN: [id number]  Chief Complaint  Patient presents with   Medical Management of Chronic Issues    HPI  History of Present Illness   Amanda Douglas is a 78 year old female with type 2 diabetes and hypothyroidism who presents for a follow-up visit to review lab results and medication refills.  Glycemic control and diabetes management - Type 2 diabetes managed with Ozempic   - Recent increase in hemoglobin A1c from 7.2% to 8.0%. - Previously used Jardiance , discontinued due to 'yeasty' urine odor. Denies previously symptoms of dysuria/UTI when on this medication -- just did not like the smell of her urine.  - No diabetic eye exam in the past three years. - Monitors feet at home; no numbness or tingling reported. - Mindful of carbohydrate intake; does not consume a diet high in bread or desserts.  Hypothyroidism - Current levothyroxine  dose is 75 micrograms daily.  Gastrointestinal symptoms - Experiences extreme gas and stomach cramps, described as 'spastic stomach' and similar to 'labor pains.' - History of gastritis. - Finds sucralfate  helpful for symptoms. - Previously took Bentyl  approximately five years ago for similar symptoms.          ROS Per HPI.    Objective:     BP 134/72   Pulse 80   Temp 97.8 F (36.6 C) (Oral)   Ht 5' 6 (1.676 m)   Wt 151 lb 0.6 oz (68.5 kg)   SpO2 96%   BMI 24.38 kg/m    Physical Exam Constitutional:      General: She is not in acute distress.    Appearance: Normal appearance.  Cardiovascular:     Rate and Rhythm: Normal rate and regular rhythm.     Heart sounds: Normal heart sounds. No murmur heard.    No friction rub. No gallop.  Pulmonary:     Effort: Pulmonary effort is normal. No respiratory distress.     Breath sounds: Normal breath sounds.  Abdominal:     General: Bowel sounds are normal.      Palpations: Abdomen is soft.     Tenderness: There is no abdominal tenderness.  Musculoskeletal:        General: No swelling.     Cervical back: Neck supple.  Lymphadenopathy:     Cervical: No cervical adenopathy.  Skin:    General: Skin is warm and dry.  Neurological:     General: No focal deficit present.     Mental Status: She is alert.  Psychiatric:        Mood and Affect: Mood normal.        Behavior: Behavior normal.        Thought Content: Thought content normal.      No results found for any visits on 05/31/24.  Last CBC Lab Results  Component Value Date   WBC 7.5 05/24/2024   HGB 14.0 05/24/2024   HCT 43.7 05/24/2024   MCV 93 05/24/2024   MCH 29.8 05/24/2024   RDW 12.6 05/24/2024   PLT 275 05/24/2024   Last metabolic panel Lab Results  Component Value Date   GLUCOSE 136 (H) 05/24/2024   NA 140 05/24/2024   K 4.2 05/24/2024   CL 102 05/24/2024   CO2 25 05/24/2024   BUN 11 05/24/2024   CREATININE 0.70 05/24/2024   EGFR 88 05/24/2024   CALCIUM  9.2 05/24/2024  PROT 6.6 05/24/2024   ALBUMIN 4.6 05/24/2024   LABGLOB 2.0 05/24/2024   AGRATIO 2.2 11/04/2022   BILITOT 0.8 05/24/2024   ALKPHOS 84 05/24/2024   AST 21 05/24/2024   ALT 23 05/24/2024   Last lipids Lab Results  Component Value Date   CHOL 172 05/24/2024   HDL 60 05/24/2024   LDLCALC 80 05/24/2024   TRIG 191 (H) 05/24/2024   CHOLHDL 2.9 05/24/2024   Last hemoglobin A1c Lab Results  Component Value Date   HGBA1C 8.0 (H) 05/24/2024   Last thyroid  functions Lab Results  Component Value Date   TSH 1.230 05/24/2024   T3TOTAL 72 12/05/2020   T4TOTAL 10.0 05/24/2024   FREET4 1.83 (H) 01/23/2024   Last vitamin D  Lab Results  Component Value Date   VD25OH 56.9 10/01/2020      The 10-year ASCVD risk score (Arnett DK, et al., 2019) is: 45.1%    Assessment & Plan:   Type 2 diabetes mellitus with complication (HCC) Assessment & Plan: A1c goal < 7.0. A1c increased from 7.2 to 8.0.  Proposed restarting Jardiance  at 10 mg to improve control. - Restart Jardiance  at 10 mg daily. - Continue Ozempic . - Recheck A1c in 3-4 months. - Foot exam UTD today WNL; UACR UTD. - Schedule diabetic eye exam in new year.  Orders: -     Empagliflozin ; Take 1 tablet (10 mg total) by mouth daily.  Dispense: 90 tablet; Refill: 3  Gastroesophageal reflux disease, unspecified whether esophagitis present -     Sucralfate ; Take 1 tablet (1 g total) by mouth 3 (three) times daily as needed.  Dispense: 90 tablet; Refill: 0  Hypothyroidism, unspecified type Assessment & Plan: Thyroid  panel now within normal limits. Continue levothyroxine  75 mcg daily. Will cont to monitor.   Hypertension associated with diabetes (HCC) Assessment & Plan: BP goal <130/80. Stable and at goal. Continue managing with nonpharmacologic therapy. Will continue to monitor.    Hyperlipidemia associated with type 2 diabetes mellitus (HCC) Assessment & Plan: Last lipid panel: LDL 80, HDL 60, Trig 191. Continue rosuvastatin  20 mg daily. If LDL continues to increase, will need to increase rosuvastatin  to 40 mg as her LDL goal is <70.    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  Bentyl  re-prescribed for as needed use. Refill sulcrafate.    Other orders -     Levothyroxine  Sodium; Take 1 tablet (75 mcg total) by mouth daily before breakfast.  Dispense: 90 tablet; Refill: 1 -     Dicyclomine  HCl; Take 1 capsule (10 mg total) by mouth 4 (four) times daily -  before meals and at bedtime.  Dispense: 30 capsule; Refill: 2     Return in about 4 months (around 09/29/2024) for DM.    Saddie JULIANNA Sacks, PA-C "

## 2024-06-01 NOTE — Assessment & Plan Note (Signed)
 A1c goal < 7.0. A1c increased from 7.2 to 8.0. Proposed restarting Jardiance  at 10 mg to improve control. - Restart Jardiance  at 10 mg daily. - Continue Ozempic . - Recheck A1c in 3-4 months. - Foot exam UTD today WNL; UACR UTD. - Schedule diabetic eye exam in new year.

## 2024-06-01 NOTE — Assessment & Plan Note (Signed)
 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  Bentyl  re-prescribed for as needed use. Refill sulcrafate.

## 2024-06-01 NOTE — Assessment & Plan Note (Signed)
 Last lipid panel: LDL 80, HDL 60, Trig 191. Continue rosuvastatin  20 mg daily. If LDL continues to increase, will need to increase rosuvastatin  to 40 mg as her LDL goal is <70.

## 2024-06-01 NOTE — Assessment & Plan Note (Signed)
 Thyroid  panel now within normal limits. Continue levothyroxine  75 mcg daily. Will cont to monitor.

## 2024-10-01 ENCOUNTER — Ambulatory Visit

## 2025-01-17 ENCOUNTER — Ambulatory Visit
